# Patient Record
Sex: Male | Born: 1955
Health system: Southern US, Community
[De-identification: ages and names within clinical notes are randomized; demographics above are authoritative.]

## PROBLEM LIST (undated history)

## (undated) DIAGNOSIS — E119 Type 2 diabetes mellitus without complications: Secondary | ICD-10-CM

## (undated) DIAGNOSIS — E785 Hyperlipidemia, unspecified: Secondary | ICD-10-CM

## (undated) DIAGNOSIS — E1140 Type 2 diabetes mellitus with diabetic neuropathy, unspecified: Secondary | ICD-10-CM

## (undated) HISTORY — DX: Type 2 diabetes mellitus with diabetic neuropathy, unspecified: E11.40

## (undated) HISTORY — DX: Hyperlipidemia, unspecified: E78.5

## (undated) HISTORY — DX: Type 2 diabetes mellitus without complications: E11.9

## (undated) HISTORY — PX: CYST REMOVAL TRUNK: SHX6283

---

## 1978-02-10 HISTORY — PX: APPENDECTOMY: SHX54

## 2011-03-25 DIAGNOSIS — E785 Hyperlipidemia, unspecified: Secondary | ICD-10-CM | POA: Insufficient documentation

## 2011-03-25 DIAGNOSIS — E1169 Type 2 diabetes mellitus with other specified complication: Secondary | ICD-10-CM | POA: Insufficient documentation

## 2015-03-27 DIAGNOSIS — E119 Type 2 diabetes mellitus without complications: Secondary | ICD-10-CM | POA: Insufficient documentation

## 2017-03-06 NOTE — Progress Notes (Signed)
Subjective: HU:TMLYYTKPT care, DM2 HPI: Anthony French is a 62 y.o. male presenting to clinic today for:  1. Type 2 Diabetes:  Diagnosed in 2004.  Patient reports: Glucometer: One Touch ultra mini, he has had this for over 2 years, High at home: 230; Low at home: 160, fasting morning blood sugar typically between 160 and 230.  Taking medication(s): Novolin 70/30.  He is injecting 22 units each morning and 24 units each evening, metformin 500 mg twice dailyside effects: None.  No hypoglycemic episodes  Last eye exam: Greater than 3 years Last foot exam: Greater than 1 year Last A1c: Greater than 3 months, unsure of value Nephropathy screen indicated?:  He is not on an ACE or an arm.  He does need a urine microalbumin. Last flu, zoster and/or pneumovax: Unsure.  ROS: denies fever, chills, dizziness, LOC, polyuria, polydipsia, unintended weight loss/gain, foot ulcerations.  He does report numbness/ tingling in extremities. denies chest pain.  2.  Athlete's foot Patient reports that he has been on oral Lamisil for almost a year now.  Unsure of last LFT check.  He reports improvement in the foot infection.  Denies any abdominal pain, nausea, vomiting, jaundice or scleral icterus.  3. Finger problem Patient notes that his middle finger on the left hand and his 2 fingers on his right hand often get stuck such that he has to open his hand back up himself.  He notes that this is painful when this happens.  He reports he frequently works with his hands.  Denies preceding injury, numbness, tingling, swelling.   Past Medical History:  Diagnosis Date  . Diabetes ([place])   . Diabetic neuropathy [facility])    Past Surgical History:  Procedure Laterality Date  . APPENDECTOMY  1980  . CYST REMOVAL TRUNK     Social History   Socioeconomic History  . Marital status: Single    Spouse name: Not on file  . Number of children: Not on file  . Years of education: Not on file  . Highest education level: Not  on file  Social Needs  . Financial resource strain: Not on file  . Food insecurity - worry: Not on file  . Food insecurity - inability: Not on file  . Transportation needs - medical: Not on file  . Transportation needs - non-medical: Not on file  Occupational History  . Not on file  Tobacco Use  . Smoking status: Never Smoker  . Smokeless tobacco: Never Used  Substance and Sexual Activity  . Alcohol use: Not on file  . Drug use: Not on file  . Sexual activity: Not on file  Other Topics Concern  . Not on file  Social History Narrative  . Not on file   Current Meds  Medication Sig  . gabapentin (NEURONTIN) 300 MG capsule Take 2 mg by mouth 3 (three) times daily.   . insulin NPH-regular Human (NOVOLIN 70/30) (70-30) 100 UNIT/ML injection INJECT 22 UNITS UNDER THE SKIN WITH BREAKFAST AND 24 UNITS WITH SUPPER  . metFORMIN (GLUCOPHAGE) 500 MG tablet Take 500 mg by mouth 2 (two) times daily.   . [DISCONTINUED] terbinafine (LAMISIL) 250 MG tablet Take 250 mg by mouth daily.    Family History  Problem Relation Age of Onset  . Stroke Mother   . COPD Father   . Diabetes Sister   . Stroke Brother   . Diabetes Brother    Allergies  Allergen Reactions  . Other Hives    Pt states  that he is allergic to an ABT but he can not remember what it is     ROS: Per HPI  Objective: Office vital signs reviewed. BP 128/78   Pulse 62   Temp (!) 97.1 F (36.2 C) (Oral)   Ht 5' 9"  (1.753 m)   Wt 182 lb (82.6 kg)   BMI 26.88 kg/m   Physical Examination:  General: Awake, alert, well nourished, No acute distress HEENT: MMM, sclera white Cardio: regular rate and rhythm, S1S2 heard, no murmurs appreciated Pulm: clear to auscultation bilaterally, no wheezes, rhonchi or rales; normal work of breathing on room air Extremities: warm, well perfused, No edema, cyanosis or clubbing; +2 pulses bilaterally MSK: normal gait and normal station; left middle finger with palpable nodule within the sheath  along the pulley system.  Finger does not become stuck during our exam today  Skin: dry; intact; no rashes or lesions; mild onychomycosis appreciated at the distal aspects of bilateral great toes.  He has some dry skin along the heel but no cracking or evidence of secondary infection. Neuro: see DM foot exam below Diabetic Foot Form - Detailed   Diabetic Foot Exam - detailed Diabetic Foot exam was performed with the following findings:  Yes 03/13/2017  4:24 PM  Visual Foot Exam completed.:  Yes  Can the patient see the bottom of their feet?:  Yes Are the shoes appropriate in style and fit?:  Yes Is there swelling or and abnormal foot shape?:  No Is there a claw toe deformity?:  No Is there elevated skin temparature?:  No Is there foot or ankle muscle weakness?:  Yes Normal Range of Motion:  No Pulse Foot Exam completed.:  Yes  Right posterior Tibialias:  Diminished Left posterior Tibialias:  Diminished  Right Dorsalis Pedis:  Diminished Left Dorsalis Pedis:  Diminished  Sensory Foot Exam Completed.:  Yes Semmes-Weinstein Monofilament Test R Site 1-Great Toe:  Pos L Site 1-Great Toe:  Pos        Psych: Mood stable, speech normal, affect appropriate, pleasant Depression screen PHQ 2/9 03/13/2017  Decreased Interest 0  Down, Depressed, Hopeless 0  PHQ - 2 Score 0    Assessment/ Plan: 62 y.o. male   1. Type 2 diabetes mellitus with hyperglycemia, with long-term current use of insulin (HCC) Likely uncontrolled, given reports of blood sugars 160s-200s.  He has only recently become compliant with insulin regimen.  If he has normal renal function, we will plan to increase the metformin to 1000 mg twice daily prior to increasing insulin if able.  Patient seemed amenable to this.  Check CMP, lipid panel, A1c.  Patient to add a daily aspirin.  Lipitor 40 mg also added to regimen.  Diabetic shoes prescribed.  Patient will bring Rx to pharmacy to fill.  He will follow-up in 1 month for continued  management of diabetes.  For now, continue monitoring blood sugar twice a day and record.  He will bring this to next visit.  New meter and strips prescribed. - CMP14+EGFR - Lipid Panel - Bayer DCA Hb A1c Waived  2. Encounter to establish care with new doctor  3. Foot callus See above  4. Diabetic polyneuropathy associated with type 2 diabetes mellitus ([place]) See above.  He is already on Neurontin 300 mg 3 times daily  5. Screening for HIV without presence of risk factors - HIV antibody  6. Encounter for hepatitis C screening test for low risk patient - Hepatitis C antibody  7. Screening for  deficiency anemia - CBC with Differential  8. Tinea pedis of both feet He reports having been on Lamisil for greater than 1 year.  We discussed that typical treatment intervals is no longer than 3 months.  He certainly does not sound like he has been having regular CMP/liver function tests.  I advised that he discontinue use of Lamisil.  Will check CMP today.  9. Trigger middle finger of left hand Clinically consistent with trigger finger.  We discussed that treatment could entail corticosteroid injection.  I did inform him that I do not perform this procedure but I am happy to refer him to orthopedic or another provider within our office who may perform this.  He voices good understanding and will follow-up if needed for this.  Janora Norlander, DO [place] [phone]

## 2017-03-13 ENCOUNTER — Ambulatory Visit: Payer: PRIVATE HEALTH INSURANCE | Admitting: Family Medicine

## 2017-03-13 ENCOUNTER — Encounter: Payer: Self-pay | Admitting: Family Medicine

## 2017-03-13 VITALS — BP 128/78 | HR 62 | Temp 97.1°F | Ht 69.0 in | Wt 182.0 lb

## 2017-03-13 DIAGNOSIS — M65332 Trigger finger, left middle finger: Secondary | ICD-10-CM | POA: Insufficient documentation

## 2017-03-13 DIAGNOSIS — Z1159 Encounter for screening for other viral diseases: Secondary | ICD-10-CM | POA: Diagnosis not present

## 2017-03-13 DIAGNOSIS — E1142 Type 2 diabetes mellitus with diabetic polyneuropathy: Secondary | ICD-10-CM | POA: Diagnosis not present

## 2017-03-13 DIAGNOSIS — Z7689 Persons encountering health services in other specified circumstances: Secondary | ICD-10-CM

## 2017-03-13 DIAGNOSIS — B353 Tinea pedis: Secondary | ICD-10-CM | POA: Diagnosis not present

## 2017-03-13 DIAGNOSIS — L84 Corns and callosities: Secondary | ICD-10-CM

## 2017-03-13 DIAGNOSIS — Z114 Encounter for screening for human immunodeficiency virus [HIV]: Secondary | ICD-10-CM

## 2017-03-13 DIAGNOSIS — Z794 Long term (current) use of insulin: Secondary | ICD-10-CM | POA: Diagnosis not present

## 2017-03-13 DIAGNOSIS — Z130 Encounter for screening for diseases of the blood and blood-forming organs and certain disorders involving the immune mechanism: Secondary | ICD-10-CM

## 2017-03-13 DIAGNOSIS — N289 Disorder of kidney and ureter, unspecified: Secondary | ICD-10-CM

## 2017-03-13 DIAGNOSIS — E1165 Type 2 diabetes mellitus with hyperglycemia: Secondary | ICD-10-CM

## 2017-03-13 LAB — BAYER DCA HB A1C WAIVED: HB A1C (BAYER DCA - WAIVED): 9.6 % — ABNORMAL HIGH (ref <7.0)

## 2017-03-13 MED ORDER — BLOOD GLUCOSE METER KIT: PACK | 0 refills | Status: AC

## 2017-03-13 MED ORDER — ATORVASTATIN CALCIUM 40 MG PO TABS
40.0000 mg | ORAL_TABLET | Freq: Every day | ORAL | 5 refills | Status: DC
Start: 2017-03-13 — End: 2017-10-29

## 2017-03-13 NOTE — Assessment & Plan Note (Signed)
Likely uncontrolled, given reports of blood sugars 160s-200s.  He has only recently become compliant with insulin regimen.  If he has normal renal function, we will plan to increase the metformin to 1000 mg twice daily prior to increasing insulin if able.  Patient seemed amenable to this.  For now continue current regimen.  Check CMP, lipid panel, A1c.  Patient to add a daily aspirin.  Lipitor 40 mg also added to regimen.  Diabetic shoes prescribed.  Patient will bring Rx to pharmacy to fill.  He will follow-up in 1 month for continued management of diabetes.  For now, continue monitoring blood sugar twice a day and record.  He will bring this to next visit.  New meter and strips prescribed.

## 2017-03-13 NOTE — Patient Instructions (Signed)
Plan to see me back in about 4 weeks for your blood sugar.  In the meantime make sure that you monitor your blood pressure twice daily as we discussed.  I have added cholesterol medication.  Please pick this up from the pharmacy.  I will contact you with results of your blood labs once they are available.   Diabetes and Foot Care Diabetes may cause you to have problems because of poor blood supply (circulation) to your feet and legs. This may cause the skin on your feet to become thinner, break easier, and heal more slowly. Your skin may become dry, and the skin may peel and crack. You may also have nerve damage in your legs and feet causing decreased feeling in them. You may not notice minor injuries to your feet that could lead to infections or more serious problems. Taking care of your feet is one of the most important things you can do for yourself. Follow these instructions at home:  Wear shoes at all times, even in the house. Do not go barefoot. Bare feet are easily injured.  Check your feet daily for blisters, cuts, and redness. If you cannot see the bottom of your feet, use a mirror or ask someone for help.  Wash your feet with warm water (do not use hot water) and mild soap. Then pat your feet and the areas between your toes until they are completely dry. Do not soak your feet as this can dry your skin.  Apply a moisturizing lotion or petroleum jelly (that does not contain alcohol and is unscented) to the skin on your feet and to dry, brittle toenails. Do not apply lotion between your toes.  Trim your toenails straight across. Do not dig under them or around the cuticle. File the edges of your nails with an emery board or nail file.  Do not cut corns or calluses or try to remove them with medicine.  Wear clean socks or stockings every day. Make sure they are not too tight. Do not wear knee-high stockings since they may decrease blood flow to your legs.  Wear shoes that fit properly  and have enough cushioning. To break in new shoes, wear them for just a few hours a day. This prevents you from injuring your feet. Always look in your shoes before you put them on to be sure there are no objects inside.  Do not cross your legs. This may decrease the blood flow to your feet.  If you find a minor scrape, cut, or break in the skin on your feet, keep it and the skin around it clean and dry. These areas may be cleansed with mild soap and water. Do not cleanse the area with peroxide, alcohol, or iodine.  When you remove an adhesive bandage, be sure not to damage the skin around it.  If you have a wound, look at it several times a day to make sure it is healing.  Do not use heating pads or hot water bottles. They may burn your skin. If you have lost feeling in your feet or legs, you may not know it is happening until it is too late.  Make sure your health care provider performs a complete foot exam at least annually or more often if you have foot problems. Report any cuts, sores, or bruises to your health care provider immediately. Contact a health care provider if:  You have an injury that is not healing.  You have cuts or breaks in  the skin.  You have an ingrown nail.  You notice redness on your legs or feet.  You feel burning or tingling in your legs or feet.  You have pain or cramps in your legs and feet.  Your legs or feet are numb.  Your feet always feel cold. Get help right away if:  There is increasing redness, swelling, or pain in or around a wound.  There is a red line that goes up your leg.  Pus is coming from a wound.  You develop a fever or as directed by your health care provider.  You notice a bad smell coming from an ulcer or wound. This information is not intended to replace advice given to you by your health care provider. Make sure you discuss any questions you have with your health care provider. Document Released: 01/25/2000 Document Revised:  07/05/2015 Document Reviewed: 07/06/2012 Elsevier Interactive Patient Education  2017 [employer].

## 2017-03-14 LAB — CMP14+EGFR
A/G RATIO: 2.1 (ref 1.2–2.2)
ALK PHOS: 82 IU/L (ref 39–117)
ALT: 14 IU/L (ref 0–44)
AST: 14 IU/L (ref 0–40)
Albumin: 4.2 g/dL (ref 3.6–4.8)
BUN/Creatinine Ratio: 16 (ref 10–24)
BUN: 22 mg/dL (ref 8–27)
Bilirubin Total: 0.4 mg/dL (ref 0.0–1.2)
CALCIUM: 9 mg/dL (ref 8.6–10.2)
CO2: 21 mmol/L (ref 20–29)
Chloride: 105 mmol/L (ref 96–106)
Creatinine, Ser: 1.37 mg/dL — ABNORMAL HIGH (ref 0.76–1.27)
GFR calc Af Amer: 63 mL/min/{1.73_m2} (ref >59)
GFR, EST NON AFRICAN AMERICAN: 55 mL/min/{1.73_m2} — AB (ref >59)
Globulin, Total: 2 g/dL (ref 1.5–4.5)
Glucose: 242 mg/dL — ABNORMAL HIGH (ref 65–99)
POTASSIUM: 5.2 mmol/L (ref 3.5–5.2)
SODIUM: 142 mmol/L (ref 134–144)
Total Protein: 6.2 g/dL (ref 6.0–8.5)

## 2017-03-14 LAB — CBC WITH DIFFERENTIAL/PLATELET
BASOS ABS: 0 10*3/uL (ref 0.0–0.2)
BASOS: 0 %
EOS (ABSOLUTE): 0.1 10*3/uL (ref 0.0–0.4)
EOS: 4 %
HEMATOCRIT: 42.5 % (ref 37.5–51.0)
HEMOGLOBIN: 14.4 g/dL (ref 13.0–17.7)
Immature Grans (Abs): 0 10*3/uL (ref 0.0–0.1)
Immature Granulocytes: 0 %
Lymphocytes Absolute: 1 10*3/uL (ref 0.7–3.1)
Lymphs: 29 %
MCH: 29 pg (ref 26.6–33.0)
MCHC: 33.9 g/dL (ref 31.5–35.7)
MCV: 86 fL (ref 79–97)
MONOCYTES: 8 %
Monocytes Absolute: 0.3 10*3/uL (ref 0.1–0.9)
NEUTROS ABS: 2.1 10*3/uL (ref 1.4–7.0)
Neutrophils: 59 %
Platelets: 124 10*3/uL — ABNORMAL LOW (ref 150–379)
RBC: 4.97 x10E6/uL (ref 4.14–5.80)
RDW: 14.4 % (ref 12.3–15.4)
WBC: 3.5 10*3/uL (ref 3.4–10.8)

## 2017-03-14 LAB — LIPID PANEL
CHOL/HDL RATIO: 4 ratio (ref 0.0–5.0)
CHOLESTEROL TOTAL: 166 mg/dL (ref 100–199)
HDL: 42 mg/dL (ref >39)
LDL Calculated: 99 mg/dL (ref 0–99)
TRIGLYCERIDES: 123 mg/dL (ref 0–149)
VLDL Cholesterol Cal: 25 mg/dL (ref 5–40)

## 2017-03-14 LAB — HEPATITIS C ANTIBODY

## 2017-03-14 LAB — HIV ANTIBODY (ROUTINE TESTING W REFLEX): HIV Screen 4th Generation wRfx: NONREACTIVE

## 2017-03-23 ENCOUNTER — Other Ambulatory Visit: Payer: Self-pay | Admitting: Family Medicine

## 2017-03-23 DIAGNOSIS — R7989 Other specified abnormal findings of blood chemistry: Secondary | ICD-10-CM

## 2017-03-23 NOTE — Addendum Note (Signed)
Addended byCarrolyn Leigh on: 03/23/2017 02:40 PM   Modules accepted: Orders

## 2017-04-14 NOTE — Progress Notes (Signed)
Subjective: CC: DM2 HPI: Anthony French is a 62 y.o. male presenting to clinic today for:  1. Type 2 Diabetes/ HLD:  Patient reports: Glucometer: Accucheck, High at home: 281; Low at home: 88, Taking medication(s): Metformin 500 mg p.o. twice daily and Novolin 70/30 22 units every morning and 25 units nightly, he notes good tolerance of the Lipitor which was added at last visit.  Side effects: none  Last eye exam: Needs to schedule Last foot exam: 03/2017 Last A1c: 9.6 last month Nephropathy screen indicated?: Needs Last flu, zoster and/or pneumovax: Needs PNA  Denies fever, chills, dizziness, LOC, polyuria, polydipsia, unintended weight loss/gain, foot ulcerations, or chest pain. He reports numbness or tingling in extremities.  He notes that he has not yet pick up his diabetic shoes but is planning on going today after our appointment.  2. Skin lesions Patient reports a crusty skin lesion on the right anterior and left posterior aspects of the neck.  He denies increasing in size, spontaneous bleeding, change in color, shape.  He notes that he has had similar lesions on his forearms that were freeze off in the past and is wondering if we can do that for him today.  3.  Colon cancer screening Patient has not had colon cancer screening performed.  Denies any rectal bleeding.  No unplanned weight loss.  He is a "picky eater" at baseline.  ROS: Per HPI  Past Medical History:  Diagnosis Date  . Diabetes ([place])   . Diabetic neuropathy (HCC)    Allergies  Allergen Reactions  . Other Hives    Pt states that he is allergic to an ABT but he can not remember what it is    Current Outpatient Medications:  .  atorvastatin (LIPITOR) 40 MG tablet, Take 1 tablet (40 mg total) by mouth daily., Disp: 30 tablet, Rfl: 5 .  blood glucose meter kit and supplies, Dispense based on patient and insurance preference. Use up to four times daily as directed. (FOR ICD-10 E10.9, E11.9). Check blood sugar twice  a day as directed., Disp: 1 each, Rfl: 0 .  gabapentin (NEURONTIN) 300 MG capsule, Take 2 mg by mouth 3 (three) times daily. , Disp: , Rfl:  .  insulin NPH-regular Human (NOVOLIN 70/30) (70-30) 100 UNIT/ML injection, INJECT 22 UNITS UNDER THE SKIN WITH BREAKFAST AND 24 UNITS WITH SUPPER, Disp: , Rfl:  .  metFORMIN (GLUCOPHAGE) 500 MG tablet, Take 500 mg by mouth 2 (two) times daily. , Disp: , Rfl:  Social History   Socioeconomic History  . Marital status: Single    Spouse name: Not on file  . Number of children: Not on file  . Years of education: Not on file  . Highest education level: Not on file  Social Needs  . Financial resource strain: Not on file  . Food insecurity - worry: Not on file  . Food insecurity - inability: Not on file  . Transportation needs - medical: Not on file  . Transportation needs - non-medical: Not on file  Occupational History  . Not on file  Tobacco Use  . Smoking status: Never Smoker  . Smokeless tobacco: Never Used  Substance and Sexual Activity  . Alcohol use: Not on file  . Drug use: Not on file  . Sexual activity: Not on file  Other Topics Concern  . Not on file  Social History Narrative  . Not on file   Family History  Problem Relation Age of Onset  . Stroke  Mother   . COPD Father   . Diabetes Sister   . Stroke Brother   . Diabetes Brother     Health Maintenance: colon cancer screening.  Objective: Office vital signs reviewed. BP 109/64   Pulse 61   Temp (!) 97.5 F (36.4 C) (Oral)   Ht 5' 9"  (1.753 m)   Wt 185 lb (83.9 kg)   BMI 27.32 kg/m   Physical Examination:  General: Awake, alert, thin male, No acute distress HEENT: sclera white, MMM Cardio: regular rate and rhythm, S1S2 heard, no murmurs appreciated Pulm: clear to auscultation bilaterally, no wheezes, rhonchi or rales; normal work of breathing on room air Skin: 1.75 centimeter x 1.25 centimeter crusting lesion that has no pigment and no associated erythema appreciated  on the right anterior aspect of the patient's neck.  A similar lesion that is 2 millimeters x 3 mm on the left posterior aspect of the neck noted as well.  No associated induration, exudate or bleeding.  Cryotherapy Procedure:  Risks and benefits of procedure were reviewed with the patient.  Written consent obtained and scanned into the chart.  Lesion of concern was identified and located on right anterior.  Liquid nitrogen was applied to area of concern and extending out 1.5 millimeters beyond the border of the lesion.  Treated area was allowed to come back to room temperature before treating it a second time.  Patient tolerated procedure well and there were no immediate complications.  Home care instructions were reviewed with the patient and a handout was provided.  Cryotherapy Procedure:  Risks and benefits of procedure were reviewed with the patient.  Written consent obtained and scanned into the chart.  Lesion of concern was identified and located on left posterior.  Liquid nitrogen was applied to area of concern and extending out 0.5 millimeters beyond the border of the lesion.  Treated area was allowed to come back to room temperature before treating it a second time.  Patient tolerated procedure well and there were no immediate complications.  Home care instructions were reviewed with the patient and a handout was provided.  Assessment/ Plan: 62 y.o. male   Diabetes mellitus ([place]) We discussed consideration for splitting up basal and short acting insulin.  Patient would like to wait until his next A1c check prior to doing this.  We could also consider increasing metformin to 1000 mg p.o. twice daily to get better control of his blood sugars.  Patient will continue to monitor his blood sugars as directed.  Pneumonia vaccine administered today.  Patient to pick up diabetic shoes.  He will schedule his eye exam as well.  Follow-up in 2 months for recheck of A1c.  Actinic keratosis Cryotherapy  used today to freeze lesions off.  Patient tolerated the procedure well.  No immediate complications.  Home care instructions reviewed with the patient.  Dyslipidemia Tolerating statin without difficulty.  Colon cancer screening: Cologuard  ordered.  Will follow-up results once they are available.  Janora Norlander, DO [place] [phone]

## 2017-04-16 ENCOUNTER — Encounter: Payer: Self-pay | Admitting: Family Medicine

## 2017-04-16 ENCOUNTER — Ambulatory Visit: Payer: PRIVATE HEALTH INSURANCE | Admitting: Family Medicine

## 2017-04-16 VITALS — BP 109/64 | HR 61 | Temp 97.5°F | Ht 69.0 in | Wt 185.0 lb

## 2017-04-16 DIAGNOSIS — Z23 Encounter for immunization: Secondary | ICD-10-CM | POA: Diagnosis not present

## 2017-04-16 DIAGNOSIS — L570 Actinic keratosis: Secondary | ICD-10-CM | POA: Insufficient documentation

## 2017-04-16 DIAGNOSIS — Z794 Long term (current) use of insulin: Secondary | ICD-10-CM

## 2017-04-16 DIAGNOSIS — E785 Hyperlipidemia, unspecified: Secondary | ICD-10-CM | POA: Diagnosis not present

## 2017-04-16 DIAGNOSIS — E1165 Type 2 diabetes mellitus with hyperglycemia: Secondary | ICD-10-CM | POA: Diagnosis not present

## 2017-04-16 NOTE — Assessment & Plan Note (Signed)
We discussed consideration for splitting up basal and short acting insulin.  Patient would like to wait until his next A1c check prior to doing this.  We could also consider increasing metformin to 1000 mg p.o. twice daily to get better control of his blood sugars.  Patient will continue to monitor his blood sugars as directed.  Follow-up in 2 months for recheck of A1c.

## 2017-04-16 NOTE — Assessment & Plan Note (Signed)
Cryotherapy used today to freeze lesions off.  Patient tolerated the procedure well.  No immediate complications.  Home care instructions reviewed with the patient.

## 2017-04-16 NOTE — Patient Instructions (Addendum)
Remember to schedule your Diabetic Eye exam.  I have ordered a Cologuard for colon cancer screening for you today.  We will plan to check your A1c in 2 months.  Your last A1c was 9.6.  Continue to monitor your blood sugars as we discussed.  Take your medication as directed.    Understanding your Hemoglobin A1c:     Diabetes Mellitus and Nutrition When you have diabetes (diabetes mellitus), it is very important to have healthy eating habits because your blood sugar (glucose) levels are greatly affected by what you eat and drink. Eating healthy foods in the appropriate amounts, at about the same times every day, can help you:  Control your blood glucose.  Lower your risk of heart disease.  Improve your blood pressure.  Reach or maintain a healthy weight.  Every person with diabetes is different, and each person has different needs for a meal plan. Your health care provider may recommend that you work with a diet and nutrition specialist (dietitian) to make a meal plan that is best for you. Your meal plan may vary depending on factors such as:  The calories you need.  The medicines you take.  Your weight.  Your blood glucose, blood pressure, and cholesterol levels.  Your activity level.  Other health conditions you have, such as heart or kidney disease.  How do carbohydrates affect me? Carbohydrates affect your blood glucose level more than any other type of food. Eating carbohydrates naturally increases the amount of glucose in your blood. Carbohydrate counting is a method for keeping track of how many carbohydrates you eat. Counting carbohydrates is important to keep your blood glucose at a healthy level, especially if you use insulin or take certain oral diabetes medicines. It is important to know how many carbohydrates you can safely have in each meal. This is different for every person. Your dietitian can help you calculate how many carbohydrates you should have at each meal  and for snack. Foods that contain carbohydrates include:  Bread, cereal, rice, pasta, and crackers.  Potatoes and corn.  Peas, beans, and lentils.  Milk and yogurt.  Fruit and juice.  Desserts, such as cakes, cookies, ice cream, and candy.  How does alcohol affect me? Alcohol can cause a sudden decrease in blood glucose (hypoglycemia), especially if you use insulin or take certain oral diabetes medicines. Hypoglycemia can be a life-threatening condition. Symptoms of hypoglycemia (sleepiness, dizziness, and confusion) are similar to symptoms of having too much alcohol. If your health care provider says that alcohol is safe for you, follow these guidelines:  Limit alcohol intake to no more than 1 drink per day for nonpregnant women and 2 drinks per day for men. One drink equals 12 oz of beer, 5 oz of wine, or 1 oz of hard liquor.  Do not drink on an empty stomach.  Keep yourself hydrated with water, diet soda, or unsweetened iced tea.  Keep in mind that regular soda, juice, and other mixers may contain a lot of sugar and must be counted as carbohydrates.  What are tips for following this plan? Reading food labels  Start by checking the serving size on the label. The amount of calories, carbohydrates, fats, and other nutrients listed on the label are based on one serving of the food. Many foods contain more than one serving per package.  Check the total grams (g) of carbohydrates in one serving. You can calculate the number of servings of carbohydrates in one serving by dividing the  total carbohydrates by 15. For example, if a food has 30 g of total carbohydrates, it would be equal to 2 servings of carbohydrates.  Check the number of grams (g) of saturated and trans fats in one serving. Choose foods that have low or no amount of these fats.  Check the number of milligrams (mg) of sodium in one serving. Most people should limit total sodium intake to less than 2,300 mg per  day.  Always check the nutrition information of foods labeled as "low-fat" or "nonfat". These foods may be higher in added sugar or refined carbohydrates and should be avoided.  Talk to your dietitian to identify your daily goals for nutrients listed on the label. Shopping  Avoid buying canned, premade, or processed foods. These foods tend to be high in fat, sodium, and added sugar.  Shop around the outside edge of the grocery store. This includes fresh fruits and vegetables, bulk grains, fresh meats, and fresh dairy. Cooking  Use low-heat cooking methods, such as baking, instead of high-heat cooking methods like deep frying.  Cook using healthy oils, such as olive, canola, or sunflower oil.  Avoid cooking with butter, cream, or high-fat meats. Meal planning  Eat meals and snacks regularly, preferably at the same times every day. Avoid going long periods of time without eating.  Eat foods high in fiber, such as fresh fruits, vegetables, beans, and whole grains. Talk to your dietitian about how many servings of carbohydrates you can eat at each meal.  Eat 4-6 ounces of lean protein each day, such as lean meat, chicken, fish, eggs, or tofu. 1 ounce is equal to 1 ounce of meat, chicken, or fish, 1 egg, or 1/4 cup of tofu.  Eat some foods each day that contain healthy fats, such as avocado, nuts, seeds, and fish. Lifestyle   Check your blood glucose regularly.  Exercise at least 30 minutes 5 or more days each week, or as told by your health care provider.  Take medicines as told by your health care provider.  Do not use any products that contain nicotine or tobacco, such as cigarettes and e-cigarettes. If you need help quitting, ask your health care provider.  Work with a Social worker or diabetes educator to identify strategies to manage stress and any emotional and social challenges. What are some questions to ask my health care provider?  Do I need to meet with a diabetes  educator?  Do I need to meet with a dietitian?  What number can I call if I have questions?  When are the best times to check my blood glucose? Where to find more information:  American Diabetes Association: diabetes.org/food-and-fitness/food  Academy of Nutrition and Dietetics: PokerClues.dk  [employer] of Diabetes and Digestive and Kidney Diseases (NIH): ContactWire.be Summary  A healthy meal plan will help you control your blood glucose and maintain a healthy lifestyle.  Working with a diet and nutrition specialist (dietitian) can help you make a meal plan that is best for you.  Keep in mind that carbohydrates and alcohol have immediate effects on your blood glucose levels. It is important to count carbohydrates and to use alcohol carefully. This information is not intended to replace advice given to you by your health care provider. Make sure you discuss any questions you have with your health care provider. Document Released: 10/24/2004 Document Revised: 03/03/2016 Document Reviewed: 03/03/2016 Elsevier Interactive Patient Education  [employer].

## 2017-04-16 NOTE — Assessment & Plan Note (Signed)
Tolerating statin without difficulty.

## 2017-04-17 LAB — MICROALBUMIN / CREATININE URINE RATIO
CREATININE, UR: 24.2 mg/dL
Microalb/Creat Ratio: 14.9 mg/g creat (ref 0.0–30.0)
Microalbumin, Urine: 3.6 ug/mL

## 2017-05-05 ENCOUNTER — Other Ambulatory Visit: Payer: Self-pay | Admitting: Family Medicine

## 2017-06-11 ENCOUNTER — Other Ambulatory Visit: Payer: Self-pay | Admitting: Family Medicine

## 2017-06-12 ENCOUNTER — Telehealth: Payer: Self-pay | Admitting: Family Medicine

## 2017-06-12 MED ORDER — INSULIN NPH ISOPHANE & REGULAR (70-30) 100 UNIT/ML ~~LOC~~ SUSP: SUBCUTANEOUS | 3 refills | Status: DC

## 2017-06-12 NOTE — Telephone Encounter (Signed)
Refill sent for pt

## 2017-06-15 ENCOUNTER — Telehealth: Payer: Self-pay

## 2017-06-15 NOTE — Telephone Encounter (Signed)
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 First reading was 155/105 and then second was 149/96.  They stated that he needed to get seen. Stated that he has had a headache and swelling in his right eye x 2 days.   Spoke with Glenard Haring who was night provider and she states that patient could wait to get seen tomorrow unless he gets worse he needs to go to the ER.  Apt made with Dr. Darnell Level 5/7 at 10:30 am- advise if he gets worse or bp keeps going up to go to the ER. Verbalized understanding.

## 2017-06-16 ENCOUNTER — Encounter: Payer: Self-pay | Admitting: Family Medicine

## 2017-06-16 ENCOUNTER — Ambulatory Visit: Payer: PRIVATE HEALTH INSURANCE | Admitting: Family Medicine

## 2017-06-16 VITALS — BP 122/84 | HR 65 | Temp 97.7°F | Ht 69.0 in | Wt 192.0 lb

## 2017-06-16 DIAGNOSIS — N183 Chronic kidney disease, stage 3 (moderate): Secondary | ICD-10-CM

## 2017-06-16 DIAGNOSIS — R030 Elevated blood-pressure reading, without diagnosis of hypertension: Secondary | ICD-10-CM | POA: Diagnosis not present

## 2017-06-16 DIAGNOSIS — Z794 Long term (current) use of insulin: Secondary | ICD-10-CM | POA: Diagnosis not present

## 2017-06-16 DIAGNOSIS — R7989 Other specified abnormal findings of blood chemistry: Secondary | ICD-10-CM

## 2017-06-16 DIAGNOSIS — E1122 Type 2 diabetes mellitus with diabetic chronic kidney disease: Secondary | ICD-10-CM

## 2017-06-16 NOTE — Patient Instructions (Signed)
Your blood pressure is normal during today's visit.  We discussed that blood pressure sometimes can be elevated when urine pain.  Working to recheck your kidney function today and if we still see that it is impaired, I may just start you on lisinopril 2.5 mg to cover your kidneys.  This medication is a blood pressure medication as well and can cause the blood pressure to go down.  I will contact you tomorrow with the results of your labs and let you know for going to start this medicine.  In the meantime, I want you to monitor your blood pressure like we discussed.  Avoid medicines like ibuprofen and Aleve.  He may use Tylenol if needed for back pain.   How to Take Your Blood Pressure You can take your blood pressure at home with a machine. You may need to check your blood pressure at home:  To check if you have high blood pressure (hypertension).  To check your blood pressure over time.  To make sure your blood pressure medicine is working.  Supplies needed: You will need a blood pressure machine, or monitor. You can buy one at a drugstore or online. When choosing one:  Choose one with an arm cuff.  Choose one that wraps around your upper arm. Only one finger should fit between your arm and the cuff.  Do not choose one that measures your blood pressure from your wrist or finger.  Your doctor can suggest a monitor. How to prepare Avoid these things for 30 minutes before checking your blood pressure:  Drinking caffeine.  Drinking alcohol.  Eating.  Smoking.  Exercising.  Five minutes before checking your blood pressure:  Pee.  Sit in a dining chair. Avoid sitting in a soft couch or armchair.  Be quiet. Do not talk.  How to take your blood pressure Follow the instructions that came with your machine. If you have a digital blood pressure monitor, these may be the instructions: 1. Sit up straight. 2. Place your feet on the floor. Do not cross your ankles or legs. 3. Rest  your left arm at the level of your heart. You may rest it on a table, desk, or chair. 4. Pull up your shirt sleeve. 5. Wrap the blood pressure cuff around the upper part of your left arm. The cuff should be 1 inch (2.5 cm) above your elbow. It is best to wrap the cuff around bare skin. 6. Fit the cuff snugly around your arm. You should be able to place only one finger between the cuff and your arm. 7. Put the cord inside the groove of your elbow. 8. Press the power button. 9. Sit quietly while the cuff fills with air and loses air. 10. Write down the numbers on the screen. 11. Wait 2-3 minutes and then repeat steps 1-10.  What do the numbers mean? Two numbers make up your blood pressure. The first number is called systolic pressure. The second is called diastolic pressure. An example of a blood pressure reading is "120 over 80" (or 120/80). If you are an adult and do not have a medical condition, use this guide to find out if your blood pressure is normal: Normal  First number: below 120.  Second number: below 80. Elevated  First number: 120-129.  Second number: below 80. Hypertension stage 1  First number: 130-139.  Second number: 80-89. Hypertension stage 2  First number: 140 or above.  Second number: 38 or above. Your blood pressure is  above normal even if only the top or bottom number is above normal. Follow these instructions at home:  Check your blood pressure as often as your doctor tells you to.  Take your monitor to your next doctor's appointment. Your doctor will: ? Make sure you are using it correctly. ? Make sure it is working right.  Make sure you understand what your blood pressure numbers should be.  Tell your doctor if your medicines are causing side effects. Contact a doctor if:  Your blood pressure keeps being high. Get help right away if:  Your first blood pressure number is higher than 180.  Your second blood pressure number is higher than  120. This information is not intended to replace advice given to you by your health care provider. Make sure you discuss any questions you have with your health care provider. Document Released: 01/10/2008 Document Revised: 12/26/2015 Document Reviewed: 07/06/2015 Elsevier Interactive Patient Education  [employer].

## 2017-06-16 NOTE — Progress Notes (Signed)
Subjective: CC: elevated Bps PCP: Janora Norlander, DO BUL:AGTXM Anthony French is a 62 y.o. male presenting to clinic today for:  1. Elevated BP Patient reports that he was seen by the nurse at work and noted to have a blood pressure of 150s over 100s.  He reports that he has been having a headache since Saturday evening and back pain.  He notes that headache was associated with photophobia.  Symptoms have since resolved.  He does have "light back pain" now but overall he is feeling better.  He did take ibuprofen last evening.  He brings in blood pressure readings that were 200s over 100s from last evening during his pain episode.  He was monitoring his blood pressure with a borrowed blood pressure wrist cuff.  Denies any chest pain, shortness of breath, dizziness, visual disturbance.  He has plans to see his eye doctor but was told that he could not use his medical insurance to see the eye doctor.  His spouse is with him today who notes that he has had slight increased redness in the right eye with associated swelling.  She thinks this is looking better today but wanted to have this checked out.  Patient notes that he was mowing grass and got stuff in his right eye and has been rubbing it.   ROS: Per HPI  Allergies  Allergen Reactions  . Other Hives    Pt states that he is allergic to an ABT but he can not remember what it is   Past Medical History:  Diagnosis Date  . Diabetes ([place])   . Diabetic neuropathy ([place])     Current Outpatient Medications:  .  ACCU-CHEK GUIDE test strip, TEST TWICE DAILY OR AS DIRECTED, Disp: 100 each, Rfl: 1 .  atorvastatin (LIPITOR) 40 MG tablet, Take 1 tablet (40 mg total) by mouth daily., Disp: 30 tablet, Rfl: 5 .  blood glucose meter kit and supplies, Dispense based on patient and insurance preference. Use up to four times daily as directed. (FOR ICD-10 E10.9, E11.9). Check blood sugar twice a day as directed., Disp: 1 each, Rfl: 0 .  gabapentin (NEURONTIN) 300  MG capsule, Take 2 mg by mouth 3 (three) times daily. , Disp: , Rfl:  .  insulin NPH-regular Human (NOVOLIN 70/30) (70-30) 100 UNIT/ML injection, INJECT 22 UNITS UNDER THE SKIN WITH BREAKFAST AND 24 UNITS WITH SUPPER, Disp: 10 mL, Rfl: 3 .  metFORMIN (GLUCOPHAGE) 500 MG tablet, Take 500 mg by mouth 2 (two) times daily. , Disp: , Rfl:  Social History   Socioeconomic History  . Marital status: Single    Spouse name: Not on file  . Number of children: Not on file  . Years of education: Not on file  . Highest education level: Not on file  Occupational History  . Not on file  Social Needs  . Financial resource strain: Not on file  . Food insecurity:    Worry: Not on file    Inability: Not on file  . Transportation needs:    Medical: Not on file    Non-medical: Not on file  Tobacco Use  . Smoking status: Never Smoker  . Smokeless tobacco: Never Used  Substance and Sexual Activity  . Alcohol use: Not on file  . Drug use: Not on file  . Sexual activity: Not on file  Lifestyle  . Physical activity:    Days per week: Not on file    Minutes per session: Not on file  .  Stress: Not on file  Relationships  . Social connections:    Talks on phone: Not on file    Gets together: Not on file    Attends religious service: Not on file    Active member of club or organization: Not on file    Attends meetings of clubs or organizations: Not on file    Relationship status: Not on file  . Intimate partner violence:    Fear of current or ex partner: Not on file    Emotionally abused: Not on file    Physically abused: Not on file    Forced sexual activity: Not on file  Other Topics Concern  . Not on file  Social History Narrative  . Not on file   Family History  Problem Relation Age of Onset  . Stroke Mother   . COPD Father   . Diabetes Sister   . Stroke Brother   . Diabetes Brother     Objective: Office vital signs reviewed. BP 122/84   Pulse 65   Temp 97.7 F (36.5 C) (Oral)    Ht _0  (1.753 m)   Wt 192 lb (87.1 kg)   BMI 28.35 kg/m   Physical Examination:  General: Awake, alert, well nourished, well appearing, No acute distress HEENT: Normal    Eyes: PERRLA, extraocular membranes intact, sclera slightly injected in the right.  No pain with EOM. No tearing/ discharge. No swelling appreciated. Cardio: regular rate and rhythm, S1S2 heard, no murmurs appreciated Pulm: clear to auscultation bilaterally, no wheezes, rhonchi or rales; normal work of breathing on room air Extremities: warm, well perfused, No edema, cyanosis or clubbing; +2 pulses bilaterally MSK: normal gait and normal station Neuro: AOx3. No focal neurologic deficits  Assessment/ Plan: 62 y.o. male   1. Elevated blood-pressure reading, without diagnosis of hypertension Currently blood pressure is within normal limits.  I do suspect that the blood pressure reading was likely elevated because of his pain.  Symptoms have essentially resolved and therefore now blood pressure is looking better.  He did have slight renal impairment on last CMP.  Will check BMP.  We may consider adding an ACE inhibitor for renal protection if he has true CKD.  We discussed this during today's visit.  We will likely add lisinopril 2.5 mg if this is the case.  Will contact patient with results and plan for repeat BMP later this week if we start the ACE inhibitor.  Instructions for blood pressure monitoring at home reviewed.  I did advise that they consider using a cuff rather than a wrist blood pressure monitor. - Ambulatory referral to Optometry  2. Type 2 diabetes mellitus with stage 3 chronic kidney disease, with long-term current use of insulin (HCC) Is having trouble getting his diabetic eye exam.  Will place referral and see if we can get his medical insurance to cover this given that he is got diabetes with what looks to be chronic kidney disease stage III. - Ambulatory referral to Optometry  3. Elevated serum  creatinine - Basic Metabolic Panel   Orders Placed This Encounter  Procedures  . Ambulatory referral to Optometry    Referral Priority:   Routine    Referral Type:   [facility] planner)    Referral Reason:   Specialty Services Required    Requested Specialty:   Optometry    Number of Visits Requested:   [place], [place] [phone]

## 2017-06-17 ENCOUNTER — Other Ambulatory Visit: Payer: Self-pay | Admitting: Family Medicine

## 2017-06-17 DIAGNOSIS — N183 Chronic kidney disease, stage 3 unspecified: Secondary | ICD-10-CM | POA: Insufficient documentation

## 2017-06-17 DIAGNOSIS — E1122 Type 2 diabetes mellitus with diabetic chronic kidney disease: Secondary | ICD-10-CM

## 2017-06-17 LAB — BASIC METABOLIC PANEL
BUN/Creatinine Ratio: 19 (ref 10–24)
BUN: 26 mg/dL (ref 8–27)
CALCIUM: 9.2 mg/dL (ref 8.6–10.2)
CO2: 23 mmol/L (ref 20–29)
CREATININE: 1.36 mg/dL — AB (ref 0.76–1.27)
Chloride: 106 mmol/L (ref 96–106)
GFR calc Af Amer: 64 mL/min/{1.73_m2} (ref >59)
GFR, EST NON AFRICAN AMERICAN: 55 mL/min/{1.73_m2} — AB (ref >59)
GLUCOSE: 118 mg/dL — AB (ref 65–99)
POTASSIUM: 4.9 mmol/L (ref 3.5–5.2)
SODIUM: 146 mmol/L — AB (ref 134–144)

## 2017-06-17 MED ORDER — LISINOPRIL 2.5 MG PO TABS: 2.5000 mg | ORAL_TABLET | Freq: Every day | ORAL | 5 refills | Status: DC

## 2017-06-17 NOTE — Progress Notes (Signed)
CKD 3 noted.  Creatinine persistently elevated on recheck of BMP.  This is likely secondary to uncontrolled diabetes.  Start lisinopril 2.5 mg daily.  We will continue to monitor closely.  Plan for recheck BMP on Monday since starting ACE-I.

## 2017-06-26 ENCOUNTER — Ambulatory Visit: Payer: PRIVATE HEALTH INSURANCE | Admitting: Family Medicine

## 2017-06-30 ENCOUNTER — Telehealth: Payer: Self-pay | Admitting: Family Medicine

## 2017-06-30 NOTE — Telephone Encounter (Signed)
Patient aware.

## 2017-06-30 NOTE — Telephone Encounter (Signed)
Would recommend Claritin, Flonase and Mucinex (if needed for cough/ chest congestion).  These medications should not interfere with BP or diabetes.  If worsening, would recommend seeing UC provider.

## 2017-06-30 NOTE — Telephone Encounter (Signed)
Patient is in [place] right now.  For the last 3-4 days he has had nasal congestion, cough, ears popping, and sore throat.  He would like to know what you recommend or if you could send something in for him to the [facility].  Please advise.

## 2017-07-03 ENCOUNTER — Ambulatory Visit: Payer: PRIVATE HEALTH INSURANCE | Admitting: Family Medicine

## 2017-07-07 ENCOUNTER — Ambulatory Visit: Payer: PRIVATE HEALTH INSURANCE | Admitting: Family Medicine

## 2017-07-07 ENCOUNTER — Encounter: Payer: Self-pay | Admitting: Family Medicine

## 2017-07-07 VITALS — BP 126/85 | HR 110 | Temp 101.3°F | Ht 69.0 in | Wt 186.0 lb

## 2017-07-07 DIAGNOSIS — J0101 Acute recurrent maxillary sinusitis: Secondary | ICD-10-CM | POA: Diagnosis not present

## 2017-07-07 MED ORDER — HYDROCODONE-HOMATROPINE 5-1.5 MG/5ML PO SYRP: 5.0000 mL | ORAL_SOLUTION | Freq: Four times a day (QID) | ORAL | 0 refills | Status: DC | PRN

## 2017-07-07 NOTE — Progress Notes (Signed)
HPI: Patient is a 62yr old male who presents to the clinic today with 9 day history of fever, sore throat, worsening cough, sinus pressure with drainage and HA. Patient was on vacation with his wife and family in [place] when he began to develop a "scratchy throat," with the other symptoms coming on shortly after. He reports feeling better towards the end of the week, but says his symptoms worsened upon returning home. His cough is now productive with purulent sputum and he describes a "constant, tight HA" behind his eyes and the back of his head. He was seen 2 days ago at a [facility] clinic, where he was prescribed Amoxicillin and Prednisone but was unable to fill the prescription due to a "mix-up at the pharmacy about his birthday." He reports taking Robitussin DM, Claritin and Nyquil with no improvement. He denies, NVD, SOB and chest pain.   PMH: Diabetes Mellitus Diabetic Polyneuropathy CKD stage 3 Dyslipidemia  Social: Patient reports no sick contacts. Patient denies use of tobacco products, smoking or illicit drug use.   Medications: Atorvastatin 40mg  Gabapentin 300mg  Novolin 70/30 Lisinopril 2.5mg  tablet Metformin 500mg   Allergies: NKDA   ROS: General: (+) fever (-) weight loss, decreased appetite Skin: (-) rash, bruising Head: (+) Headache Eyes: (-) visual changes  Ears: (+) pressure in left ear (-) hearing changes, tinnitus Nose: (+) rhinorrhea, sinus pressure Throat: (+) sore throat, (-) difficulty swallowing Lungs: (+) productive cough (-) SOB Heart: (-) chest pain GI: (-) NVD MSK: (-) myalgias Neuro: (-) numbness, tingling, lightheadness, dizzyness  Vitals: T: 101.3, BP: 126/85, Pulse: 110, Wt: 186lbs  PE: General: Patient is a pleasant adult male who appears unwell, but in no acute distress with no gross abnormalities. Head: Normocephalic Eyes: Sclera clear, conjunctiva non-injected Ears: No fluid noted, cone of light present bilaterally Nose: Erythematous,  rhinorrhea Throat: Erythematous Neck: Mild cervical lymphadenopathy noted Lungs: CTA, no rales, rhonchi or crackles Heart: RRR, no murmurs, rubs or gallops GI: Bowel sounds present  Assessment: Patient presents with fever and a 9 day history of sore throat, worsening cough, sinus pressure with drainage and HA. These symptoms are consist with sinusitis.  Plan: Patient was seen Sunday night at [facility] and was prescribed a course of Amoxicillin and Prednisone. I am in agreement with this course of treatment. In addition to this, I have prescribed Hycodan to help alleviate cough. I have explained that Hycodan may cause drowsiness and that he should not operate a vehicle or heavy machinery while taking this medication. Due to symptoms and fever, I have written the patient out of work for the rest of the week. Patient has been instructed to call if he has any trouble filling prescriptions, or if symptoms do not improve or worsen.    Vita Barley, PA-S  Problem List Items Addressed This Visit    None    Visit Diagnoses    Acute recurrent maxillary sinusitis    -  Primary   Relevant Medications   HYDROcodone-homatropine (HYCODAN) 5-1.5 MG/5ML syrup      Patient was seen and examined with Vita Barley and agree with assessment and plan above.  Patient is to take the amoxicillin and prednisone and Hycodan and let us know if anything worsens or does not improve Caryl Pina, MD [facility] 07/07/2017, 2:33 PM

## 2017-07-11 DIAGNOSIS — A419 Sepsis, unspecified organism: Secondary | ICD-10-CM | POA: Insufficient documentation

## 2017-07-11 DIAGNOSIS — J9601 Acute respiratory failure with hypoxia: Secondary | ICD-10-CM | POA: Insufficient documentation

## 2017-07-11 DIAGNOSIS — J14 Pneumonia due to Hemophilus influenzae: Secondary | ICD-10-CM | POA: Insufficient documentation

## 2017-07-21 ENCOUNTER — Ambulatory Visit: Payer: PRIVATE HEALTH INSURANCE | Admitting: Family Medicine

## 2017-07-21 ENCOUNTER — Encounter: Payer: Self-pay | Admitting: Family Medicine

## 2017-07-21 VITALS — BP 116/80 | HR 100 | Temp 97.8°F | Ht 69.0 in | Wt 179.0 lb

## 2017-07-21 DIAGNOSIS — J189 Pneumonia, unspecified organism: Secondary | ICD-10-CM

## 2017-07-21 DIAGNOSIS — J181 Lobar pneumonia, unspecified organism: Secondary | ICD-10-CM

## 2017-07-21 DIAGNOSIS — N179 Acute kidney failure, unspecified: Secondary | ICD-10-CM

## 2017-07-21 DIAGNOSIS — Z09 Encounter for follow-up examination after completed treatment for conditions other than malignant neoplasm: Secondary | ICD-10-CM

## 2017-07-21 LAB — BASIC METABOLIC PANEL
BUN/Creatinine Ratio: 15 (ref 10–24)
BUN: 20 mg/dL (ref 8–27)
CALCIUM: 9 mg/dL (ref 8.6–10.2)
CO2: 22 mmol/L (ref 20–29)
CREATININE: 1.3 mg/dL — AB (ref 0.76–1.27)
Chloride: 105 mmol/L (ref 96–106)
GFR, EST AFRICAN AMERICAN: 67 mL/min/{1.73_m2} (ref >59)
GFR, EST NON AFRICAN AMERICAN: 58 mL/min/{1.73_m2} — AB (ref >59)
Glucose: 114 mg/dL — ABNORMAL HIGH (ref 65–99)
Potassium: 4.7 mmol/L (ref 3.5–5.2)
Sodium: 140 mmol/L (ref 134–144)

## 2017-07-21 LAB — CBC WITH DIFFERENTIAL/PLATELET
BASOS: 0 %
Basophils Absolute: 0 10*3/uL (ref 0.0–0.2)
EOS (ABSOLUTE): 0.2 10*3/uL (ref 0.0–0.4)
EOS: 2 %
HEMATOCRIT: 35 % — AB (ref 37.5–51.0)
HEMOGLOBIN: 12.2 g/dL — AB (ref 13.0–17.7)
IMMATURE GRANULOCYTES: 0 %
Immature Grans (Abs): 0 10*3/uL (ref 0.0–0.1)
Lymphocytes Absolute: 1.2 10*3/uL (ref 0.7–3.1)
Lymphs: 18 %
MCH: 28.5 pg (ref 26.6–33.0)
MCHC: 34.9 g/dL (ref 31.5–35.7)
MCV: 82 fL (ref 79–97)
MONOCYTES: 4 %
MONOS ABS: 0.3 10*3/uL (ref 0.1–0.9)
Neutrophils Absolute: 5 10*3/uL (ref 1.4–7.0)
Neutrophils: 76 %
Platelets: 321 10*3/uL (ref 150–450)
RBC: 4.28 x10E6/uL (ref 4.14–5.80)
RDW: 13.2 % (ref 12.3–15.4)
WBC: 6.7 10*3/uL (ref 3.4–10.8)

## 2017-07-21 NOTE — Progress Notes (Signed)
Subjective: CC: ED follow up PCP: Janora Norlander, DO Anthony French is a 62 y.o. male presenting to clinic today for:  1. PNA Patient was admitted to the hospital on 07/11/2017 for right lower lobe pneumonia that was found to be a result of Haemophilus influenza infection.  He was desaturating and required oxygen during initial presentation.  He was initially treated with Rocephin and azithromycin and then transitioned over to Ceftin, which she was discharged with for an additional 7 days.  He also was noted to have an acute kidney injury which improved with fluids.  On 07/19/2017, he was seen in the emergency department for tachycardia and weakness.  He was noted to have a low blood pressure and elevated heart rate.  He was given IV fluids and symptoms resolved.  He follows up today for recheck.  He notes that he has been doing fairly well since evaluation on Sunday.  He states that he does continue to have some dyspnea on exertion but it is greatly improved from initial presentation.  He has been hydrating without difficulty.  Denies any heart palpitations, dizziness.  No fevers.  He has about 3 pills of the Ceftin left.  Denies any hemoptysis.   ROS: Per HPI  Allergies  Allergen Reactions  . Other Hives    Pt states that he is allergic to an ABT but he can not remember what it is   Past Medical History:  Diagnosis Date  . Diabetes ([place])   . Diabetic neuropathy ([place])     Current Outpatient Medications:  .  ACCU-CHEK GUIDE test strip, TEST TWICE DAILY OR AS DIRECTED, Disp: 100 each, Rfl: 1 .  atorvastatin (LIPITOR) 40 MG tablet, Take 1 tablet (40 mg total) by mouth daily., Disp: 30 tablet, Rfl: 5 .  blood glucose meter kit and supplies, Dispense based on patient and insurance preference. Use up to four times daily as directed. (FOR ICD-10 E10.9, E11.9). Check blood sugar twice a day as directed., Disp: 1 each, Rfl: 0 .  gabapentin (NEURONTIN) 300 MG capsule, Take 2 mg by mouth 3  (three) times daily. , Disp: , Rfl:  .  HYDROcodone-homatropine (HYCODAN) 5-1.5 MG/5ML syrup, Take 5 mLs by mouth every 6 (six) hours as needed for cough., Disp: 120 mL, Rfl: 0 .  insulin NPH-regular Human (NOVOLIN 70/30) (70-30) 100 UNIT/ML injection, INJECT 22 UNITS UNDER THE SKIN WITH BREAKFAST AND 24 UNITS WITH SUPPER, Disp: 10 mL, Rfl: 3 .  lisinopril (ZESTRIL) 2.5 MG tablet, Take 1 tablet (2.5 mg total) by mouth daily., Disp: 30 tablet, Rfl: 5 .  metFORMIN (GLUCOPHAGE) 500 MG tablet, Take 500 mg by mouth 2 (two) times daily. , Disp: , Rfl:  Social History   Socioeconomic History  . Marital status: Single    Spouse name: Not on file  . Number of children: Not on file  . Years of education: Not on file  . Highest education level: Not on file  Occupational History  . Not on file  Social Needs  . Financial resource strain: Not on file  . Food insecurity:    Worry: Not on file    Inability: Not on file  . Transportation needs:    Medical: Not on file    Non-medical: Not on file  Tobacco Use  . Smoking status: Never Smoker  . Smokeless tobacco: Never Used  Substance and Sexual Activity  . Alcohol use: Not on file  . Drug use: Not on file  . Sexual  activity: Not on file  Lifestyle  . Physical activity:    Days per week: Not on file    Minutes per session: Not on file  . Stress: Not on file  Relationships  . Social connections:    Talks on phone: Not on file    Gets together: Not on file    Attends religious service: Not on file    Active member of club or organization: Not on file    Attends meetings of clubs or organizations: Not on file    Relationship status: Not on file  . Intimate partner violence:    Fear of current or ex partner: Not on file    Emotionally abused: Not on file    Physically abused: Not on file    Forced sexual activity: Not on file  Other Topics Concern  . Not on file  Social History Narrative  . Not on file   Family History  Problem  Relation Age of Onset  . Stroke Mother   . COPD Father   . Diabetes Sister   . Stroke Brother   . Diabetes Brother     Objective: Office vital signs reviewed. BP 116/80   Pulse 100   Temp 97.8 F (36.6 C) (Oral)   Ht _0  (1.753 m)   Wt 179 lb (81.2 kg)   SpO2 98%   BMI 26.43 kg/m   Physical Examination:  General: Awake, alert, well nourished, nontoxic, No acute distress Cardio: regular rate and rhythm, S1S2 heard, no murmurs appreciated Pulm: clear to auscultation bilaterally, no wheezes, rhonchi or rales; normal work of breathing on room air Extremities: warm, well perfused, No edema, cyanosis or clubbing; +2 pulses bilaterally MSK: normal gait and normal station  Assessment/ Plan: 62 y.o. male   1. Hospital discharge follow-up I reviewed his hospital course, xray reports and labs from 6/1-6/9.  He had an AKI with creatinine of 2.4 on 6/9.  Creatinine at discharge from hospital was 1.17. We will plan to recheck BMP as below.  He continues to have some dyspnea with exertion but overall is doing well since discharge.  He has a few days left of the Ceftin.  I encouraged him to continue this.  I did have him ambulate and we monitored his vital signs with ambulation.  There were no desaturations.  We will plan to repeat his chest x-ray in about 4 weeks to ensure resolution of right lower lobe opacity.  Home care instructions were reviewed and reasons for return/emergent evaluation discussed.  Patient was good understanding will follow-up as needed.  2. Community acquired pneumonia of right lower lobe of lung ([place]) Noted to have elevated white blood cell count, on 07/19/17 was 11.2.  This was up from discharge white blood cell count of 9.1.  We will plan to recheck CBC and BMP. - CBC with Differential  3. AKI (acute kidney injury) ([place]) - Basic Metabolic Panel   Orders Placed This Encounter  Procedures  . CBC with Differential  . Basic Metabolic Panel    Janora Norlander,  DO [place] [phone]

## 2017-07-21 NOTE — Patient Instructions (Signed)
You had labs performed today.  You will be contacted with the results of the labs once they are available, usually in the next 3 business days for routine lab work.  If you had a pap smear or biopsy performed, expect to be contacted in about 7-10 days.  Plan to see me again in July.  We will repeat your chest xray at that time.   Community-Acquired Pneumonia, Adult Pneumonia is an infection of the lungs. One type of pneumonia can happen while a person is in a hospital. A different type can happen when a person is not in a hospital (community-acquired pneumonia). It is easy for this kind to spread from person to person. It can spread to you if you breathe near an infected person who coughs or sneezes. Some symptoms include:  A dry cough.  A wet (productive) cough.  Fever.  Sweating.  Chest pain.  Follow these instructions at home:  Take over-the-counter and prescription medicines only as told by your doctor. ? Only take cough medicine if you are losing sleep. ? If you were prescribed an antibiotic medicine, take it as told by your doctor. Do not stop taking the antibiotic even if you start to feel better.  Sleep with your head and neck raised (elevated). You can do this by putting a few pillows under your head, or you can sleep in a recliner.  Do not use tobacco products. These include cigarettes, chewing tobacco, and e-cigarettes. If you need help quitting, ask your doctor.  Drink enough water to keep your pee (urine) clear or pale yellow. A shot (vaccine) can help prevent pneumonia. Shots are often suggested for:  People older than 64 years of age.  People older than 64 years of age: ? Who are having cancer treatment. ? Who have long-term (chronic) lung disease. ? Who have problems with their body's defense system (immune system).  You may also prevent pneumonia if you take these actions:  Get the flu (influenza) shot every year.  Go to the dentist as often as  told.  Wash your hands often. If soap and water are not available, use hand sanitizer.  Contact a doctor if:  You have a fever.  You lose sleep because your cough medicine does not help. Get help right away if:  You are short of breath and it gets worse.  You have more chest pain.  Your sickness gets worse. This is very serious if: ? You are an older adult. ? Your body's defense system is weak.  You cough up blood. This information is not intended to replace advice given to you by your health care provider. Make sure you discuss any questions you have with your health care provider. Document Released: 07/16/2007 Document Revised: 07/05/2015 Document Reviewed: 05/24/2014 Elsevier Interactive Patient Education  [employer].

## 2017-08-06 ENCOUNTER — Encounter: Payer: Self-pay | Admitting: *Deleted

## 2017-08-18 ENCOUNTER — Ambulatory Visit: Payer: PRIVATE HEALTH INSURANCE | Admitting: Family Medicine

## 2017-08-18 NOTE — Progress Notes (Deleted)
Subjective: CC: f/u PNA PCP: Anthony Norlander, DO IRW:Anthony French is a 62 y.o. male presenting to clinic today for:  1. Follow up pneumonia ***   ROS: Per HPI  Allergies  Allergen Reactions  . Other Hives    Pt states that he is allergic to an ABT but he can not remember what it is   Past Medical History:  Diagnosis Date  . Diabetes ([place])   . Diabetic neuropathy ([place])     Current Outpatient Medications:  .  ACCU-CHEK GUIDE test strip, TEST TWICE DAILY OR AS DIRECTED, Disp: 100 each, Rfl: 1 .  atorvastatin (LIPITOR) 40 MG tablet, Take 1 tablet (40 mg total) by mouth daily., Disp: 30 tablet, Rfl: 5 .  blood glucose meter kit and supplies, Dispense based on patient and insurance preference. Use up to four times daily as directed. (FOR ICD-10 E10.9, E11.9). Check blood sugar twice a day as directed., Disp: 1 each, Rfl: 0 .  cefUROXime (CEFTIN) 500 MG tablet, TAKE ONE TABLET (500 MG DOSE) BY MOUTH 2 (TWO) TIMES DAILY FOR 7 DAYS., Disp: , Rfl: 0 .  gabapentin (NEURONTIN) 300 MG capsule, Take 2 mg by mouth 3 (three) times daily. , Disp: , Rfl:  .  HYDROcodone-homatropine (HYCODAN) 5-1.5 MG/5ML syrup, Take 5 mLs by mouth every 6 (six) hours as needed for cough., Disp: 120 mL, Rfl: 0 .  insulin NPH-regular Human (NOVOLIN 70/30) (70-30) 100 UNIT/ML injection, INJECT 22 UNITS UNDER THE SKIN WITH BREAKFAST AND 24 UNITS WITH SUPPER, Disp: 10 mL, Rfl: 3 .  lisinopril (ZESTRIL) 2.5 MG tablet, Take 1 tablet (2.5 mg total) by mouth daily., Disp: 30 tablet, Rfl: 5 .  metFORMIN (GLUCOPHAGE) 500 MG tablet, Take 500 mg by mouth 2 (two) times daily. , Disp: , Rfl:  Social History   Socioeconomic History  . Marital status: Single    Spouse name: Not on file  . Number of children: Not on file  . Years of education: Not on file  . Highest education level: Not on file  Occupational History  . Not on file  Social Needs  . Financial resource strain: Not on file  . Food insecurity:    Worry:  Not on file    Inability: Not on file  . Transportation needs:    Medical: Not on file    Non-medical: Not on file  Tobacco Use  . Smoking status: Never Smoker  . Smokeless tobacco: Never Used  Substance and Sexual Activity  . Alcohol use: Not on file  . Drug use: Not on file  . Sexual activity: Not on file  Lifestyle  . Physical activity:    Days per week: Not on file    Minutes per session: Not on file  . Stress: Not on file  Relationships  . Social connections:    Talks on phone: Not on file    Gets together: Not on file    Attends religious service: Not on file    Active member of club or organization: Not on file    Attends meetings of clubs or organizations: Not on file    Relationship status: Not on file  . Intimate partner violence:    Fear of current or ex partner: Not on file    Emotionally abused: Not on file    Physically abused: Not on file    Forced sexual activity: Not on file  Other Topics Concern  . Not on file  Social History Narrative  .  Not on file   Family History  Problem Relation Age of Onset  . Stroke Mother   . COPD Father   . Diabetes Sister   . Stroke Brother   . Diabetes Brother     Objective: Office vital signs reviewed. There were no vitals taken for this visit.  Physical Examination:  General: Awake, alert, *** nourished, No acute distress HEENT: Normal    Neck: No masses palpated. No lymphadenopathy    Ears: Tympanic membranes intact, normal light reflex, no erythema, no bulging    Eyes: PERRLA, extraocular membranes intact, sclera ***    Nose: nasal turbinates moist, *** nasal discharge    Throat: moist mucus membranes, no erythema, *** tonsillar exudate.  Airway is patent Cardio: regular rate and rhythm, S1S2 heard, no murmurs appreciated Pulm: clear to auscultation bilaterally, no wheezes, rhonchi or rales; normal work of breathing on room air GI: soft, non-tender, non-distended, bowel sounds present x4, no hepatomegaly, no  splenomegaly, no masses GU: external vaginal tissue ***, cervix ***, *** punctate lesions on cervix appreciated, *** discharge from cervical os, *** bleeding, *** cervical motion tenderness, *** abdominal/ adnexal masses Extremities: warm, well perfused, No edema, cyanosis or clubbing; +*** pulses bilaterally MSK: *** gait and *** station Skin: dry; intact; no rashes or lesions Neuro: *** Strength and light touch sensation grossly intact, *** DTRs ***/4  Assessment/ Plan: 62 y.o. male   ***  No orders of the defined types were placed in this encounter.  No orders of the defined types were placed in this encounter.    Anthony Norlander, DO [place] [phone]

## 2017-08-19 ENCOUNTER — Ambulatory Visit (INDEPENDENT_AMBULATORY_CARE_PROVIDER_SITE_OTHER): Payer: PRIVATE HEALTH INSURANCE

## 2017-08-19 ENCOUNTER — Encounter: Payer: Self-pay | Admitting: Family Medicine

## 2017-08-19 ENCOUNTER — Ambulatory Visit: Payer: PRIVATE HEALTH INSURANCE | Admitting: Family Medicine

## 2017-08-19 VITALS — BP 125/83 | HR 63 | Temp 97.4°F | Ht 69.0 in | Wt 185.4 lb

## 2017-08-19 DIAGNOSIS — J189 Pneumonia, unspecified organism: Secondary | ICD-10-CM

## 2017-08-19 DIAGNOSIS — W57XXXA Bitten or stung by nonvenomous insect and other nonvenomous arthropods, initial encounter: Secondary | ICD-10-CM

## 2017-08-19 DIAGNOSIS — S80811A Abrasion, right lower leg, initial encounter: Secondary | ICD-10-CM

## 2017-08-19 DIAGNOSIS — J181 Lobar pneumonia, unspecified organism: Secondary | ICD-10-CM | POA: Diagnosis not present

## 2017-08-19 DIAGNOSIS — Z23 Encounter for immunization: Secondary | ICD-10-CM | POA: Diagnosis not present

## 2017-08-19 MED ORDER — DOXYCYCLINE HYCLATE 100 MG PO TABS: 200.0000 mg | ORAL_TABLET | Freq: Once | ORAL | 0 refills | Status: AC

## 2017-08-19 NOTE — Addendum Note (Signed)
Addended byFaylene Million C on: 08/19/2017 11:07 AM   Modules accepted: Orders

## 2017-08-19 NOTE — Progress Notes (Signed)
Subjective: CC: f/u PNA PCP: Janora Norlander, DO ONG:EXBMW Anthony French is a 62 y.o. male presenting to clinic today for:  1. Follow up pneumonia Patient was admitted to the hospital on 07/11/2017 for right lower lobe pneumonia secondary to H influenza infection.  He was treated with antibiotics and recommended to have a repeat chest x-ray in about 4 to 6 weeks.  He follows up today for the chest x-ray. He reports that he has been doing well since last visit.  No SOB, CP, cough, DOE, fevers.  2. Tick bite Patient reports that he had a tick bite on the left anterior shin Sunday.  He thinks it been there for at least 24 hours.  He notes it "took a chunk of his skin off when he removed it ".  He has not had any fevers, myalgias, nausea, vomiting, dizziness or rash.  No flulike symptoms.  3.  Abrasion Patient reports an abrasion from an unknown source on the right lower extremity, which his wife noticed last week.  He has some surrounding erythema but denies any exquisite tenderness, no exudate, no purulence, no bleeding.  ROS: Per HPI  Allergies  Allergen Reactions  . Other Hives    Pt states that he is allergic to an ABT but he can not remember what it is   Past Medical History:  Diagnosis Date  . Diabetes ([place])   . Diabetic neuropathy ([place])     Current Outpatient Medications:  .  ACCU-CHEK GUIDE test strip, TEST TWICE DAILY OR AS DIRECTED, Disp: 100 each, Rfl: 1 .  atorvastatin (LIPITOR) 40 MG tablet, Take 1 tablet (40 mg total) by mouth daily., Disp: 30 tablet, Rfl: 5 .  blood glucose meter kit and supplies, Dispense based on patient and insurance preference. Use up to four times daily as directed. (FOR ICD-10 E10.9, E11.9). Check blood sugar twice a day as directed., Disp: 1 each, Rfl: 0 .  gabapentin (NEURONTIN) 300 MG capsule, Take 2 mg by mouth 3 (three) times daily. , Disp: , Rfl:  .  insulin NPH-regular Human (NOVOLIN 70/30) (70-30) 100 UNIT/ML injection, INJECT 22 UNITS UNDER  THE SKIN WITH BREAKFAST AND 24 UNITS WITH SUPPER, Disp: 10 mL, Rfl: 3 .  lisinopril (ZESTRIL) 2.5 MG tablet, Take 1 tablet (2.5 mg total) by mouth daily., Disp: 30 tablet, Rfl: 5 .  metFORMIN (GLUCOPHAGE) 500 MG tablet, Take 500 mg by mouth 2 (two) times daily. , Disp: , Rfl:  Social History   Socioeconomic History  . Marital status: Single    Spouse name: Not on file  . Number of children: Not on file  . Years of education: Not on file  . Highest education level: Not on file  Occupational History  . Not on file  Social Needs  . Financial resource strain: Not on file  . Food insecurity:    Worry: Not on file    Inability: Not on file  . Transportation needs:    Medical: Not on file    Non-medical: Not on file  Tobacco Use  . Smoking status: Never Smoker  . Smokeless tobacco: Never Used  Substance and Sexual Activity  . Alcohol use: Not on file  . Drug use: Not on file  . Sexual activity: Not on file  Lifestyle  . Physical activity:    Days per week: Not on file    Minutes per session: Not on file  . Stress: Not on file  Relationships  . Social connections:  Talks on phone: Not on file    Gets together: Not on file    Attends religious service: Not on file    Active member of club or organization: Not on file    Attends meetings of clubs or organizations: Not on file    Relationship status: Not on file  . Intimate partner violence:    Fear of current or ex partner: Not on file    Emotionally abused: Not on file    Physically abused: Not on file    Forced sexual activity: Not on file  Other Topics Concern  . Not on file  Social History Narrative  . Not on file   Family History  Problem Relation Age of Onset  . Stroke Mother   . COPD Father   . Diabetes Sister   . Stroke Brother   . Diabetes Brother     Objective: Office vital signs reviewed. BP 125/83   Pulse 63   Temp (!) 97.4 F (36.3 C) (Oral)   Ht 5' 9" (1.753 m)   Wt 185 lb 6.4 oz (84.1 kg)    SpO2 100%   BMI 27.38 kg/m   Physical Examination:  General: Awake, alert, well nourished, well appearing, No acute distress Cardio: regular rate and rhythm, S1S2 heard, no murmurs appreciated Pulm: clear to auscultation bilaterally, no wheezes, rhonchi or rales; normal work of breathing on room air Extremities: warm, well perfused, No edema, cyanosis or clubbing; +2 pulses bilaterally Skin: dry; right lower extremity with a large abrasion along the anterior medial shin.  There is mild erythema at the borders but no gross evidence of infection.  No increased warmth.  No induration.  No exudate.  No fluctuance.  Lesion seems to be healing.  Left lower extremity with a large abrasion where he notes the tick was attached.  This is hemostatic.  No appreciable rash, erythema, induration or exudate.  Dg Chest 2 View  Result Date: 08/19/2017 CLINICAL DATA:  Follow-up of right lower lobe pneumonia in May 2019 EXAM: CHEST - 2 VIEW COMPARISON:  None in PACs FINDINGS: The lungs are adequately inflated. The interstitial markings are mildly prominent. There is no alveolar infiltrate or pleural effusion. The heart and pulmonary vascularity are normal. The mediastinum is normal in width. The bony thorax exhibits no acute abnormality. IMPRESSION: There is no pneumonia. Mild interstitial prominence may be normal for the patient or may reflect chronic bronchitic changes. Electronically Signed   By: David  Jordan M.D.   On: 08/19/2017 09:16   Assessment/ Plan: 62 y.o. male   1. Community acquired pneumonia of right lower lobe of lung ([facility]) Personal review of chest x-ray demonstrates no persistent right lower lobe infiltrate.  Radiologist reinforces this.  Patient is afebrile and well-appearing.  It appears that symptoms have totally resolved. - DG Chest 2 View; Future  2. Tick bite, initial encounter Will empirically treat with doxycycline 200 mg p.o. x1  3. Abrasion of right lower extremity, initial  encounter Recommended continued wound care.  No evidence of secondary infection on today's exam.   Orders Placed This Encounter  Procedures  . DG Chest 2 View    Standing Status:   Future    Number of Occurrences:   1    Standing Expiration Date:   10/21/2018    Order Specific Question:   Reason for Exam (SYMPTOM  OR DIAGNOSIS REQUIRED)    Answer:   pneumonia 4.5 weeks ago    Order Specific Question:     Preferred imaging location?    Answer:   Internal    Order Specific Question:   Radiology Contrast Protocol - do NOT remove file path    Answer:   _[email or web address]   Meds ordered this encounter  Medications  . doxycycline (VIBRA-TABS) 100 MG tablet    Sig: Take 2 tablets (200 mg total) by mouth once for 1 dose.    Dispense:  2 tablet    Refill:  [place], DO [place] [phone]

## 2017-08-19 NOTE — Patient Instructions (Addendum)
Your chest xray shows resolution of the pneumonia in the right lobe.  I have sent in Doxycycline for you to take 2 tablets as a single dose to prevent Lyme disease.  Keep triple antibiotic cream on the scratch on the right leg.   Tick Bite Information, Adult Ticks are insects that can bite. Most ticks live in shrubs and grassy areas. They climb onto people and animals that go by. Then they bite. Some ticks carry germs that can make you sick. How can I prevent tick bites?  Use an insect repellent that has 20% or higher of the ingredients DEET, picaridin, or IR3535. Put this insect repellent on: ? Bare skin. ? The tops of your boots. ? Your pant legs. ? The ends of your sleeves.  If you use an insect repellent that has the ingredient permethrin, make sure to follow the instructions on the bottle. Treat the following: ? Clothing. ? Supplies. ? Boots. ? Tents.  Wear long sleeves, long pants, and light colors.  Tuck your pant legs into your socks.  Stay in the middle of the trail.  Try not to walk through long grass.  Before going inside your house, check your clothes, hair, and skin for ticks. Make sure to check your head, neck, armpits, waist, groin, and joint areas.  Check for ticks every day.  When you come indoors: ? Wash your clothes right away. ? Shower right away. ? Dry your clothes in a dryer on high heat for 60 minutes or more. What is the right way to remove a tick? Remove a tick from your skin as soon as possible.  To remove a tick that is crawling on your skin: ? Go outdoors and brush the tick off. ? Use tape or a lint roller.  To remove a tick that is biting: ? Wash your hands. ? If you have latex gloves, put them on. ? Use tweezers, curved forceps, or a tick-removal tool to grasp the tick. Grasp the tick as close to your skin and as close to the tick's head as possible. ? Gently pull up until the tick lets go.  Try to keep the tick's head attached to its  body.  Do not twist or jerk the tick.  Do not squeeze or crush the tick.  Do not try to remove a tick with heat, alcohol, petroleum jelly, or fingernail polish. How should I get rid of a tick? Here are some ways to get rid of a tick that is alive:  Place the tick in rubbing alcohol.  Place the tick in a bag or container you can close tightly.  Wrap the tick tightly in tape.  Flush the tick down the toilet.  Contact a doctor if:  You have symptoms of a disease, such as: ? Pain in a muscle, joint, or bone. ? Trouble walking or moving your legs. ? Numbness in your legs. ? Inability to move (paralysis). ? A red rash that makes a circle (bull's-eye rash). ? Redness and swelling where the tick bit you. ? A fever. ? Throwing up (vomiting) over and over. ? Diarrhea. ? Weight loss. ? Tender and swollen lymph glands. ? Shortness of breath. ? Cough. ? Belly pain (abdominal pain). ? Headache. ? Being more tired than normal. ? A change in how alert (conscious) you are. ? Confusion. Get help right away if:  You cannot remove a tick.  A part of a tick breaks off and gets stuck in your skin.  You  are feeling worse. Summary  Ticks may carry germs that can make you sick.  To prevent tick bites, wear long sleeves, long pants, and light colors. Use insect repellent. Follow the instructions on the bottle.  If the tick is biting, do not try to remove it with heat, alcohol, petroleum jelly, or fingernail polish.  Use tweezers, curved forceps, or a tick-removal tool to grasp the tick. Gently pull up until the tick lets go. Do not twist or jerk the tick. Do not squeeze or crush the tick.  If you have symptoms, contact a doctor. This information is not intended to replace advice given to you by your health care provider. Make sure you discuss any questions you have with your health care provider. Document Released: 04/23/2009 Document Revised: 05/09/2016 Document Reviewed:  05/09/2016 Elsevier Interactive Patient Education  2018 [place] An abrasion is a cut or scrape on the surface of your skin. An abrasion does not go through all of the layers of your skin. It is important to take good care of your abrasion to prevent infection. Follow these instructions at home: Medicines  Take or apply medicines only as told by your doctor.  If you were prescribed an antibiotic ointment, finish all of it even if you start to feel better. Wound care  Clean the wound with mild soap and water 2-3 times per day or as told by your doctor. Pat your wound dry with a clean towel. Do not rub it.  There are many ways to close and cover a wound. Follow instructions from your doctor about: ? How to take care of your wound. ? When and how you should change your bandage (dressing). ? When and how you should take off your dressing.  Check your wound every day for signs of infection. Watch for: ? Redness, swelling, or pain. ? Fluid, blood, or pus. General instructions  Keep the dressing dry as told by your doctor. Do not take baths, swim, use a hot tub, or do anything that would put your wound underwater until your doctor says it is okay.  If there is swelling, raise (elevate) the injured area above the level of your heart while you are sitting or lying down.  Keep all follow-up visits as told by your doctor. This is important. Contact a doctor if:  You were given a tetanus shot and you have any of these where the needle went in: ? Swelling. ? Very bad pain. ? Redness. ? Bleeding.  Medicine does not help your pain.  You have any of these at the site of the wound: ? More redness. ? More swelling. ? More pain. Get help right away if:  You have a red streak going away from your wound.  You have a fever.  You have fluid, blood, or pus coming from your wound.  There is a bad smell coming from your wound. This information is not intended to replace  advice given to you by your health care provider. Make sure you discuss any questions you have with your health care provider. Document Released: 07/16/2007 Document Revised: 07/05/2015 Document Reviewed: 01/25/2014 Elsevier Interactive Patient Education  [employer].

## 2017-08-28 ENCOUNTER — Other Ambulatory Visit: Payer: Self-pay | Admitting: Family Medicine

## 2017-09-01 ENCOUNTER — Encounter: Payer: Self-pay | Admitting: Family Medicine

## 2017-09-12 ENCOUNTER — Other Ambulatory Visit: Payer: Self-pay | Admitting: Family Medicine

## 2017-09-24 LAB — HM DIABETES EYE EXAM

## 2017-10-16 ENCOUNTER — Other Ambulatory Visit: Payer: Self-pay | Admitting: Family Medicine

## 2017-10-29 ENCOUNTER — Ambulatory Visit: Payer: PRIVATE HEALTH INSURANCE | Admitting: Family Medicine

## 2017-10-29 ENCOUNTER — Encounter: Payer: Self-pay | Admitting: Family Medicine

## 2017-10-29 VITALS — BP 122/80 | HR 60 | Temp 97.9°F | Ht 69.0 in | Wt 187.0 lb

## 2017-10-29 DIAGNOSIS — N183 Chronic kidney disease, stage 3 unspecified: Secondary | ICD-10-CM

## 2017-10-29 DIAGNOSIS — Z794 Long term (current) use of insulin: Secondary | ICD-10-CM

## 2017-10-29 DIAGNOSIS — E1122 Type 2 diabetes mellitus with diabetic chronic kidney disease: Secondary | ICD-10-CM | POA: Diagnosis not present

## 2017-10-29 LAB — BAYER DCA HB A1C WAIVED: HB A1C: 6.4 % (ref <7.0)

## 2017-10-29 MED ORDER — LISINOPRIL 2.5 MG PO TABS: 2.5000 mg | ORAL_TABLET | Freq: Every day | ORAL | 3 refills | Status: DC

## 2017-10-29 MED ORDER — ATORVASTATIN CALCIUM 40 MG PO TABS: 40.0000 mg | ORAL_TABLET | Freq: Every day | ORAL | 3 refills | Status: DC

## 2017-10-29 MED ORDER — GABAPENTIN 300 MG PO CAPS: ORAL_CAPSULE | ORAL | 1 refills | Status: DC

## 2017-10-29 MED ORDER — METFORMIN HCL 500 MG PO TABS: 500.0000 mg | ORAL_TABLET | Freq: Two times a day (BID) | ORAL | 3 refills | Status: DC

## 2017-10-29 MED ORDER — INSULIN NPH ISOPHANE & REGULAR (70-30) 100 UNIT/ML ~~LOC~~ SUSP: 22.0000 [IU] | Freq: Every day | SUBCUTANEOUS | 1 refills | Status: DC

## 2017-10-29 NOTE — Progress Notes (Signed)
Subjective: CC: DM2 w/ HLD and CKD3 HPI: Anthony French is a 62 y.o. male presenting to clinic today for:  Type 2 Diabetes w/ HLD and CKD3:  Patient reports: Glucometer: Accucheck, he admits that he has not been checking his blood sugars regularly because his battery and his glucometer ran out.  Taking medication(s): Metformin 500 mg p.o. twice daily and Novolin 70/30 22 units every morning.he has not been using a nighttime insulin because his blood sugars have been running in the 100s.  Using Lipitor qhs, Lisinopril 2.86m.  Side effects: none.  No hypoglycemic episodes.  Last eye exam: Had done recently.  He was told that there may be glaucoma.  He is going to have his eyes rechecked in a few months. Last foot exam: 03/2017 Last A1c: 9.6 on 03/2017 Nephropathy screen indicated?: negative urine microalbumin in 04/2017. On ACE-I for CKD3. Last flu, zoster and/or pneumovax: Needs PNA  Denies fever, chills, dizziness, LOC, polyuria, polydipsia, unintended weight loss/gain, foot ulcerations, or chest pain. He reports numbness or tingling in extremities. He notes that he has not yet pick up his diabetic shoes.  He was told by the facility that his shoes were not covered.  He has been using a brand-new pair of new balance and notes the callus previously noted on his diabetic foot exam has improved.  ROS: Per HPI  Past Medical History:  Diagnosis Date  . [place] ([place]   . Diabetic neuropathy (HCC)    Allergies  Allergen Reactions  . Other Hives    Pt states that he is allergic to an ABT but he can not remember what it is    Current Outpatient Medications:  .  ACCU-CHEK GUIDE test strip, TEST TWICE DAILY OR AS DIRECTED, Disp: 100 each, Rfl: 1 .  atorvastatin (LIPITOR) 40 MG tablet, Take 1 tablet (40 mg total) by mouth daily., Disp: 30 tablet, Rfl: 5 .  blood glucose meter kit and supplies, Dispense based on patient and insurance preference. Use up to four times daily as directed. (FOR ICD-10  E10.9, E11.9). Check blood sugar twice a day as directed., Disp: 1 each, Rfl: 0 .  gabapentin (NEURONTIN) 300 MG capsule, Take 2 mg by mouth 3 (three) times daily. , Disp: , Rfl:  .  insulin NPH-regular Human (NOVOLIN 70/30) (70-30) 100 UNIT/ML injection, INJECT 22 UNITS UNDER THE SKIN WITH BREAKFAST AND 24 UNITS WITH SUPPER, Disp: 10 mL, Rfl: 1 .  lisinopril (ZESTRIL) 2.5 MG tablet, Take 1 tablet (2.5 mg total) by mouth daily., Disp: 30 tablet, Rfl: 5 .  metFORMIN (GLUCOPHAGE) 500 MG tablet, Take 500 mg by mouth 2 (two) times daily. , Disp: , Rfl:  Social History   Socioeconomic History  . Marital status: Single    Spouse name: Not on file  . Number of children: Not on file  . Years of education: Not on file  . Highest education level: Not on file  Occupational History  . Not on file  Social Needs  . Financial resource strain: Not on file  . Food insecurity:    Worry: Not on file    Inability: Not on file  . Transportation needs:    Medical: Not on file    Non-medical: Not on file  Tobacco Use  . Smoking status: Never Smoker  . Smokeless tobacco: Never Used  Substance and Sexual Activity  . Alcohol use: Not on file  . Drug use: Not on file  . Sexual activity: Not on file  Lifestyle  . Physical activity:    Days per week: Not on file    Minutes per session: Not on file  . Stress: Not on file  Relationships  . Social connections:    Talks on phone: Not on file    Gets together: Not on file    Attends religious service: Not on file    Active member of club or organization: Not on file    Attends meetings of clubs or organizations: Not on file    Relationship status: Not on file  . Intimate partner violence:    Fear of current or ex partner: Not on file    Emotionally abused: Not on file    Physically abused: Not on file    Forced sexual activity: Not on file  Other Topics Concern  . Not on file  Social History Narrative  . Not on file   Family History  Problem  Relation Age of Onset  . Stroke Mother   . COPD Father   . Diabetes Sister   . Stroke Brother   . Diabetes Brother     Health Maintenance: colon cancer screening.  Objective: Office vital signs reviewed. BP 122/80   Pulse 60   Temp 97.9 F (36.6 C) (Oral)   Ht 5' 9"  (1.753 m)   Wt 187 lb (84.8 kg)   BMI 27.62 kg/m   Physical Examination:  General: Awake, alert, well appearing male. No acute distress HEENT: sclera white, moist mucous membranes Cardio: regular rate and rhythm, S1S2 heard, no murmurs appreciated Pulm: clear to auscultation bilaterally, no wheezes, rhonchi or rales; normal work of breathing on room air  Assessment/ Plan: 62 y.o. male   1. Type 2 diabetes mellitus with stage 3 chronic kidney disease, with long-term current use of insulin (HCC) Blood sugar under excellent control today.  His A1c was 6.4.  Continue current regimen with mixed Novolin 22 units every morning.  Continue metformin p.o. twice daily.  We will plan to recheck in 6 months or sooner if needed.  He will have his diabetic eye exam report sent to our office. - Bayer DCA Hb A1c Waived  2. CKD stage 3 due to type 2 diabetes mellitus ([place]) Check BMP to evaluate for stability of renal function.  Continue lisinopril at low dose for renal protection. - Basic Metabolic Panel   Janora Norlander, DO [place] [phone]

## 2017-10-29 NOTE — Patient Instructions (Signed)
You had labs performed today.  You will be contacted with the results of the labs once they are available, usually in the next 3 business days for routine lab work.    Your diabetes is under excellent control today.  Keep up the good work.  Continue using metformin twice a day as directed.  Continue the insulin 22 units subcutaneously each day.  Keep an eye on the blood sugars and bring your log into our next appointment.  We can see each other back again in 6 months.

## 2017-10-30 ENCOUNTER — Other Ambulatory Visit: Payer: Self-pay | Admitting: Family Medicine

## 2017-10-30 DIAGNOSIS — N183 Chronic kidney disease, stage 3 (moderate): Principal | ICD-10-CM

## 2017-10-30 DIAGNOSIS — E1122 Type 2 diabetes mellitus with diabetic chronic kidney disease: Secondary | ICD-10-CM

## 2017-10-30 LAB — BASIC METABOLIC PANEL
BUN/Creatinine Ratio: 22 (ref 10–24)
BUN: 36 mg/dL — ABNORMAL HIGH (ref 8–27)
CALCIUM: 8.8 mg/dL (ref 8.6–10.2)
CHLORIDE: 107 mmol/L — AB (ref 96–106)
CO2: 20 mmol/L (ref 20–29)
Creatinine, Ser: 1.63 mg/dL — ABNORMAL HIGH (ref 0.76–1.27)
GFR calc Af Amer: 51 mL/min/{1.73_m2} — ABNORMAL LOW (ref >59)
GFR calc non Af Amer: 44 mL/min/{1.73_m2} — ABNORMAL LOW (ref >59)
GLUCOSE: 366 mg/dL — AB (ref 65–99)
POTASSIUM: 5.1 mmol/L (ref 3.5–5.2)
Sodium: 140 mmol/L (ref 134–144)

## 2017-11-06 ENCOUNTER — Encounter: Payer: Self-pay | Admitting: Family Medicine

## 2017-11-06 DIAGNOSIS — E11319 Type 2 diabetes mellitus with unspecified diabetic retinopathy without macular edema: Secondary | ICD-10-CM | POA: Insufficient documentation

## 2017-11-14 ENCOUNTER — Other Ambulatory Visit: Payer: Self-pay | Admitting: Family Medicine

## 2018-04-28 ENCOUNTER — Other Ambulatory Visit: Payer: Self-pay | Admitting: Family Medicine

## 2018-04-28 NOTE — Telephone Encounter (Signed)
Please advise.  Last seen 10/2017 last Filled 10/2017

## 2018-04-30 ENCOUNTER — Ambulatory Visit: Payer: Self-pay | Admitting: Family Medicine

## 2018-04-30 ENCOUNTER — Other Ambulatory Visit: Payer: Self-pay

## 2018-04-30 VITALS — BP 112/74 | HR 65 | Temp 97.5°F | Ht 69.0 in | Wt 181.0 lb

## 2018-04-30 DIAGNOSIS — E1122 Type 2 diabetes mellitus with diabetic chronic kidney disease: Secondary | ICD-10-CM

## 2018-04-30 DIAGNOSIS — Z794 Long term (current) use of insulin: Secondary | ICD-10-CM

## 2018-04-30 DIAGNOSIS — E785 Hyperlipidemia, unspecified: Secondary | ICD-10-CM

## 2018-04-30 DIAGNOSIS — N183 Chronic kidney disease, stage 3 unspecified: Secondary | ICD-10-CM

## 2018-04-30 DIAGNOSIS — R7989 Other specified abnormal findings of blood chemistry: Secondary | ICD-10-CM

## 2018-04-30 DIAGNOSIS — E1169 Type 2 diabetes mellitus with other specified complication: Secondary | ICD-10-CM

## 2018-04-30 LAB — BAYER DCA HB A1C WAIVED: HB A1C (BAYER DCA - WAIVED): >14 % — ABNORMAL HIGH (ref <7.0)

## 2018-04-30 NOTE — Progress Notes (Signed)
Subjective: CC: DM2 w/ HLD and CKD3 HPI: Anthony French is a 63 y.o. male presenting to clinic today for:  Type 2 Diabetes w/ HLD and CKD3:  Patient reports: Glucometer: Accucheck.  Since retirement, he has not been checking his sugars nor using his insulin as directed.  Taking medication(s): Metformin 500 mg p.o. twice daily. Not compliant w/ Novolin 70/30.  Using Lipitor qhs, Lisinopril 2.35m.  Side effects: none.  No hypoglycemic episodes.  Last eye exam: Up-to-date Last foot exam: Needs Last A1c: Lab Results  Component Value Date   HGBA1C 6.4 10/29/2017   Nephropathy screen indicated?: negative urine microalbumin in 04/2017. On ACE-I for CKD3. Last flu, zoster and/or pneumovax: UTD  Denies fever, chills, dizziness, LOC, polyuria, polydipsia, unintended weight loss/gain, foot ulcerations, or chest pain.  He denies any sensation changes in the feet.  No visual disturbance.  No shortness of breath.  ROS: Per HPI  Past Medical History:  Diagnosis Date  . [place]   . Diabetic neuropathy ([facility])    Allergies  Allergen Reactions  . Other Hives    Pt states that he is allergic to an ABT but he can not remember what it is    Current Outpatient Medications:  .  ACCU-CHEK GUIDE test strip, TEST TWICE DAILY OR AS DIRECTED, Disp: 100 each, Rfl: 1 .  atorvastatin (LIPITOR) 40 MG tablet, Take 1 tablet (40 mg total) by mouth daily., Disp: 90 tablet, Rfl: 3 .  blood glucose meter kit and supplies, Dispense based on patient and insurance preference. Use up to four times daily as directed. (FOR ICD-10 E10.9, E11.9). Check blood sugar twice a day as directed., Disp: 1 each, Rfl: 0 .  gabapentin (NEURONTIN) 300 MG capsule, TAKE 1 CAPSULE BY MOUTH IN THE MORNING AND TAKE 3 CAPSULES BY MOUTH IN THE EVENING, Disp: 360 capsule, Rfl: 0 .  insulin NPH-regular Human (NOVOLIN 70/30) (70-30) 100 UNIT/ML injection, INJECT 22 UNITS UNDER THE SKIN WITH BREAKFAST AND 24 UNITS WITH SUPPER, Disp: 10 mL,  Rfl: 1 .  lisinopril (ZESTRIL) 2.5 MG tablet, Take 1 tablet (2.5 mg total) by mouth daily., Disp: 90 tablet, Rfl: 3 .  metFORMIN (GLUCOPHAGE) 500 MG tablet, Take 1 tablet (500 mg total) by mouth 2 (two) times daily., Disp: 180 tablet, Rfl: 3 Social History   Socioeconomic History  . Marital status: Single    Spouse name: Not on file  . Number of children: Not on file  . Years of education: Not on file  . Highest education level: Not on file  Occupational History  . Not on file  Social Needs  . Financial resource strain: Not on file  . Food insecurity:    Worry: Not on file    Inability: Not on file  . Transportation needs:    Medical: Not on file    Non-medical: Not on file  Tobacco Use  . Smoking status: Never Smoker  . Smokeless tobacco: Never Used  Substance and Sexual Activity  . Alcohol use: Not on file  . Drug use: Not on file  . Sexual activity: Not on file  Lifestyle  . Physical activity:    Days per week: Not on file    Minutes per session: Not on file  . Stress: Not on file  Relationships  . Social connections:    Talks on phone: Not on file    Gets together: Not on file    Attends religious service: Not on file    Active  member of club or organization: Not on file    Attends meetings of clubs or organizations: Not on file    Relationship status: Not on file  . Intimate partner violence:    Fear of current or ex partner: Not on file    Emotionally abused: Not on file    Physically abused: Not on file    Forced sexual activity: Not on file  Other Topics Concern  . Not on file  Social History Narrative  . Not on file   Family History  Problem Relation Age of Onset  . Stroke Mother   . COPD Father   . Diabetes Sister   . Stroke Brother   . Diabetes Brother     Health Maintenance: colon cancer screening.  Objective: Office vital signs reviewed. BP 112/74   Pulse 65   Temp (!) 97.5 F (36.4 C) (Oral)   Ht _0  (1.753 m)   Wt 181 lb (82.1 kg)    BMI 26.73 kg/m   Physical Examination:  General: Awake, alert, well appearing male. No acute distress HEENT: sclera white, moist mucous membranes Cardio: regular rate and rhythm, S1S2 heard, no murmurs appreciated Pulm: clear to auscultation bilaterally, no wheezes, rhonchi or rales; normal work of breathing on room air  Assessment/ Plan: 63 y.o. male   1. Type 2 diabetes mellitus with stage 3 chronic kidney disease, with long-term current use of insulin (HCC) Grossly uncontrolled with A1c today of greater than 14.  I am sure that this is related to noncompliance as he was under excellent control in September with A1c less than 7.  I have advised him to resume use of Novolin as directed.  Monitor blood sugars at minimum once per day and record.  He will contact me in the next 4 weeks with fasting blood sugars and we will adjust his insulin over the phone if needed.  We discussed the risks of uncontrolled blood sugars and reasons for emergent evaluation emergency department.  He voiced good understanding. - Bayer DCA Hb A1c Waived  2. Hyperlipidemia associated with type 2 diabetes mellitus ([place]) Compliant with statin  3. CKD stage 3 due to type 2 diabetes mellitus (HCC) On ACE inhibitor, low-dose.  Blood pressures okay today.  Creatinine was noted to be bumped at last visit and therefore we will recheck a BMP.  4. Elevated serum creatinine - Basic Metabolic Panel    Janora Norlander, DO [place] [phone]

## 2018-04-30 NOTE — Patient Instructions (Signed)
Your sugar is not controlled.  We discussed the danger of this.  I have printed applications for medication assistance for your insulin.  Go back to using it.  Make sure you are checking your blood sugar.  Call me in 4 weeks with your daily morning blood sugars.  We will adjust medications if needed.

## 2018-05-01 ENCOUNTER — Telehealth: Payer: Self-pay | Admitting: Family Medicine

## 2018-05-01 NOTE — Telephone Encounter (Signed)
Selena is calling for pt states that someone called this morning and wanted to know what the pt Sugar was this AM and she reports it was 292

## 2018-05-01 NOTE — Telephone Encounter (Signed)
MMM aware and patient aware to keep track of his BS and continue to take insulin. Verbalizes understanding.

## 2018-05-03 LAB — BASIC METABOLIC PANEL
BUN/Creatinine Ratio: 20 (ref 10–24)
BUN: 39 mg/dL — ABNORMAL HIGH (ref 8–27)
CO2: 19 mmol/L — ABNORMAL LOW (ref 20–29)
Calcium: 9.3 mg/dL (ref 8.6–10.2)
Chloride: 99 mmol/L (ref 96–106)
Creatinine, Ser: 1.97 mg/dL — ABNORMAL HIGH (ref 0.76–1.27)
GFR calc Af Amer: 40 mL/min/{1.73_m2} — ABNORMAL LOW (ref >59)
GFR calc non Af Amer: 35 mL/min/{1.73_m2} — ABNORMAL LOW (ref >59)
Glucose: 608 mg/dL (ref 65–99)
Potassium: 5.4 mmol/L — ABNORMAL HIGH (ref 3.5–5.2)
Sodium: 133 mmol/L — ABNORMAL LOW (ref 134–144)

## 2018-05-19 ENCOUNTER — Ambulatory Visit (INDEPENDENT_AMBULATORY_CARE_PROVIDER_SITE_OTHER): Payer: Self-pay | Admitting: Family Medicine

## 2018-05-19 ENCOUNTER — Other Ambulatory Visit: Payer: Self-pay

## 2018-05-19 DIAGNOSIS — R42 Dizziness and giddiness: Secondary | ICD-10-CM

## 2018-05-19 NOTE — Progress Notes (Signed)
Telephone visit  Subjective: CC: lightheadedness PCP: Janora Norlander, DO [facility] Anthony French is a 63 y.o. male calls for telephone consult today. Patient provides verbal consent for consult held via phone.  Location of patient: home Location of provider: [facility] Others present for call: Anthony French (partner)  1. Lightheadedness Patient reports that he has been having intermittent dizziness for the last 6 weeks.  Over the last 2 weeks he has had perhaps 3 or 4 episodes that lasted about an hour before resolving.  He does not necessarily feel that it occurs with any specific movements but does seem to be relieved by sitting down and resting.  He occasionally has vertigo associated with this.  He denies any loss of consciousness, nausea, vomiting.  He feels that he is drinking adequately.  Urine output is stable per his report.  His blood sugars have been running between 150 and 300s.  He more often than not can remember to take his medicines but does state that he has missed a couple of doses of insulin here and there.  He reports that his blood pressure monitor does not work and therefore he has not been monitoring blood pressures.  His spouse gives [place] more information and states that she does not feel that he is having any strokelike changes but sometimes she feels like his personality changes a little bit.  She has not appreciated any fainting spells.   ROS: Per HPI  Allergies  Allergen Reactions  . Other Hives    Pt states that he is allergic to an ABT but he can not remember what it is   Past Medical History:  Diagnosis Date  . Diabetes ([place])   . Diabetic neuropathy ([place])     Current Outpatient Medications:  .  ACCU-CHEK GUIDE test strip, TEST TWICE DAILY OR AS DIRECTED, Disp: 100 each, Rfl: 1 .  atorvastatin (LIPITOR) 40 MG tablet, Take 1 tablet (40 mg total) by mouth daily., Disp: 90 tablet, Rfl: 3 .  blood glucose meter kit and supplies, Dispense based on patient and insurance  preference. Use up to four times daily as directed. (FOR ICD-10 E10.9, E11.9). Check blood sugar twice a day as directed., Disp: 1 each, Rfl: 0 .  gabapentin (NEURONTIN) 300 MG capsule, TAKE 1 CAPSULE BY MOUTH IN THE MORNING AND TAKE 3 CAPSULES BY MOUTH IN THE EVENING, Disp: 360 capsule, Rfl: 0 .  insulin NPH-regular Human (NOVOLIN 70/30) (70-30) 100 UNIT/ML injection, INJECT 22 UNITS UNDER THE SKIN WITH BREAKFAST AND 24 UNITS WITH SUPPER, Disp: 10 mL, Rfl: 1 .  lisinopril (ZESTRIL) 2.5 MG tablet, Take 1 tablet (2.5 mg total) by mouth daily., Disp: 90 tablet, Rfl: 3 .  metFORMIN (GLUCOPHAGE) 500 MG tablet, Take 1 tablet (500 mg total) by mouth 2 (two) times daily., Disp: 180 tablet, Rfl: 3  Assessment/ Plan: 63 y.o. male   1. Dizziness Possibly related to dehydration/orthostasis.  He had an acute on chronic kidney injury on his last laboratory draw that was presumably from significantly uncontrolled blood sugars.  I have advised him to discontinue lisinopril for now, as this medication was only added for renal protection in the first place.  He is to continue his diabetes medications as directed and monitor sugar closely.  Oral hydration reinforced.  I discussed signs and symptoms warranting emergent evaluation emergency department.  His spouse will also keep me posted on any changes that may be worrisome.  We will plan to see each other if able in the next 2  to 4 weeks for recheck.   Start time: 1:27pm End time: 1:35pm  Total time spent on patient care (including telephone call/ virtual visit): 15 minutes  [place], [place] [phone]

## 2018-06-01 ENCOUNTER — Encounter: Payer: Self-pay | Admitting: Nurse Practitioner

## 2018-06-01 ENCOUNTER — Ambulatory Visit: Payer: Self-pay | Admitting: Nurse Practitioner

## 2018-06-01 ENCOUNTER — Encounter (INDEPENDENT_AMBULATORY_CARE_PROVIDER_SITE_OTHER): Payer: Self-pay

## 2018-06-01 ENCOUNTER — Other Ambulatory Visit: Payer: Self-pay

## 2018-06-01 VITALS — BP 123/73 | HR 65 | Temp 98.0°F | Ht 69.0 in | Wt 166.0 lb

## 2018-06-01 DIAGNOSIS — S91135A Puncture wound without foreign body of left lesser toe(s) without damage to nail, initial encounter: Secondary | ICD-10-CM

## 2018-06-01 MED ORDER — SULFAMETHOXAZOLE-TRIMETHOPRIM 800-160 MG PO TABS: 1.0000 | ORAL_TABLET | Freq: Two times a day (BID) | ORAL | 0 refills | Status: DC

## 2018-06-01 NOTE — Progress Notes (Signed)
   Subjective:    Patient ID: Anthony French, male    DOB: Feb 22, 1955, 63 y.o.   MRN: [id number]   Chief Complaint: Left foot swollen (Stepped on rusty nail yesterday evening)   HPI Patient comes stating he stepped on a rusty nail and went up through his shoe and into his foot. Last tetanus shot was in 2019   Review of Systems  Skin: Positive for wound (puncture wound to ball of left foot).  All other systems reviewed and are negative.      Objective:   Physical Exam Vitals signs and nursing note reviewed.  Constitutional:      Appearance: Normal appearance.  Cardiovascular:     Rate and Rhythm: Normal rate and regular rhythm.     Heart sounds: Normal heart sounds.  Pulmonary:     Effort: Pulmonary effort is normal.     Breath sounds: Normal breath sounds.  Skin:    General: Skin is warm.     Comments: Puncture wound left foot  Neurological:     General: No focal deficit present.     Mental Status: He is alert and oriented to person, place, and time.  Psychiatric:        Mood and Affect: Mood normal.        Behavior: Behavior normal.   BP 123/73   Pulse 65   Temp 98 F (36.7 C) (Oral)   Ht 5\' 9"  (1.753 m)   Wt 166 lb (75.3 kg)   BMI 24.51 kg/m        Assessment & Plan:  Nekoda Chock in today with chief complaint of Left foot swollen (Stepped on rusty nail yesterday evening)   1. Puncture wound of fifth toe of left foot, initial encounter Soak in epsom salt bid Keep clean and dry Prophylactic antibiotic given because has fiabetes RTO if worsens  Meds ordered this encounter  Medications  . sulfamethoxazole-trimethoprim (BACTRIM DS) 800-160 MG tablet    Sig: Take 1 tablet by mouth 2 (two) times daily.    Dispense:  20 tablet    Refill:  0    Order Specific Question:   Supervising Provider    Answer:   Worthy Rancher [[id number]]   [place], FNP

## 2018-06-01 NOTE — Addendum Note (Signed)
Addended by: Chevis Pretty on: 06/01/2018 03:27 PM   Modules accepted: Level of Service

## 2018-06-07 ENCOUNTER — Other Ambulatory Visit: Payer: Self-pay

## 2018-06-07 ENCOUNTER — Ambulatory Visit (INDEPENDENT_AMBULATORY_CARE_PROVIDER_SITE_OTHER): Payer: Self-pay | Admitting: Family Medicine

## 2018-06-07 DIAGNOSIS — K529 Noninfective gastroenteritis and colitis, unspecified: Secondary | ICD-10-CM

## 2018-06-07 MED ORDER — ONDANSETRON 4 MG PO TBDP: 4.0000 mg | ORAL_TABLET | Freq: Three times a day (TID) | ORAL | 0 refills | Status: DC | PRN

## 2018-06-07 NOTE — Progress Notes (Signed)
Telephone visit  Subjective: CC: fever PCP: Janora Norlander, DO ZJI:RCVEL Anthony French is a 63 y.o. male calls for telephone consult today. Patient provides verbal consent for consult held via phone.  Location of patient: home Location of provider: Working remotely from home Others present for call: Spouse, Selena Peeden  1. Fever Much of the history is provided by patient's spouse.  She reports that he developed flulike symptoms over the last couple of days.  She reports subjective fevers that broke with antipyretics.  He has associated nonbloody diarrhea and nonbloody, nonbilious vomiting.  Last episode of vomiting was yesterday evening.  No cough, congestion, shortness of breath.  He was recently treated for a puncture wound, where he stepped on a nail last week.  He never retrieved the Septra from the pharmacy.  She reports that that wound has totally healed up.  Denies any erythema, swelling, drainage or pain at the site.  He is able to drink some fluids but is not drinking enough.  Blood sugars have been 127-140.  Denies any abdominal pain or bloating.  No dysuria.  Urine output is normal.   ROS: Per HPI  Allergies  Allergen Reactions  . Other Hives    Pt states that he is allergic to an ABT but he can not remember what it is   Past Medical History:  Diagnosis Date  . Diabetes ([place])   . Diabetic neuropathy ([place])     Current Outpatient Medications:  .  ACCU-CHEK GUIDE test strip, TEST TWICE DAILY OR AS DIRECTED, Disp: 100 each, Rfl: 1 .  atorvastatin (LIPITOR) 40 MG tablet, Take 1 tablet (40 mg total) by mouth daily., Disp: 90 tablet, Rfl: 3 .  blood glucose meter kit and supplies, Dispense based on patient and insurance preference. Use up to four times daily as directed. (FOR ICD-10 E10.9, E11.9). Check blood sugar twice a day as directed., Disp: 1 each, Rfl: 0 .  gabapentin (NEURONTIN) 300 MG capsule, TAKE 1 CAPSULE BY MOUTH IN THE MORNING AND TAKE 3 CAPSULES BY MOUTH IN THE  EVENING, Disp: 360 capsule, Rfl: 0 .  insulin NPH-regular Human (NOVOLIN 70/30) (70-30) 100 UNIT/ML injection, INJECT 22 UNITS UNDER THE SKIN WITH BREAKFAST AND 24 UNITS WITH SUPPER, Disp: 10 mL, Rfl: 1 .  lisinopril (ZESTRIL) 2.5 MG tablet, Take 1 tablet (2.5 mg total) by mouth daily., Disp: 90 tablet, Rfl: 3 .  metFORMIN (GLUCOPHAGE) 500 MG tablet, Take 1 tablet (500 mg total) by mouth 2 (two) times daily., Disp: 180 tablet, Rfl: 3 .  sulfamethoxazole-trimethoprim (BACTRIM DS) 800-160 MG tablet, Take 1 tablet by mouth 2 (two) times daily., Disp: 20 tablet, Rfl: 0  Assessment/ Plan: 63 y.o. male   1. Gastroenteritis I discussed with him that we could not rule out COVID-19 infection.  About 25% of patients infected with COVID are presenting with GI symptoms.  I have recommended that he stay home for the remainder of the week and not return to work until he has had no symptoms for 3 consecutive days unmedicated.  We discussed reasons for emergent evaluation the emergency department.  I have prescribed him Zofran to use every 8 hours if needed for nausea or vomiting.  Encouraged to push oral fluids.  Differential diagnosis considered include acute abdomen (not having any abdominal pain) versus influenza versus foodborne illness (no other sick persons within the home) versus DKA (blood sugars are within appropriate range)..  All questions answered.  He will follow-up PRN - ondansetron (ZOFRAN ODT) 4  MG disintegrating tablet; Take 1 tablet (4 mg total) by mouth every 8 (eight) hours as needed for nausea or vomiting.  Dispense: 20 tablet; Refill: 0   Start time: 10:32am End time: 10:41am  Total time spent on patient care (including telephone call/ virtual visit): 15 minutes   [place], [place] [phone]

## 2018-07-06 ENCOUNTER — Telehealth: Payer: Self-pay | Admitting: Family Medicine

## 2018-11-05 ENCOUNTER — Other Ambulatory Visit: Payer: Self-pay | Admitting: Family Medicine

## 2018-11-11 ENCOUNTER — Other Ambulatory Visit: Payer: Self-pay | Admitting: Family Medicine

## 2018-11-12 ENCOUNTER — Telehealth: Payer: Self-pay | Admitting: Family Medicine

## 2018-11-12 NOTE — Telephone Encounter (Signed)
Please have patient call insurance co to determine what is cheapest.  I have a feeling separating the insulin into 2 different insulins may be more expensive.  If having trouble affording medications, please refer to [facility] pharmacist for med assistance.

## 2018-11-12 NOTE — Telephone Encounter (Signed)
Patient aware and verbalizes understanding. 

## 2018-11-22 ENCOUNTER — Other Ambulatory Visit: Payer: Self-pay | Admitting: Family Medicine

## 2018-12-01 NOTE — Telephone Encounter (Signed)
Just received a fax from [facility] regarding pt's Novolin 70/30.  The suggested alternative from the formulary drug list is Humulin 70/30

## 2018-12-02 MED ORDER — HUMULIN 70/30 KWIKPEN (70-30) 100 UNIT/ML ~~LOC~~ SUPN: 22.0000 [IU] | PEN_INJECTOR | Freq: Every day | SUBCUTANEOUS | 11 refills | Status: DC

## 2018-12-02 NOTE — Telephone Encounter (Signed)
lmtcb

## 2018-12-02 NOTE — Telephone Encounter (Signed)
Novolin changed to Humulin. New rx sent to the pharmacy.

## 2018-12-22 ENCOUNTER — Ambulatory Visit: Payer: Self-pay | Admitting: Family Medicine

## 2018-12-27 ENCOUNTER — Encounter: Payer: Self-pay | Admitting: Family Medicine

## 2018-12-27 ENCOUNTER — Ambulatory Visit (INDEPENDENT_AMBULATORY_CARE_PROVIDER_SITE_OTHER): Payer: Medicare Other | Admitting: Family Medicine

## 2018-12-27 ENCOUNTER — Other Ambulatory Visit: Payer: Self-pay

## 2018-12-27 VITALS — BP 130/76 | HR 61 | Temp 97.5°F | Ht 69.0 in | Wt 197.0 lb

## 2018-12-27 DIAGNOSIS — Z23 Encounter for immunization: Secondary | ICD-10-CM

## 2018-12-27 DIAGNOSIS — Z794 Long term (current) use of insulin: Secondary | ICD-10-CM | POA: Diagnosis not present

## 2018-12-27 DIAGNOSIS — Z1211 Encounter for screening for malignant neoplasm of colon: Secondary | ICD-10-CM

## 2018-12-27 DIAGNOSIS — E1122 Type 2 diabetes mellitus with diabetic chronic kidney disease: Secondary | ICD-10-CM

## 2018-12-27 DIAGNOSIS — E785 Hyperlipidemia, unspecified: Secondary | ICD-10-CM | POA: Diagnosis not present

## 2018-12-27 DIAGNOSIS — N183 Chronic kidney disease, stage 3 unspecified: Secondary | ICD-10-CM | POA: Diagnosis not present

## 2018-12-27 DIAGNOSIS — E1169 Type 2 diabetes mellitus with other specified complication: Secondary | ICD-10-CM

## 2018-12-27 DIAGNOSIS — E1142 Type 2 diabetes mellitus with diabetic polyneuropathy: Secondary | ICD-10-CM | POA: Diagnosis not present

## 2018-12-27 LAB — BAYER DCA HB A1C WAIVED: HB A1C (BAYER DCA - WAIVED): 8.6 % — ABNORMAL HIGH (ref <7.0)

## 2018-12-27 MED ORDER — FREESTYLE LIBRE SENSOR SYSTEM MISC: 2 refills | Status: DC

## 2018-12-27 MED ORDER — FREESTYLE LIBRE READER DEVI: 1.0000 [IU] | Freq: Every day | 1 refills | Status: DC

## 2018-12-27 NOTE — Patient Instructions (Addendum)
Sugar is better than last time.  A1c 8.6 today.  Still have room to work though.  I'd like you to increase your insulin to 23 u in the morning and continue 24 u in the evening.  If your morning blood sugars stay above 150 when you wake up, you can go to 24 units in the morning and 24 in the afternoon.  I am sending a cologuard kit for colon cancer screening to your house.  Send it back as soon as possible.  You go your pneumonia shot today.  You had labs performed today.  You will be contacted with the results of the labs once they are available, usually in the next 3 business days for routine lab work.  If you have an active my chart account, they will be released to your MyChart.  If you prefer to have these labs released to you via telephone, please let us know.  If you had a pap smear or biopsy performed, expect to be contacted in about 7-10 days.

## 2018-12-27 NOTE — Progress Notes (Signed)
Subjective: CC: DM2 w/ HLD and CKD3 HPI: Anthony French is a 63 y.o. male presenting to clinic today for:  Type 2 Diabetes w/ HLD and CKD3:  Patient reports: Glucometer: Accucheck.  He notes that he has not been very compliant with monitoring of his blood sugar.  He has noticed some recent ones that were well into the 200s.  He describes an event recently where he became dizzy and noted to his blood sugar to be 104.  Currently he is injecting 22 units of mixed Humulin 70-30 in the morning and 24 units at bedtime.  He continues to use metformin twice daily as prescribed.  He is also compliant with his Lipitor and gabapentin for neuropathy.  Has not yet scheduled a diabetic eye exam but understands that he needs this.  He had his influenza vaccination at work but will need pneumococcal vaccination today.  Neuropathy is somewhat uncontrolled with description of burning in bilateral great toes.  Last eye exam: Up-to-date Last foot exam: Needs Last A1c: Lab Results  Component Value Date   HGBA1C >14.0 (H) 04/30/2018   Nephropathy screen indicated?: negative urine microalbumin in 04/2017. On ACE-I for CKD3. Last flu, zoster and/or pneumovax: UTD  Denies chest pain, shortness of breath, ulceration of feet.   ROS: Per HPI  Past Medical History:  Diagnosis Date  . Diabetes ([place])   . Diabetic neuropathy ([place])   . Hyperlipidemia   . Hypertension    Allergies  Allergen Reactions  . Other Hives    Pt states that he is allergic to an ABT but he can not remember what it is    Current Outpatient Medications:  .  ACCU-CHEK GUIDE test strip, TEST TWICE DAILY OR AS DIRECTED, Disp: 100 each, Rfl: 1 .  atorvastatin (LIPITOR) 40 MG tablet, TAKE 1 TABLET BY MOUTH EVERY DAY, Disp: 90 tablet, Rfl: 3 .  blood glucose meter kit and supplies, Dispense based on patient and insurance preference. Use up to four times daily as directed. (FOR ICD-10 E10.9, E11.9). Check blood sugar twice a day as directed.,  Disp: 1 each, Rfl: 0 .  gabapentin (NEURONTIN) 300 MG capsule, TAKE 1 CAPSULE BY MOUTH IN THE MORNING AND TAKE 3 CAPSULES BY MOUTH IN THE EVENING, Disp: 360 capsule, Rfl: 0 .  Insulin Isophane & Regular Human (HUMULIN 70/30 KWIKPEN) (70-30) 100 UNIT/ML PEN, Inject 22-24 Units into the skin daily with breakfast., Disp: 15 mL, Rfl: 11 .  lisinopril (ZESTRIL) 2.5 MG tablet, Take 1 tablet (2.5 mg total) by mouth daily., Disp: 90 tablet, Rfl: 3 .  metFORMIN (GLUCOPHAGE) 500 MG tablet, TAKE 1 TABLET BY MOUTH TWICE A DAY, Disp: 180 tablet, Rfl: 3 .  ondansetron (ZOFRAN ODT) 4 MG disintegrating tablet, Take 1 tablet (4 mg total) by mouth every 8 (eight) hours as needed for nausea or vomiting., Disp: 20 tablet, Rfl: 0 Social History   Socioeconomic History  . Marital status: Single    Spouse name: Not on file  . Number of children: Not on file  . Years of education: Not on file  . Highest education level: Not on file  Occupational History  . Not on file  Social Needs  . Financial resource strain: Not on file  . Food insecurity    Worry: Not on file    Inability: Not on file  . Transportation needs    Medical: Not on file    Non-medical: Not on file  Tobacco Use  . Smoking status: Never Smoker  .  Smokeless tobacco: Never Used  Substance and Sexual Activity  . Alcohol use: Not Currently  . Drug use: Never  . Sexual activity: Not on file  Lifestyle  . Physical activity    Days per week: Not on file    Minutes per session: Not on file  . Stress: Not on file  Relationships  . Social Herbalist on phone: Not on file    Gets together: Not on file    Attends religious service: Not on file    Active member of club or organization: Not on file    Attends meetings of clubs or organizations: Not on file    Relationship status: Not on file  . Intimate partner violence    Fear of current or ex partner: Not on file    Emotionally abused: Not on file    Physically abused: Not on file     Forced sexual activity: Not on file  Other Topics Concern  . Not on file  Social History Narrative  . Not on file   Family History  Problem Relation Age of Onset  . Stroke Mother   . COPD Father   . Diabetes Sister   . Stroke Brother   . Diabetes Brother     Health Maintenance: colon cancer screening.  Objective: Office vital signs reviewed. BP 130/76   Pulse 61   Temp (!) 97.5 F (36.4 C) (Temporal)   Ht 5' 9"  (1.753 m)   Wt 197 lb (89.4 kg)   SpO2 (!) 66%   BMI 29.09 kg/m   Physical Examination:  General: Awake, alert, well appearing male. No acute distress HEENT: sclera white, moist mucous membranes Cardio: regular rate and rhythm, S1S2 heard, no murmurs appreciated Pulm: clear to auscultation bilaterally, no wheezes, rhonchi or rales; normal work of breathing on room air Extremities: Warm, well perfused.  No edema.  No cyanosis or clubbing. Neuro: See diabetic foot exam below Diabetic Foot Exam - Simple   Simple Foot Form Diabetic Foot exam was performed with the following findings: Yes 12/27/2018  7:35 PM  Visual Inspection No deformities, no ulcerations, no other skin breakdown bilaterally: Yes Sensation Testing Intact to touch and monofilament testing bilaterally: Yes Pulse Check Posterior Tibialis and Dorsalis pulse intact bilaterally: Yes Comments Decreased vibratory sensation bilaterally.     Assessment/ Plan: 63 y.o. male   1. Type 2 diabetes mellitus with stage 3 chronic kidney disease, with long-term current use of insulin, unspecified whether stage 3a or 3b CKD ([place]) Not at goal but improving with A1c of 8.6 today.  I have advised him to go ahead and increase his morning insulin to 23 units every morning, continue with 24 units every night.  Monitor blood sugars and if fasting morning blood sugars continue to be above 200 despite increase can increase to 24 units twice daily.  He will follow-up in 3 months, sooner if needed.  He will schedule his  diabetic eye exam. - hgba1c - CMP14+EGFR  2. Hyperlipidemia associated with type 2 diabetes mellitus ([place]) Continue statin - CMP14+EGFR - Lipid Panel  3. Diabetic polyneuropathy associated with type 2 diabetes mellitus [facility]) May plan for increase in gabapentin pending renal function panel.  4. Screen for colon cancer Cologuard sent.  Patient average risk - Cologuard     Jeshua Ransford Windell Moulding, DO [place] [phone]

## 2018-12-27 NOTE — Addendum Note (Signed)
Addended by: Janora Norlander on: 12/27/2018 08:31 PM   Modules accepted: Orders

## 2018-12-28 ENCOUNTER — Encounter: Payer: Self-pay | Admitting: Family Medicine

## 2018-12-28 ENCOUNTER — Other Ambulatory Visit: Payer: Self-pay | Admitting: Family Medicine

## 2018-12-28 DIAGNOSIS — E1122 Type 2 diabetes mellitus with diabetic chronic kidney disease: Secondary | ICD-10-CM

## 2018-12-28 DIAGNOSIS — N183 Chronic kidney disease, stage 3 unspecified: Secondary | ICD-10-CM

## 2018-12-28 LAB — CMP14+EGFR
ALT: 14 IU/L (ref 0–44)
AST: 13 IU/L (ref 0–40)
Albumin/Globulin Ratio: 2.4 — ABNORMAL HIGH (ref 1.2–2.2)
Albumin: 4.6 g/dL (ref 3.8–4.8)
Alkaline Phosphatase: 85 IU/L (ref 39–117)
BUN/Creatinine Ratio: 20 (ref 10–24)
BUN: 38 mg/dL — ABNORMAL HIGH (ref 8–27)
Bilirubin Total: 0.5 mg/dL (ref 0.0–1.2)
CO2: 19 mmol/L — ABNORMAL LOW (ref 20–29)
Calcium: 9.2 mg/dL (ref 8.6–10.2)
Chloride: 102 mmol/L (ref 96–106)
Creatinine, Ser: 1.86 mg/dL — ABNORMAL HIGH (ref 0.76–1.27)
GFR calc Af Amer: 43 mL/min/{1.73_m2} — ABNORMAL LOW (ref >59)
GFR calc non Af Amer: 37 mL/min/{1.73_m2} — ABNORMAL LOW (ref >59)
Globulin, Total: 1.9 g/dL (ref 1.5–4.5)
Glucose: 332 mg/dL — ABNORMAL HIGH (ref 65–99)
Potassium: 4.7 mmol/L (ref 3.5–5.2)
Sodium: 139 mmol/L (ref 134–144)
Total Protein: 6.5 g/dL (ref 6.0–8.5)

## 2018-12-28 LAB — LIPID PANEL
Chol/HDL Ratio: 3.6 ratio (ref 0.0–5.0)
Cholesterol, Total: 141 mg/dL (ref 100–199)
HDL: 39 mg/dL — ABNORMAL LOW (ref >39)
LDL Chol Calc (NIH): 71 mg/dL (ref 0–99)
Triglycerides: 183 mg/dL — ABNORMAL HIGH (ref 0–149)
VLDL Cholesterol Cal: 31 mg/dL (ref 5–40)

## 2019-01-11 DIAGNOSIS — E133293 Other specified diabetes mellitus with mild nonproliferative diabetic retinopathy without macular edema, bilateral: Secondary | ICD-10-CM | POA: Diagnosis not present

## 2019-01-11 DIAGNOSIS — H2513 Age-related nuclear cataract, bilateral: Secondary | ICD-10-CM | POA: Diagnosis not present

## 2019-01-11 LAB — HM DIABETES EYE EXAM

## 2019-01-21 DIAGNOSIS — D638 Anemia in other chronic diseases classified elsewhere: Secondary | ICD-10-CM | POA: Diagnosis not present

## 2019-01-21 DIAGNOSIS — R809 Proteinuria, unspecified: Secondary | ICD-10-CM | POA: Diagnosis not present

## 2019-01-21 DIAGNOSIS — I129 Hypertensive chronic kidney disease with stage 1 through stage 4 chronic kidney disease, or unspecified chronic kidney disease: Secondary | ICD-10-CM | POA: Diagnosis not present

## 2019-01-21 DIAGNOSIS — E1122 Type 2 diabetes mellitus with diabetic chronic kidney disease: Secondary | ICD-10-CM | POA: Diagnosis not present

## 2019-01-21 DIAGNOSIS — N189 Chronic kidney disease, unspecified: Secondary | ICD-10-CM | POA: Diagnosis not present

## 2019-02-05 ENCOUNTER — Other Ambulatory Visit: Payer: Self-pay | Admitting: Family Medicine

## 2019-02-25 ENCOUNTER — Other Ambulatory Visit (HOSPITAL_COMMUNITY): Payer: Self-pay | Admitting: Nephrology

## 2019-02-25 ENCOUNTER — Other Ambulatory Visit: Payer: Self-pay | Admitting: Nephrology

## 2019-02-25 DIAGNOSIS — N189 Chronic kidney disease, unspecified: Secondary | ICD-10-CM

## 2019-02-25 DIAGNOSIS — E1122 Type 2 diabetes mellitus with diabetic chronic kidney disease: Secondary | ICD-10-CM

## 2019-02-28 ENCOUNTER — Other Ambulatory Visit: Payer: Self-pay

## 2019-02-28 ENCOUNTER — Other Ambulatory Visit: Payer: Medicare Other

## 2019-02-28 DIAGNOSIS — N189 Chronic kidney disease, unspecified: Secondary | ICD-10-CM | POA: Diagnosis not present

## 2019-02-28 DIAGNOSIS — R809 Proteinuria, unspecified: Secondary | ICD-10-CM | POA: Diagnosis not present

## 2019-02-28 DIAGNOSIS — E1129 Type 2 diabetes mellitus with other diabetic kidney complication: Secondary | ICD-10-CM | POA: Diagnosis not present

## 2019-02-28 DIAGNOSIS — Z79899 Other long term (current) drug therapy: Secondary | ICD-10-CM | POA: Diagnosis not present

## 2019-02-28 DIAGNOSIS — E1122 Type 2 diabetes mellitus with diabetic chronic kidney disease: Secondary | ICD-10-CM | POA: Diagnosis not present

## 2019-02-28 DIAGNOSIS — E559 Vitamin D deficiency, unspecified: Secondary | ICD-10-CM | POA: Diagnosis not present

## 2019-02-28 DIAGNOSIS — I129 Hypertensive chronic kidney disease with stage 1 through stage 4 chronic kidney disease, or unspecified chronic kidney disease: Secondary | ICD-10-CM | POA: Diagnosis not present

## 2019-03-03 ENCOUNTER — Encounter (HOSPITAL_COMMUNITY): Payer: Self-pay

## 2019-03-03 ENCOUNTER — Ambulatory Visit (HOSPITAL_COMMUNITY): Payer: Medicare Other | Attending: Nephrology

## 2019-03-28 ENCOUNTER — Other Ambulatory Visit: Payer: Self-pay

## 2019-03-29 ENCOUNTER — Ambulatory Visit: Payer: Medicare Other | Admitting: Family Medicine

## 2019-04-13 ENCOUNTER — Ambulatory Visit (HOSPITAL_COMMUNITY): Admission: RE | Admit: 2019-04-13 | Payer: Medicare Other | Source: Ambulatory Visit

## 2019-04-13 ENCOUNTER — Encounter (HOSPITAL_COMMUNITY): Payer: Self-pay

## 2019-04-14 ENCOUNTER — Ambulatory Visit (INDEPENDENT_AMBULATORY_CARE_PROVIDER_SITE_OTHER): Payer: Medicare Other | Admitting: Family Medicine

## 2019-04-14 ENCOUNTER — Encounter: Payer: Self-pay | Admitting: Family Medicine

## 2019-04-14 ENCOUNTER — Other Ambulatory Visit: Payer: Self-pay

## 2019-04-14 VITALS — BP 133/88 | HR 93 | Temp 97.3°F | Ht 69.0 in | Wt 200.0 lb

## 2019-04-14 DIAGNOSIS — Z794 Long term (current) use of insulin: Secondary | ICD-10-CM | POA: Diagnosis not present

## 2019-04-14 DIAGNOSIS — N183 Chronic kidney disease, stage 3 unspecified: Secondary | ICD-10-CM | POA: Diagnosis not present

## 2019-04-14 DIAGNOSIS — E1142 Type 2 diabetes mellitus with diabetic polyneuropathy: Secondary | ICD-10-CM

## 2019-04-14 DIAGNOSIS — E785 Hyperlipidemia, unspecified: Secondary | ICD-10-CM | POA: Diagnosis not present

## 2019-04-14 DIAGNOSIS — E1122 Type 2 diabetes mellitus with diabetic chronic kidney disease: Secondary | ICD-10-CM | POA: Diagnosis not present

## 2019-04-14 DIAGNOSIS — E1169 Type 2 diabetes mellitus with other specified complication: Secondary | ICD-10-CM

## 2019-04-14 DIAGNOSIS — E1165 Type 2 diabetes mellitus with hyperglycemia: Secondary | ICD-10-CM

## 2019-04-14 LAB — BASIC METABOLIC PANEL
BUN/Creatinine Ratio: 13 (ref 10–24)
BUN: 29 mg/dL — ABNORMAL HIGH (ref 8–27)
CO2: 18 mmol/L — ABNORMAL LOW (ref 20–29)
Calcium: 9.5 mg/dL (ref 8.6–10.2)
Chloride: 100 mmol/L (ref 96–106)
Creatinine, Ser: 2.16 mg/dL — ABNORMAL HIGH (ref 0.76–1.27)
GFR calc Af Amer: 36 mL/min/{1.73_m2} — ABNORMAL LOW (ref >59)
GFR calc non Af Amer: 31 mL/min/{1.73_m2} — ABNORMAL LOW (ref >59)
Glucose: 461 mg/dL (ref 65–99)
Potassium: 4.8 mmol/L (ref 3.5–5.2)
Sodium: 135 mmol/L (ref 134–144)

## 2019-04-14 LAB — BAYER DCA HB A1C WAIVED: HB A1C (BAYER DCA - WAIVED): 13.3 % — ABNORMAL HIGH (ref <7.0)

## 2019-04-14 MED ORDER — HUMULIN 70/30 KWIKPEN (70-30) 100 UNIT/ML ~~LOC~~ SUPN: 24.0000 [IU] | PEN_INJECTOR | Freq: Two times a day (BID) | SUBCUTANEOUS | 11 refills | Status: DC

## 2019-04-14 NOTE — Patient Instructions (Addendum)
Go up to 24 units of insulin twice daily.  Monitor you blood sugar each morning and each evening and record.  [employer] requires you to check you blood sugar 3-4 times daily for them to pay for the monitor.  Send your stool kit back!   You are on the max dose of Metformin.  Take 1 tablet twice daily.

## 2019-04-14 NOTE — Progress Notes (Signed)
Subjective: CC: DM2 w/ HLD and CKD3 HPI: Anthony French is a 64 y.o. male presenting to clinic today for:  Type 2 Diabetes w/ HLD and CKD3:  He is not monitoring his BGs.  He needs new insulin needles but is unsure as to which ones he uses.  He did not increase the humalog to 24u BID as we discussed last visit.  He continues to use metformin twice daily as prescribed.  He is also compliant with his Lipitor and gabapentin for neuropathy.  Has not follow up with Dr Theador Hawthorne.  Is supposed to get a renal ultrasound soon.  Last eye exam: Up-to-date Last foot exam: UTD Last A1c: Lab Results  Component Value Date   HGBA1C 8.6 (H) 12/27/2018   Nephropathy screen indicated?: On ACE-I for CKD3. Last flu, zoster and/or pneumovax:  Immunization History  Administered Date(s) Administered  . Influenza,inj,Quad PF,6+ Mos 12/25/2016  . Pneumococcal Conjugate-13 12/27/2018  . Pneumococcal Polysaccharide-23 04/16/2017  . Tdap 08/19/2017    Denies chest pain, shortness of breath, ulceration of feet. No visual disturbance, polyuria, polydipsia.  Reports normal urine output.  No use of NSAIDs.   ROS: Per HPI  Past Medical History:  Diagnosis Date  . Diabetes ([place])   . Diabetic neuropathy ([place])   . Hyperlipidemia   . Hypertension    Allergies  Allergen Reactions  . Other Hives    Pt states that he is allergic to an ABT but he can not remember what it is    Current Outpatient Medications:  .  ACCU-CHEK GUIDE test strip, TEST TWICE DAILY OR AS DIRECTED, Disp: 100 each, Rfl: 1 .  atorvastatin (LIPITOR) 40 MG tablet, TAKE 1 TABLET BY MOUTH EVERY DAY, Disp: 90 tablet, Rfl: 3 .  blood glucose meter kit and supplies, Dispense based on patient and insurance preference. Use up to four times daily as directed. (FOR ICD-10 E10.9, E11.9). Check blood sugar twice a day as directed., Disp: 1 each, Rfl: 0 .  Continuous Blood Gluc Receiver (FREESTYLE LIBRE READER) DEVI, 1 Units by Does not apply route  daily. Used to check blood sugar daily as directed . E11.22, Disp: 1 each, Rfl: 1 .  Continuous Blood Gluc Sensor ([place]) MISC, Use to monitor blood sugar as directed E11.22, Disp: 2 each, Rfl: 2 .  gabapentin (NEURONTIN) 300 MG capsule, TAKE 1 CAPSULE BY MOUTH IN THE MORNING AND TAKE 3 CAPSULES BY MOUTH IN THE EVENING, Disp: 120 capsule, Rfl: 1 .  Insulin Isophane & Regular Human (HUMULIN 70/30 KWIKPEN) (70-30) 100 UNIT/ML PEN, Inject 22-24 Units into the skin daily with breakfast., Disp: 15 mL, Rfl: 11 .  metFORMIN (GLUCOPHAGE) 500 MG tablet, TAKE 1 TABLET BY MOUTH TWICE A DAY, Disp: 180 tablet, Rfl: 3 Social History   Socioeconomic History  . Marital status: Single    Spouse name: Not on file  . Number of children: Not on file  . Years of education: Not on file  . Highest education level: Not on file  Occupational History  . Not on file  Tobacco Use  . Smoking status: Never Smoker  . Smokeless tobacco: Never Used  Substance and Sexual Activity  . Alcohol use: Not Currently  . Drug use: Never  . Sexual activity: Not on file  Other Topics Concern  . Not on file  Social History Narrative  . Not on file   Social Determinants of Health   Financial Resource Strain:   . Difficulty of Paying Living  Expenses: Not on file  Food Insecurity:   . Worried About Charity fundraiser in the Last Year: Not on file  . Ran Out of Food in the Last Year: Not on file  Transportation Needs:   . Lack of Transportation (Medical): Not on file  . Lack of Transportation (Non-Medical): Not on file  Physical Activity:   . Days of Exercise per Week: Not on file  . Minutes of Exercise per Session: Not on file  Stress:   . Feeling of Stress : Not on file  Social Connections:   . Frequency of Communication with Friends and Family: Not on file  . Frequency of Social Gatherings with Friends and Family: Not on file  . Attends Religious Services: Not on file  . Active Member of  Clubs or Organizations: Not on file  . Attends Archivist Meetings: Not on file  . Marital Status: Not on file  Intimate Partner Violence:   . Fear of Current or Ex-Partner: Not on file  . Emotionally Abused: Not on file  . Physically Abused: Not on file  . Sexually Abused: Not on file   Family History  Problem Relation Age of Onset  . Stroke Mother   . COPD Father   . Diabetes Sister   . Stroke Brother   . Diabetes Brother     Health Maintenance: colon cancer screening.  Objective: Office vital signs reviewed. BP 133/88   Pulse 93   Temp (!) 97.3 F (36.3 C) (Temporal)   Ht _0  (1.753 m)   Wt 200 lb (90.7 kg)   SpO2 98%   BMI 29.53 kg/m   Physical Examination:  General: Awake, alert, well appearing male. No acute distress HEENT: sclera white, moist mucous membranes Cardio: regular rate and rhythm, S1S2 heard, no murmurs appreciated Pulm: clear to auscultation bilaterally, no wheezes, rhonchi or rales; normal work of breathing on room air Extremities: Warm, well perfused.  No edema.  No cyanosis or clubbing.  Assessment/ Plan: 64 y.o. male   1. Uncontrolled type 2 diabetes mellitus with hyperglycemia (HCC) A1c with a steep incline today at 13.3.  Denies use of steroid/ change in diet.  I suspect noncompliance with medications.  I have placed referral to endocrine to see if they have recommendations.  He is interested in insulin pump.  I stressed the importance of BG monitoring.  He will monitor at minimum qam and qpm.  A log was provided to him today. - Bayer DCA Hb A1c Waived - Basic Metabolic Panel - insulin isophane & regular human (HUMULIN 70/30 KWIKPEN) (70-30) 100 UNIT/ML KwikPen; Inject 24-26 Units into the skin in the morning and at bedtime.  Dispense: 15 mL; Refill: 11 - Ambulatory referral to Endocrinology  2. Hyperlipidemia associated with type 2 diabetes mellitus ([place]) Continue statin  3. CKD stage 3 due to type 2 diabetes mellitus  ([place]) Follow up with nephrology as directed  4. Diabetic polyneuropathy associated with type 2 diabetes mellitus ([place]) Stable   Lindia Garms Windell Moulding, DO [place] [phone]

## 2019-04-15 ENCOUNTER — Telehealth: Payer: Self-pay | Admitting: Family Medicine

## 2019-04-15 MED ORDER — PEN NEEDLES 31G X 8 MM MISC: 1.0000 | Freq: Two times a day (BID) | 11 refills | Status: AC

## 2019-04-15 NOTE — Telephone Encounter (Signed)
Insulin Pen needles sent

## 2019-04-19 ENCOUNTER — Other Ambulatory Visit: Payer: Self-pay | Admitting: Family Medicine

## 2019-04-22 ENCOUNTER — Other Ambulatory Visit: Payer: Self-pay

## 2019-04-22 ENCOUNTER — Ambulatory Visit (HOSPITAL_COMMUNITY)
Admission: RE | Admit: 2019-04-22 | Discharge: 2019-04-22 | Disposition: A | Discharge status: Home / Self Care | Payer: Medicare Other | Source: Ambulatory Visit | Attending: Nephrology | Admitting: Nephrology

## 2019-04-22 DIAGNOSIS — E1122 Type 2 diabetes mellitus with diabetic chronic kidney disease: Secondary | ICD-10-CM | POA: Diagnosis not present

## 2019-04-22 DIAGNOSIS — N189 Chronic kidney disease, unspecified: Secondary | ICD-10-CM | POA: Diagnosis not present

## 2019-04-25 DIAGNOSIS — Z79899 Other long term (current) drug therapy: Secondary | ICD-10-CM | POA: Diagnosis not present

## 2019-04-25 DIAGNOSIS — R809 Proteinuria, unspecified: Secondary | ICD-10-CM | POA: Diagnosis not present

## 2019-04-25 DIAGNOSIS — N189 Chronic kidney disease, unspecified: Secondary | ICD-10-CM | POA: Diagnosis not present

## 2019-04-25 DIAGNOSIS — D638 Anemia in other chronic diseases classified elsewhere: Secondary | ICD-10-CM | POA: Diagnosis not present

## 2019-04-25 DIAGNOSIS — I129 Hypertensive chronic kidney disease with stage 1 through stage 4 chronic kidney disease, or unspecified chronic kidney disease: Secondary | ICD-10-CM | POA: Diagnosis not present

## 2019-05-05 ENCOUNTER — Other Ambulatory Visit: Payer: Self-pay | Admitting: Family Medicine

## 2019-05-05 LAB — HEMOGLOBIN A1C: Hemoglobin A1C: 9

## 2019-05-11 ENCOUNTER — Encounter (HOSPITAL_COMMUNITY): Payer: Self-pay | Admitting: Hematology

## 2019-05-11 ENCOUNTER — Inpatient Hospital Stay (HOSPITAL_COMMUNITY): Payer: Medicare Other | Attending: Hematology | Admitting: Hematology

## 2019-05-11 ENCOUNTER — Inpatient Hospital Stay (HOSPITAL_COMMUNITY): Payer: Medicare Other

## 2019-05-11 ENCOUNTER — Other Ambulatory Visit: Payer: Self-pay

## 2019-05-11 VITALS — BP 137/74 | HR 59 | Temp 96.8°F | Resp 17 | Ht 70.5 in | Wt 206.0 lb

## 2019-05-11 DIAGNOSIS — R7989 Other specified abnormal findings of blood chemistry: Secondary | ICD-10-CM

## 2019-05-11 DIAGNOSIS — D696 Thrombocytopenia, unspecified: Secondary | ICD-10-CM | POA: Diagnosis not present

## 2019-05-11 DIAGNOSIS — R161 Splenomegaly, not elsewhere classified: Secondary | ICD-10-CM | POA: Insufficient documentation

## 2019-05-11 DIAGNOSIS — Z794 Long term (current) use of insulin: Secondary | ICD-10-CM | POA: Diagnosis not present

## 2019-05-11 DIAGNOSIS — N1832 Chronic kidney disease, stage 3b: Secondary | ICD-10-CM | POA: Diagnosis not present

## 2019-05-11 DIAGNOSIS — E1122 Type 2 diabetes mellitus with diabetic chronic kidney disease: Secondary | ICD-10-CM | POA: Diagnosis not present

## 2019-05-11 LAB — COMPREHENSIVE METABOLIC PANEL
ALT: 21 U/L (ref 0–44)
AST: 17 U/L (ref 15–41)
Albumin: 4.4 g/dL (ref 3.5–5.0)
Alkaline Phosphatase: 72 U/L (ref 38–126)
Anion gap: 11 (ref 5–15)
BUN: 24 mg/dL — ABNORMAL HIGH (ref 8–23)
CO2: 24 mmol/L (ref 22–32)
Calcium: 9.1 mg/dL (ref 8.9–10.3)
Chloride: 106 mmol/L (ref 98–111)
Creatinine, Ser: 1.71 mg/dL — ABNORMAL HIGH (ref 0.61–1.24)
GFR calc Af Amer: 48 mL/min — ABNORMAL LOW (ref >60)
GFR calc non Af Amer: 41 mL/min — ABNORMAL LOW (ref >60)
Glucose, Bld: 272 mg/dL — ABNORMAL HIGH (ref 70–99)
Potassium: 5 mmol/L (ref 3.5–5.1)
Sodium: 141 mmol/L (ref 135–145)
Total Bilirubin: 0.8 mg/dL (ref 0.3–1.2)
Total Protein: 6.7 g/dL (ref 6.5–8.1)

## 2019-05-11 LAB — CBC WITH DIFFERENTIAL/PLATELET
Abs Immature Granulocytes: 0.01 10*3/uL (ref 0.00–0.07)
Basophils Absolute: 0 10*3/uL (ref 0.0–0.1)
Basophils Relative: 1 %
Eosinophils Absolute: 0.1 10*3/uL (ref 0.0–0.5)
Eosinophils Relative: 2 %
HCT: 38.8 % — ABNORMAL LOW (ref 39.0–52.0)
Hemoglobin: 13.4 g/dL (ref 13.0–17.0)
Immature Granulocytes: 0 %
Lymphocytes Relative: 26 %
Lymphs Abs: 1.1 10*3/uL (ref 0.7–4.0)
MCH: 29.8 pg (ref 26.0–34.0)
MCHC: 34.5 g/dL (ref 30.0–36.0)
MCV: 86.2 fL (ref 80.0–100.0)
Monocytes Absolute: 0.4 10*3/uL (ref 0.1–1.0)
Monocytes Relative: 9 %
Neutro Abs: 2.6 10*3/uL (ref 1.7–7.7)
Neutrophils Relative %: 62 %
Platelets: 154 10*3/uL (ref 150–400)
RBC: 4.5 MIL/uL (ref 4.22–5.81)
RDW: 13.3 % (ref 11.5–15.5)
WBC: 4.2 10*3/uL (ref 4.0–10.5)
nRBC: 0 % (ref 0.0–0.2)

## 2019-05-11 LAB — IRON AND TIBC
Iron: 87 ug/dL (ref 45–182)
Saturation Ratios: 28 % (ref 17.9–39.5)
TIBC: 311 ug/dL (ref 250–450)
UIBC: 224 ug/dL

## 2019-05-11 LAB — LACTATE DEHYDROGENASE: LDH: 181 U/L (ref 98–192)

## 2019-05-11 LAB — FOLATE: Folate: 11.3 ng/mL (ref >5.9)

## 2019-05-11 LAB — HEPATITIS B CORE ANTIBODY, IGM: Hep B C IgM: NONREACTIVE

## 2019-05-11 LAB — VITAMIN D 25 HYDROXY (VIT D DEFICIENCY, FRACTURES): Vit D, 25-Hydroxy: 39.22 ng/mL (ref 30–100)

## 2019-05-11 LAB — FERRITIN: Ferritin: 467 ng/mL — ABNORMAL HIGH (ref 24–336)

## 2019-05-11 LAB — VITAMIN B12: Vitamin B-12: 273 pg/mL (ref 180–914)

## 2019-05-11 NOTE — Progress Notes (Signed)
CONSULT NOTE  Patient Care Team: Janora Norlander, DO as PCP - General (Family Medicine)  CHIEF COMPLAINTS/PURPOSE OF CONSULTATION: Thrombocytopenia  HISTORY OF PRESENTING ILLNESS:  Anthony French 64 y.o. male was sent here by her nephrologist for thrombocytopenia.  It is documented patient's platelets were 134.  Patient also has an enlarged spleen which was an incidental finding seen on an ultrasound.  Patient denies any B symptoms including weight loss, fevers, night sweats or chills.  Patient denies any new medications including antibiotics or steroids.  Patient denies any smoking alcohol or illicit drug use.  He reports he has never had a blood transfusion.  He has not noticed any recent bleeding such as epistasis, hematuria or hematochezia.  He denies any chest pain on exertion, shortness of breath on minimal exertion, presyncopal episodes or palpitations.  He denies any antiplatelet agents.  He has no prior history or diagnosis of cancer.  He denies any pica.  He denies any oral iron supplementation.  He has a negative family history for any cancer.   MEDICAL HISTORY:  Past Medical History:  Diagnosis Date  . Diabetes ([place])   . Diabetic neuropathy ([place])   . Hyperlipidemia     SURGICAL HISTORY: Past Surgical History:  Procedure Laterality Date  . APPENDECTOMY  1980  . CYST REMOVAL TRUNK      SOCIAL HISTORY: Social History   Socioeconomic History  . Marital status: Single    Spouse name: Not on file  . Number of children: 3  . Years of education: Not on file  . Highest education level: Not on file  Occupational History  . Occupation: retired  Tobacco Use  . Smoking status: Never Smoker  . Smokeless tobacco: Never Used  Substance and Sexual Activity  . Alcohol use: Not Currently  . Drug use: Never  . Sexual activity: Not Currently  Other Topics Concern  . Not on file  Social History Narrative  . Not on file   Social Determinants of Health   Financial Resource  Strain:   . Difficulty of Paying Living Expenses:   Food Insecurity:   . Worried About Charity fundraiser in the Last Year:   . Arboriculturist in the Last Year:   Transportation Needs:   . Film/video editor (Medical):   Marland Kitchen Lack of Transportation (Non-Medical):   Physical Activity:   . Days of Exercise per Week:   . Minutes of Exercise per Session:   Stress:   . Feeling of Stress :   Social Connections:   . Frequency of Communication with Friends and Family:   . Frequency of Social Gatherings with Friends and Family:   . Attends Religious Services:   . Active Member of Clubs or Organizations:   . Attends Archivist Meetings:   Marland Kitchen Marital Status:   Intimate Partner Violence:   . Fear of Current or Ex-Partner:   . Emotionally Abused:   Marland Kitchen Physically Abused:   . Sexually Abused:     FAMILY HISTORY: Family History  Problem Relation Age of Onset  . Stroke Mother   . COPD Father   . Diabetes Sister   . Stroke Brother   . Diabetes Brother   . Arthritis Maternal Grandmother     ALLERGIES:  is allergic to other.  MEDICATIONS:  Current Outpatient Medications  Medication Sig Dispense Refill  . atorvastatin (LIPITOR) 40 MG tablet TAKE 1 TABLET BY MOUTH EVERY DAY 90 tablet 3  . blood  glucose meter kit and supplies Dispense based on patient and insurance preference. Use up to four times daily as directed. (FOR ICD-10 E10.9, E11.9). Check blood sugar twice a day as directed. 1 each 0  . gabapentin (NEURONTIN) 300 MG capsule TAKE 1 CAPSULE BY MOUTH IN THE MORNING AND TAKE 3 CAPSULES BY MOUTH IN THE EVENING 120 capsule 1  . glucose blood (ACCU-CHEK GUIDE) test strip Test BS BID Dx E11.9 200 strip 3  . insulin NPH-regular Human (70-30) 100 UNIT/ML injection Inject 22 Units into the skin daily with breakfast. 24 units with supper.    . Insulin Pen Needle (PEN NEEDLES) 31G X 8 MM MISC 1 each by Does not apply route in the morning and at bedtime. 100 each 11  . metFORMIN  (GLUCOPHAGE) 500 MG tablet TAKE 1 TABLET BY MOUTH TWICE A DAY 180 tablet 3  . Vitamin D, Ergocalciferol, (DRISDOL) 1.25 MG (50000 UNIT) CAPS capsule Take 50,000 Units by mouth once a week.     No current facility-administered medications for this visit.    REVIEW OF SYSTEMS:   Constitutional: Denies fevers, chills or abnormal night sweats Respiratory: Denies cough, dyspnea or wheezes Cardiovascular: Denies palpitation, chest discomfort or lower extremity swelling Gastrointestinal:  Denies nausea, heartburn or change in bowel habits Skin: Denies abnormal skin rashes Lymphatics: Denies new lymphadenopathy or easy bruising Neurological: Positive for numbness in the feet. Behavioral/Psych: Mood is stable, no new changes  All other systems were reviewed with the patient and are negative.  PHYSICAL EXAMINATION: ECOG PERFORMANCE STATUS: 0 - Asymptomatic  Vitals:   05/11/19 1122  BP: 137/74  Pulse: (!) 59  Resp: 17  Temp: (!) 96.8 F (36 C)  SpO2: 100%   Filed Weights   05/11/19 1122  Weight: 206 lb (93.4 kg)    GENERAL:alert, no distress and comfortable SKIN: skin color, texture, turgor are normal, no rashes or significant lesions NECK: supple, thyroid normal size, non-tender, without nodularity LYMPH:  no palpable lymphadenopathy in the cervical, axillary or inguinal LUNGS: clear to auscultation and percussion with normal breathing effort HEART: regular rate & rhythm and no murmurs and no lower extremity edema ABDOMEN:abdomen soft, non-tender and normal bowel sounds Musculoskeletal:no cyanosis of digits and no clubbing  PSYCH: alert & oriented x 3 with fluent speech NEURO: no focal motor/sensory deficits  LABORATORY DATA:  I have reviewed the data as listed Recent Results (from the past 2160 hour(s))  Bayer DCA Hb A1c Waived     Status: Abnormal   Collection Time: 04/14/19 11:11 AM  Result Value Ref Range   HB A1C (BAYER DCA - WAIVED) 13.3 (H) <7.0 %    Comment:                                        Diabetic Adult            <7.0                                       Healthy Adult        4.3 - 5.7                                                           (  DCCT/NGSP) American Diabetes Association's Summary of Glycemic Recommendations for Adults with Diabetes: Hemoglobin A1c <7.0%. More stringent glycemic goals (A1c <6.0%) may further reduce complications at the cost of increased risk of hypoglycemia.   Basic Metabolic Panel     Status: Abnormal   Collection Time: 04/14/19 11:28 AM  Result Value Ref Range   Glucose 461 (HH) 65 - 99 mg/dL    Comment:                   Client Requested Flag   BUN 29 (H) 8 - 27 mg/dL   Creatinine, Ser 2.16 (H) 0.76 - 1.27 mg/dL   GFR calc non Af Amer 31 (L) >59 mL/min/1.73   GFR calc Af Amer 36 (L) >59 mL/min/1.73   BUN/Creatinine Ratio 13 10 - 24   Sodium 135 134 - 144 mmol/L   Potassium 4.8 3.5 - 5.2 mmol/L   Chloride 100 96 - 106 mmol/L   CO2 18 (L) 20 - 29 mmol/L   Calcium 9.5 8.6 - 10.2 mg/dL  CBC with Differential/Platelet     Status: Abnormal   Collection Time: 05/11/19 12:54 PM  Result Value Ref Range   WBC 4.2 4.0 - 10.5 K/uL   RBC 4.50 4.22 - 5.81 MIL/uL   Hemoglobin 13.4 13.0 - 17.0 g/dL   HCT 38.8 (L) 39.0 - 52.0 %   MCV 86.2 80.0 - 100.0 fL   MCH 29.8 26.0 - 34.0 pg   MCHC 34.5 30.0 - 36.0 g/dL   RDW 13.3 11.5 - 15.5 %   Platelets 154 150 - 400 K/uL   nRBC 0.0 0.0 - 0.2 %   Neutrophils Relative % 62 %   Neutro Abs 2.6 1.7 - 7.7 K/uL   Lymphocytes Relative 26 %   Lymphs Abs 1.1 0.7 - 4.0 K/uL   Monocytes Relative 9 %   Monocytes Absolute 0.4 0.1 - 1.0 K/uL   Eosinophils Relative 2 %   Eosinophils Absolute 0.1 0.0 - 0.5 K/uL   Basophils Relative 1 %   Basophils Absolute 0.0 0.0 - 0.1 K/uL   Immature Granulocytes 0 %   Abs Immature Granulocytes 0.01 0.00 - 0.07 K/uL    Comment: Performed at [facility], [address], [place], [place] [address]  Comprehensive metabolic panel     Status: Abnormal    Collection Time: 05/11/19 12:54 PM  Result Value Ref Range   Sodium 141 135 - 145 mmol/L   Potassium 5.0 3.5 - 5.1 mmol/L   Chloride 106 98 - 111 mmol/L   CO2 24 22 - 32 mmol/L   Glucose, Bld 272 (H) 70 - 99 mg/dL    Comment: Glucose reference range applies only to samples taken after fasting for at least 8 hours.   BUN 24 (H) 8 - 23 mg/dL   Creatinine, Ser 1.71 (H) 0.61 - 1.24 mg/dL   Calcium 9.1 8.9 - 10.3 mg/dL   Total Protein 6.7 6.5 - 8.1 g/dL   Albumin 4.4 3.5 - 5.0 g/dL   AST 17 15 - 41 U/L   ALT 21 0 - 44 U/L   Alkaline Phosphatase 72 38 - 126 U/L   Total Bilirubin 0.8 0.3 - 1.2 mg/dL   GFR calc non Af Amer 41 (L) >60 mL/min   GFR calc Af Amer 48 (L) >60 mL/min   Anion gap 11 5 - 15    Comment: Performed at [facility], [address] [place], [place] [address]  Lactate dehydrogenase  Status: None   Collection Time: 05/11/19 12:54 PM  Result Value Ref Range   LDH 181 98 - 192 U/L    Comment: Performed at [facility], [address], [place], [place] [address]  Hepatitis B core antibody, IgM     Status: None   Collection Time: 05/11/19 12:54 PM  Result Value Ref Range   Hep B C IgM NON REACTIVE NON REACTIVE    Comment: Performed at [facility] [address], [place], [place] [address]  VITAMIN D 25 Hydroxy (Vit-D Deficiency, Fractures)     Status: None   Collection Time: 05/11/19 12:54 PM  Result Value Ref Range   Vit D, 25-Hydroxy 39.22 30 - 100 ng/mL    Comment: (NOTE) Vitamin D deficiency has been defined by the [place] practice guideline as a level of serum 25-OH  vitamin D less than 20 ng/mL (1,2). The Endocrine Society went on to  further define vitamin D insufficiency as a level between 21 and 29  ng/mL (2). 1. IOM (Institute of Medicine). 2010. Dietary reference intakes for  calcium and D. [place]: The [employer]. 2. Holick MF, Binkley [place], Bischoff-Ferrari HA, et al. Evaluation,   treatment, and prevention of vitamin D deficiency: an Endocrine  Society clinical practice guideline, JCEM. 2011 Jul; 96(7): 1911-30. Performed at [facility], [place] [address] [place], [place] [address]   Ferritin     Status: Abnormal   Collection Time: 05/11/19 12:55 PM  Result Value Ref Range   Ferritin 467 (H) 24 - 336 ng/mL    Comment: Performed at [facility], [address], [place], [place] [address]  Iron and TIBC     Status: None   Collection Time: 05/11/19 12:55 PM  Result Value Ref Range   Iron 87 45 - 182 ug/dL   TIBC 311 250 - 450 ug/dL   Saturation Ratios 28 17.9 - 39.5 %   UIBC 224 ug/dL    Comment: Performed at [facility], [address] [place], [place] [address]  Folate     Status: None   Collection Time: 05/11/19 12:55 PM  Result Value Ref Range   Folate 11.3 >5.9 ng/mL    Comment: Performed at [facility], [address], [place], [place] [address]  Vitamin B12     Status: None   Collection Time: 05/11/19 12:55 PM  Result Value Ref Range   Vitamin B-12 273 180 - 914 pg/mL    Comment: (NOTE) This assay is not validated for testing neonatal or myeloproliferative syndrome specimens for Vitamin B12 levels. Performed at [facility], [address], [place],  [address]     RADIOGRAPHIC STUDIES: I have personally reviewed the radiological images as listed and agreed with the findings in the report. US RENAL  Result Date: 04/22/2019 CLINICAL DATA:  Chronic kidney disease, proteinuria EXAM: RENAL / URINARY TRACT ULTRASOUND COMPLETE COMPARISON:  None. FINDINGS: Right Kidney: Renal measurements: 8.6 x 5.5 x 5.6 = volume: 140.43 mL . Echogenicity within normal limits. No mass or hydronephrosis visualized. Left Kidney: Renal measurements: 11.0 x 4.0 x 6.0 = volume: 142.76 mL. Echogenicity within normal limits. No mass or hydronephrosis visualized. Bladder: Appears normal for degree of bladder distention. Other: Incidental note made of splenomegaly.  Splenic length of 16.2 cm with an approximate volume of 551 mL. IMPRESSION: Unremarkable renal ultrasound. Splenomegaly is noted incidentally. Electronically Signed   By: Elwin Sleight.D.  On: 04/22/2019 21:18   I have independently examined this patient and elicited history.  I agree with the HPI documented by my nurse practitioner Wenda Low, FNP.  I have independently formulated my assessment and plan.  ASSESSMENT & PLAN:  Thrombocytopenia (HCC) 1.  Splenomegaly and thrombocytopenia: -Patient seen at the request of Dr. Theador Hawthorne for thrombocytopenia. -CBC on 02/28/2019 shows platelet count 134.  Hemoglobin, white count was normal. -Further work-up including hepatitis B surface antigen, hepatitis C antibody, ANA, ANCA were negative.  SPEP was negative. -No easy bruising or bleeding issues reported. -Renal ultrasound on 04/22/2019 showed incidental splenomegaly measuring 16.2 cm with volume of 551 mL. -Patient denies any liver problems.  No prior history of lymphoma.  No B symptoms. -Physical exam did not reveal any palpable lymphadenopathy. -We will repeat his platelet count and sodium citrate (blue top) tube to rule out any clumping.  We will also check for nutritional deficiencies.  We will also check an LDH level.  No further cause of splenomegaly, will consider CT imaging with contrast.  If contrast cannot be given we will consider PET scan. -We will see him back in 2 to 3 weeks to discuss results.  2.  Elevated ferritin: -Labs on 02/28/2019 shows ferritin of 977 with percent saturation of 34. -We will do hemochromatosis panel today.  3.  CKD: -He has stage IIIb CKD since 2019 thought to be secondary to diabetes. -Renal ultrasound on 04/22/2019 showed unremarkable kidneys.     All questions were answered. The patient knows to call the clinic with any problems, questions or concerns.      Derek Jack, MD 05/11/19 6:37 PM

## 2019-05-11 NOTE — Assessment & Plan Note (Signed)
1.  Splenomegaly and thrombocytopenia: -Patient seen at the request of Dr. Theador Hawthorne for thrombocytopenia. -CBC on 02/28/2019 shows platelet count 134.  Hemoglobin, white count was normal. -Further work-up including hepatitis B surface antigen, hepatitis C antibody, ANA, ANCA were negative.  SPEP was negative. -No easy bruising or bleeding issues reported. -Renal ultrasound on 04/22/2019 showed incidental splenomegaly measuring 16.2 cm with volume of 551 mL. -Patient denies any liver problems.  No prior history of lymphoma.  No B symptoms. -Physical exam did not reveal any palpable lymphadenopathy. -We will repeat his platelet count and sodium citrate (blue top) tube to rule out any clumping.  We will also check for nutritional deficiencies.  We will also check an LDH level.  No further cause of splenomegaly, will consider CT imaging with contrast.  If contrast cannot be given we will consider PET scan. -We will see him back in 2 to 3 weeks to discuss results.  2.  Elevated ferritin: -Labs on 02/28/2019 shows ferritin of 977 with percent saturation of 34. -We will do hemochromatosis panel today.  3.  CKD: -He has stage IIIb CKD since 2019 thought to be secondary to diabetes. -Renal ultrasound on 04/22/2019 showed unremarkable kidneys.

## 2019-05-12 ENCOUNTER — Telehealth: Payer: Self-pay | Admitting: *Deleted

## 2019-05-12 LAB — KAPPA/LAMBDA LIGHT CHAINS
Kappa free light chain: 35.8 mg/L — ABNORMAL HIGH (ref 3.3–19.4)
Kappa, lambda light chain ratio: 1.83 — ABNORMAL HIGH (ref 0.26–1.65)
Lambda free light chains: 19.6 mg/L (ref 5.7–26.3)

## 2019-05-12 LAB — RHEUMATOID FACTOR: Rheumatoid fact SerPl-aCnc: <10 IU/mL (ref 0.0–13.9)

## 2019-05-12 NOTE — Telephone Encounter (Signed)
Received call from [facility] call nurse called and left voicemail stating that patient had testing for peripheral arterial disease and his left side was 0.73 and his right was 0.94. She stated that they recommend he take a daily asa. Patient A1c was 9.6.

## 2019-05-12 NOTE — Telephone Encounter (Signed)
Thanks for the update

## 2019-05-13 LAB — IMMUNOFIXATION ELECTROPHORESIS
IgA: 45 mg/dL — ABNORMAL LOW (ref 61–437)
IgG (Immunoglobin G), Serum: 790 mg/dL (ref 603–1613)
IgM (Immunoglobulin M), Srm: 78 mg/dL (ref 20–172)
Total Protein ELP: 6.6 g/dL (ref 6.0–8.5)

## 2019-05-14 LAB — COPPER, SERUM: Copper: 121 ug/dL (ref 69–132)

## 2019-05-16 LAB — HEMOCHROMATOSIS DNA-PCR(C282Y,H63D)

## 2019-05-26 ENCOUNTER — Inpatient Hospital Stay (HOSPITAL_COMMUNITY): Payer: Medicare Other | Attending: Hematology | Admitting: Hematology

## 2019-05-26 ENCOUNTER — Encounter (HOSPITAL_COMMUNITY): Payer: Self-pay | Admitting: Hematology

## 2019-05-26 ENCOUNTER — Other Ambulatory Visit: Payer: Self-pay

## 2019-05-26 VITALS — BP 128/79 | HR 59 | Temp 96.9°F | Resp 18 | Wt 205.6 lb

## 2019-05-26 DIAGNOSIS — R161 Splenomegaly, not elsewhere classified: Secondary | ICD-10-CM | POA: Diagnosis not present

## 2019-05-26 DIAGNOSIS — N1832 Chronic kidney disease, stage 3b: Secondary | ICD-10-CM | POA: Diagnosis not present

## 2019-05-26 DIAGNOSIS — Z794 Long term (current) use of insulin: Secondary | ICD-10-CM | POA: Diagnosis not present

## 2019-05-26 DIAGNOSIS — E119 Type 2 diabetes mellitus without complications: Secondary | ICD-10-CM | POA: Insufficient documentation

## 2019-05-26 DIAGNOSIS — D696 Thrombocytopenia, unspecified: Secondary | ICD-10-CM | POA: Diagnosis not present

## 2019-05-26 NOTE — Patient Instructions (Signed)
[facility] at [facility] Discharge Instructions  You were seen today by Dr. Katragadda. He went over your recent lab results. He will see you back in 3 months for labs and follow up.   Thank you for choosing [facility] at [facility] to provide your oncology and hematology care.  To afford each patient quality time with our provider, please arrive at least 15 minutes before your scheduled appointment time.   If you have a lab appointment with the Cancer Center please come in thru the  Main Entrance and check in at the main information desk  You need to re-schedule your appointment should you arrive 10 or more minutes late.  We strive to give you quality time with our providers, and arriving late affects you and other patients whose appointments are after yours.  Also, if you no show three or more times for appointments you may be dismissed from the clinic at the providers discretion.     Again, thank you for choosing [facility].  Our hope is that these requests will decrease the amount of time that you wait before being seen by our physicians.       _____________________________________________________________  Should you have questions after your visit to  Cancer Center, please contact our office at [phone] between the hours of 8:00 a.m. and 4:30 p.m.  Voicemails left after 4:00 p.m. will not be returned until the following business day.  For prescription refill requests, have your pharmacy contact our office and allow 72 hours.    Cancer Center Support Programs:   > Cancer Support Group  2nd Tuesday of the month 1pm-2pm, Journey Room    

## 2019-05-26 NOTE — Assessment & Plan Note (Signed)
1.  Thrombocytopenia: -Patient seen at the request of Dr. Theador Hawthorne for thrombocytopenia. -CBC on 02/28/2019 showed platelet count 134. -Repeat CBC on a blue top tube on 05/11/2019 at our office showed platelet count 154. -Nutritional deficiency work-up including M03, folic acid, methylmalonic acid, copper levels were normal.  Serum immunofixation was normal.  Mildly increased free light chain ratio of 1.83. -We will plan to repeat his platelet count in 4 months.  2.  Splenomegaly: -This was found incidentally on renal ultrasound on 04/22/2019, measuring 16.2 cm with volume of 551 mL. -Patient does not have any fevers, night sweats or weight loss.  No prior history of lymphoproliferative disorders.  White count is normal with normal differential.  LDH was normal. -No further work-up necessary at this time.  If he develops any symptoms or his platelet count drops, will consider PET CT scan.  3.  Elevated ferritin: -Labs on 02/28/2019 shows ferritin 977 with percent saturation of 34 at Dr. Toya Smothers office. -Repeat ferritin on 05/11/2019 showed 467 with percent saturation of 24. -Hemochromatosis work-up was negative for C282Y and H63D mutations.  4.  CKD: -He has stage IIIb CKD since 2019 thought to be secondary to diabetes. -Renal ultrasound on 04/22/2019 showed unremarkable kidneys.

## 2019-05-26 NOTE — Progress Notes (Signed)
[place], [place] [address]   CLINIC:  Medical Oncology/Hematology  PCP:  Janora Norlander, DO [place] [address] [phone]   REASON FOR VISIT:  Follow-up for thrombocytopenia and splenomegaly.  CURRENT THERAPY: Observation.   INTERVAL HISTORY:  Anthony French 64 y.o. male seen for follow-up of for thrombocytopenia and splenomegaly.  Denies any fevers, night sweats or weight loss.  Appetite is 100%.  Energy levels are 75%.  No new onset pains reported.  No easy bruising or bleeding reported.  No family history of hemochromatosis.    REVIEW OF SYSTEMS:  Review of Systems  All other systems reviewed and are negative.    PAST MEDICAL/SURGICAL HISTORY:  Past Medical History:  Diagnosis Date  . Diabetes ([place])   . Diabetic neuropathy ([place])   . Hyperlipidemia    Past Surgical History:  Procedure Laterality Date  . APPENDECTOMY  1980  . CYST REMOVAL TRUNK       SOCIAL HISTORY:  Social History   Socioeconomic History  . Marital status: Single    Spouse name: Not on file  . Number of children: 3  . Years of education: Not on file  . Highest education level: Not on file  Occupational History  . Occupation: retired  Tobacco Use  . Smoking status: Never Smoker  . Smokeless tobacco: Never Used  Substance and Sexual Activity  . Alcohol use: Not Currently  . Drug use: Never  . Sexual activity: Not Currently  Other Topics Concern  . Not on file  Social History Narrative  . Not on file   Social Determinants of Health   Financial Resource Strain:   . Difficulty of Paying Living Expenses:   Food Insecurity:   . Worried About Charity fundraiser in the Last Year:   . Arboriculturist in the Last Year:   Transportation Needs:   . Film/video editor (Medical):   Marland Kitchen Lack of Transportation (Non-Medical):   Physical Activity:   . Days of Exercise per Week:   . Minutes of Exercise per Session:   Stress:    . Feeling of Stress :   Social Connections:   . Frequency of Communication with Friends and Family:   . Frequency of Social Gatherings with Friends and Family:   . Attends Religious Services:   . Active Member of Clubs or Organizations:   . Attends Archivist Meetings:   Marland Kitchen Marital Status:   Intimate Partner Violence:   . Fear of Current or Ex-Partner:   . Emotionally Abused:   Marland Kitchen Physically Abused:   . Sexually Abused:     FAMILY HISTORY:  Family History  Problem Relation Age of Onset  . Stroke Mother   . COPD Father   . Diabetes Sister   . Stroke Brother   . Diabetes Brother   . Arthritis Maternal Grandmother     CURRENT MEDICATIONS:  Outpatient Encounter Medications as of 05/26/2019  Medication Sig  . atorvastatin (LIPITOR) 40 MG tablet TAKE 1 TABLET BY MOUTH EVERY DAY  . blood glucose meter kit and supplies Dispense based on patient and insurance preference. Use up to four times daily as directed. (FOR ICD-10 E10.9, E11.9). Check blood sugar twice a day as directed.  . gabapentin (NEURONTIN) 300 MG capsule TAKE 1 CAPSULE BY MOUTH IN THE MORNING AND TAKE 3 CAPSULES BY MOUTH IN THE EVENING  . glucose blood (ACCU-CHEK GUIDE) test strip Test  BS BID Dx E11.9  . insulin NPH-regular Human (70-30) 100 UNIT/ML injection Inject 22 Units into the skin daily with breakfast. 24 units with supper.  . Insulin Pen Needle (PEN NEEDLES) 31G X 8 MM MISC 1 each by Does not apply route in the morning and at bedtime.  . metFORMIN (GLUCOPHAGE) 500 MG tablet TAKE 1 TABLET BY MOUTH TWICE A DAY  . Vitamin D, Ergocalciferol, (DRISDOL) 1.25 MG (50000 UNIT) CAPS capsule Take 50,000 Units by mouth once a week.   No facility-administered encounter medications on file as of 05/26/2019.    ALLERGIES:  Allergies  Allergen Reactions  . Other Hives    Pt states that he is allergic to an ABT but he can not remember what it is     PHYSICAL EXAM:  ECOG Performance status: 1  Vitals:    05/26/19 1605  BP: 128/79  Pulse: (!) 59  Resp: 18  Temp: (!) 96.9 F (36.1 C)  SpO2: 100%   Filed Weights   05/26/19 1605  Weight: 205 lb 9.6 oz (93.3 kg)    Physical Exam Vitals reviewed.  Constitutional:      Appearance: Normal appearance.  Cardiovascular:     Rate and Rhythm: Regular rhythm.  Pulmonary:     Effort: Pulmonary effort is normal.     Breath sounds: Normal breath sounds.  Lymphadenopathy:     Cervical: No cervical adenopathy.  Neurological:     General: No focal deficit present.     Mental Status: He is alert and oriented to person, place, and time.  Psychiatric:        Mood and Affect: Mood normal.        Behavior: Behavior normal.        Judgment: Judgment normal.    No axillary or inguinal adenopathy.  LABORATORY DATA:  I have reviewed the labs as listed.  CBC    Component Value Date/Time   WBC 4.2 05/11/2019 1254   RBC 4.50 05/11/2019 1254   HGB 13.4 05/11/2019 1254   HGB 12.2 (L) 07/21/2017 1045   HCT 38.8 (L) 05/11/2019 1254   HCT 35.0 (L) 07/21/2017 1045   PLT 154 05/11/2019 1254   PLT 321 07/21/2017 1045   MCV 86.2 05/11/2019 1254   MCV 82 07/21/2017 1045   MCH 29.8 05/11/2019 1254   MCHC 34.5 05/11/2019 1254   RDW 13.3 05/11/2019 1254   RDW 13.2 07/21/2017 1045   LYMPHSABS 1.1 05/11/2019 1254   LYMPHSABS 1.2 07/21/2017 1045   MONOABS 0.4 05/11/2019 1254   EOSABS 0.1 05/11/2019 1254   EOSABS 0.2 07/21/2017 1045   BASOSABS 0.0 05/11/2019 1254   BASOSABS 0.0 07/21/2017 1045   CMP Latest Ref Rng & Units 05/11/2019 04/14/2019 12/27/2018  Glucose 70 - 99 mg/dL 272(H) 461(HH) 332(H)  BUN 8 - 23 mg/dL 24(H) 29(H) 38(H)  Creatinine 0.61 - 1.24 mg/dL 1.71(H) 2.16(H) 1.86(H)  Sodium 135 - 145 mmol/L 141 135 139  Potassium 3.5 - 5.1 mmol/L 5.0 4.8 4.7  Chloride 98 - 111 mmol/L 106 100 102  CO2 22 - 32 mmol/L 24 18(L) 19(L)  Calcium 8.9 - 10.3 mg/dL 9.1 9.5 9.2  Total Protein 6.5 - 8.1 g/dL 6.7 - 6.5  Total Bilirubin 0.3 - 1.2 mg/dL 0.8  - 0.5  Alkaline Phos 38 - 126 U/L 72 - 85  AST 15 - 41 U/L 17 - 13  ALT 0 - 44 U/L 21 - 14       DIAGNOSTIC IMAGING:  I have independently reviewed the scans and discussed with the patient.     ASSESSMENT & PLAN:   Thrombocytopenia ([place]) 1.  Thrombocytopenia: -Patient seen at the request of Dr. Theador Hawthorne for thrombocytopenia. -CBC on 02/28/2019 showed platelet count 134. -Repeat CBC on a blue top tube on 05/11/2019 at our office showed platelet count 154. -Nutritional deficiency work-up including V94, folic acid, methylmalonic acid, copper levels were normal.  Serum immunofixation was normal.  Mildly increased free light chain ratio of 1.83. -We will plan to repeat his platelet count in 4 months.  2.  Splenomegaly: -This was found incidentally on renal ultrasound on 04/22/2019, measuring 16.2 cm with volume of 551 mL. -Patient does not have any fevers, night sweats or weight loss.  No prior history of lymphoproliferative disorders.  White count is normal with normal differential.  LDH was normal. -No further work-up necessary at this time.  If he develops any symptoms or his platelet count drops, will consider PET CT scan.  3.  Elevated ferritin: -Labs on 02/28/2019 shows ferritin 977 with percent saturation of 34 at Dr. Toya Smothers office. -Repeat ferritin on 05/11/2019 showed 467 with percent saturation of 24. -Hemochromatosis work-up was negative for C282Y and H63D mutations.  4.  CKD: -He has stage IIIb CKD since 2019 thought to be secondary to diabetes. -Renal ultrasound on 04/22/2019 showed unremarkable kidneys.      Orders placed this encounter:  Orders Placed This Encounter  Procedures  . CBC with Differential      Derek Jack, MD [place] [phone]

## 2019-06-01 ENCOUNTER — Encounter: Payer: Self-pay | Admitting: "Endocrinology

## 2019-06-01 ENCOUNTER — Ambulatory Visit: Payer: Medicare Other | Admitting: "Endocrinology

## 2019-06-01 ENCOUNTER — Encounter: Payer: Medicare Other | Attending: Family Medicine | Admitting: Nutrition

## 2019-06-01 ENCOUNTER — Other Ambulatory Visit: Payer: Self-pay

## 2019-06-01 ENCOUNTER — Encounter: Payer: Self-pay | Admitting: Nutrition

## 2019-06-01 VITALS — BP 142/89 | HR 59 | Ht 70.5 in | Wt 210.2 lb

## 2019-06-01 DIAGNOSIS — E1122 Type 2 diabetes mellitus with diabetic chronic kidney disease: Secondary | ICD-10-CM | POA: Diagnosis not present

## 2019-06-01 DIAGNOSIS — E782 Mixed hyperlipidemia: Secondary | ICD-10-CM | POA: Insufficient documentation

## 2019-06-01 DIAGNOSIS — N183 Chronic kidney disease, stage 3 unspecified: Secondary | ICD-10-CM | POA: Diagnosis not present

## 2019-06-01 DIAGNOSIS — I10 Essential (primary) hypertension: Secondary | ICD-10-CM

## 2019-06-01 DIAGNOSIS — E1121 Type 2 diabetes mellitus with diabetic nephropathy: Secondary | ICD-10-CM | POA: Insufficient documentation

## 2019-06-01 DIAGNOSIS — N1832 Chronic kidney disease, stage 3b: Secondary | ICD-10-CM | POA: Insufficient documentation

## 2019-06-01 DIAGNOSIS — Z794 Long term (current) use of insulin: Secondary | ICD-10-CM | POA: Diagnosis not present

## 2019-06-01 HISTORY — DX: Essential (primary) hypertension: I10

## 2019-06-01 MED ORDER — GLIPIZIDE ER 5 MG PO TB24: 5.0000 mg | ORAL_TABLET | Freq: Every day | ORAL | 3 refills | Status: DC

## 2019-06-01 MED ORDER — LOSARTAN POTASSIUM 50 MG PO TABS: 50.0000 mg | ORAL_TABLET | Freq: Every day | ORAL | 3 refills | Status: DC

## 2019-06-01 NOTE — Patient Instructions (Signed)
Goals Follow My Plate Eat three meals a day at times discused. Take insulin BEFORE meals instead of after Cut out diet sodas. Drink only water Walk 30 minutes daily. Test blood sugars twice a day. Get A1C down to 7%

## 2019-06-01 NOTE — Patient Instructions (Signed)

## 2019-06-01 NOTE — Progress Notes (Signed)
  Medical Nutrition Therapy:  Appt start time: 0930 end time:  1000.   Assessment:  Primary concerns today: Type 2 Dm. Walk in from Dr. Dorris Fetch. A1C 9%. Will do safety questions next visit. Here to see Dr. Dorris Fetch, Endocrinology. Taking 70/30 30 units BID, Glipizide 5 mg. Has been taking his insulin after meals instead of before. Eats 2 meals per day. Doesn't cook at home. Eats at his sister in laws 2 times per week. Eats out most of the other meals. Drinks a lot of diet sodas. Willing to make some changes with diet and medications to improve his DM. Cant do Metformin Due to CKD.  Lab Results  Component Value Date   HGBA1C 9.0 05/05/2019   CMP Latest Ref Rng & Units 05/11/2019 04/14/2019 12/27/2018  Glucose 70 - 99 mg/dL 272(H) 461(HH) 332(H)  BUN 8 - 23 mg/dL 24(H) 29(H) 38(H)  Creatinine 0.61 - 1.24 mg/dL 1.71(H) 2.16(H) 1.86(H)  Sodium 135 - 145 mmol/L 141 135 139  Potassium 3.5 - 5.1 mmol/L 5.0 4.8 4.7  Chloride 98 - 111 mmol/L 106 100 102  CO2 22 - 32 mmol/L 24 18(L) 19(L)  Calcium 8.9 - 10.3 mg/dL 9.1 9.5 9.2  Total Protein 6.5 - 8.1 g/dL 6.7 - 6.5  Total Bilirubin 0.3 - 1.2 mg/dL 0.8 - 0.5  Alkaline Phos 38 - 126 U/L 72 - 85  AST 15 - 41 U/L 17 - 13  ALT 0 - 44 U/L 21 - 14   Lipid Panel     Component Value Date/Time   CHOL 141 12/27/2018 1132   TRIG 183 (H) 12/27/2018 1132   HDL 39 (L) 12/27/2018 1132   CHOLHDL 3.6 12/27/2018 1132   LDLCALC 71 12/27/2018 1132   LABVLDL 31 12/27/2018 1132   .  Usual physical activity:ADKL   Estimated energy needs: 1800  calories 200 g carbohydrates 135 g protein 50 g fat  Progress Towards Goal(s):  In progress.   Nutritional Diagnosis:  NB-1.1 Food and nutrition-related knowledge deficit As related to Diabetes Type 2.  As evidenced by A1C 9%.    Intervention:  Nutrition and Diabetes education provided on My Plate, CHO counting, meal planning, portion sizes, timing of meals, avoiding snacks between meals unless having a low blood  sugar, target ranges for A1C and blood sugars, signs/symptoms and treatment of hyper/hypoglycemia, monitoring blood sugars, taking medications as prescribed, benefits of exercising 30 minutes per day and prevention of complications of DM.  Goals Follow My Plate Eat three meals a day at times discused. Take insulin BEFORE meals instead of after Cut out diet sodas. Drink only water Walk 30 minutes daily. Test blood sugars twice a day. Get A1C down to 7% .  Teaching Method Utilized:  Visual Auditory Hands on  Handouts given during visit include:  The Plate Method   Diabetes Instructions.   Barriers to learning/adherence to lifestyle change:   Demonstrated degree of understanding via:  Teach Back   Monitoring/Evaluation:  Dietary intake, exercise, and body weight in 1 month(s).

## 2019-06-01 NOTE — Progress Notes (Signed)
Endocrinology Consult Note       06/01/2019, 2:11 PM   Subjective:    Patient ID: Anthony French, male    DOB: 01/04/1956.  Anthony French is being seen in consultation for management of currently uncontrolled symptomatic diabetes requested by  Janora Norlander, DO.   Past Medical History:  Diagnosis Date  . Diabetes ([place])   . Diabetic neuropathy ([place])   . Essential hypertension, benign 06/01/2019  . Hyperlipidemia     Past Surgical History:  Procedure Laterality Date  . APPENDECTOMY  1980  . CYST REMOVAL TRUNK      Social History   Socioeconomic History  . Marital status: Single    Spouse name: Not on file  . Number of children: 3  . Years of education: Not on file  . Highest education level: Not on file  Occupational History  . Occupation: retired  Tobacco Use  . Smoking status: Never Smoker  . Smokeless tobacco: Never Used  Substance and Sexual Activity  . Alcohol use: Not Currently  . Drug use: Never  . Sexual activity: Not Currently  Other Topics Concern  . Not on file  Social History Narrative  . Not on file   Social Determinants of Health   Financial Resource Strain:   . Difficulty of Paying Living Expenses:   Food Insecurity:   . Worried About Charity fundraiser in the Last Year:   . Arboriculturist in the Last Year:   Transportation Needs:   . Film/video editor (Medical):   Marland Kitchen Lack of Transportation (Non-Medical):   Physical Activity:   . Days of Exercise per Week:   . Minutes of Exercise per Session:   Stress:   . Feeling of Stress :   Social Connections:   . Frequency of Communication with Friends and Family:   . Frequency of Social Gatherings with Friends and Family:   . Attends Religious Services:   . Active Member of Clubs or Organizations:   . Attends Archivist Meetings:   Marland Kitchen Marital Status:     Family History  Problem Relation Age of Onset   . Stroke Mother   . Diabetes Mother   . Hypertension Mother   . Hyperlipidemia Mother   . Kidney disease Mother   . COPD Father   . Diabetes Sister   . Stroke Brother   . Diabetes Brother   . Arthritis Maternal Grandmother     Outpatient Encounter Medications as of 06/01/2019  Medication Sig  . atorvastatin (LIPITOR) 40 MG tablet TAKE 1 TABLET BY MOUTH EVERY DAY  . blood glucose meter kit and supplies Dispense based on patient and insurance preference. Use up to four times daily as directed. (FOR ICD-10 E10.9, E11.9). Check blood sugar twice a day as directed.  . gabapentin (NEURONTIN) 300 MG capsule TAKE 1 CAPSULE BY MOUTH IN THE MORNING AND TAKE 3 CAPSULES BY MOUTH IN THE EVENING (Patient taking differently: TAKE 1 CAPSULE BY MOUTH IN THE MORNING AND TAKE 2 CAPSULES BY MOUTH IN THE EVENING)  . glipiZIDE (GLUCOTROL XL) 5 MG 24 hr tablet Take 1 tablet (  5 mg total) by mouth daily with breakfast.  . glucose blood (ACCU-CHEK GUIDE) test strip Test BS BID Dx E11.9  . insulin NPH-regular Human (70-30) 100 UNIT/ML injection Inject 30 Units into the skin 2 (two) times daily before a meal.  . Insulin Pen Needle (PEN NEEDLES) 31G X 8 MM MISC 1 each by Does not apply route in the morning and at bedtime.  Marland Kitchen losartan (COZAAR) 50 MG tablet Take 1 tablet (50 mg total) by mouth daily.  . Vitamin D, Ergocalciferol, (DRISDOL) 1.25 MG (50000 UNIT) CAPS capsule Take 50,000 Units by mouth once a week.  . [DISCONTINUED] metFORMIN (GLUCOPHAGE) 500 MG tablet TAKE 1 TABLET BY MOUTH TWICE A DAY   No facility-administered encounter medications on file as of 06/01/2019.    ALLERGIES: Allergies  Allergen Reactions  . Other Hives    Pt states that he is allergic to an ABT but he can not remember what it is    VACCINATION STATUS: Immunization History  Administered Date(s) Administered  . Influenza,inj,Quad PF,6+ Mos 12/25/2016  . Pneumococcal Conjugate-13 12/27/2018  . Pneumococcal Polysaccharide-23  04/16/2017  . Tdap 08/19/2017    Diabetes He presents for his initial diabetic visit. He has type 2 diabetes mellitus. Onset time: He was diagnosed at approximate age of 64 years. His disease course has been improving ("Early March blood work his A1c was 13.3%, however reportedly on May 05, 2019 at home test for A1c was 9%.). There are no hypoglycemic associated symptoms. Pertinent negatives for hypoglycemia include no confusion, headaches, pallor or seizures. Associated symptoms include blurred vision, polydipsia and polyuria. Pertinent negatives for diabetes include no chest pain, no fatigue, no polyphagia and no weakness. There are no hypoglycemic complications. Symptoms are worsening. Diabetic complications include nephropathy and peripheral neuropathy. Risk factors for coronary artery disease include dyslipidemia, diabetes mellitus, hypertension, male sex, sedentary lifestyle and family history. Current diabetic treatment includes insulin injections (He is currently on Novolin 70/30 24 units twice daily, Metformin 500 mg p.o. twice daily.). His weight is fluctuating minimally. He is following a generally unhealthy diet. When asked about meal planning, he reported none. He has not had a previous visit with a dietitian. He never participates in exercise. His home blood glucose trend is decreasing steadily. (Patient did not bring any logs nor meter to review.  He verbally reports that he is blood glucose ranges from 200-240 mg per DL at random tests.  His recent A1c was reported to be 9%, improving from 13.3%.) An ACE inhibitor/angiotensin II receptor blocker is not being taken. Eye exam is current.  Hyperlipidemia This is a chronic problem. The current episode started more than 1 year ago. The problem is uncontrolled. Exacerbating diseases include chronic renal disease and diabetes. Pertinent negatives include no chest pain, myalgias or shortness of breath. Current antihyperlipidemic treatment includes  statins. Risk factors for coronary artery disease include diabetes mellitus, dyslipidemia, hypertension, male sex and a sedentary lifestyle.  Hypertension This is a chronic problem. The problem is uncontrolled. Associated symptoms include blurred vision. Pertinent negatives include no chest pain, headaches, neck pain, palpitations or shortness of breath. Risk factors for coronary artery disease include dyslipidemia, diabetes mellitus, family history, male gender and sedentary lifestyle. Past treatments include nothing. Hypertensive end-organ damage includes kidney disease. Identifiable causes of hypertension include chronic renal disease.     Review of Systems  Constitutional: Negative for chills, fatigue, fever and unexpected weight change.  HENT: Negative for dental problem, mouth sores and trouble swallowing.  Eyes: Positive for blurred vision. Negative for visual disturbance.  Respiratory: Negative for cough, choking, chest tightness, shortness of breath and wheezing.   Cardiovascular: Negative for chest pain, palpitations and leg swelling.  Gastrointestinal: Negative for abdominal distention, abdominal pain, constipation, diarrhea, nausea and vomiting.  Endocrine: Positive for polydipsia and polyuria. Negative for polyphagia.  Genitourinary: Negative for dysuria, flank pain, hematuria and urgency.  Musculoskeletal: Negative for back pain, gait problem, myalgias and neck pain.  Skin: Negative for pallor, rash and wound.  Neurological: Negative for seizures, syncope, weakness, numbness and headaches.  Psychiatric/Behavioral: Negative for confusion and dysphoric mood.    Objective:    Vitals with BMI 06/01/2019 05/26/2019 05/11/2019  Height 5' 10.5" - 5' 10.5"  Weight 210 lbs 3 oz 205 lbs 10 oz 206 lbs  BMI 29.72 29.79 89.21  Systolic 194 174 081  Diastolic 89 79 74  Pulse 59 59 59    BP (!) 142/89   Pulse (!) 59   Ht 5' 10.5" (1.791 m)   Wt 210 lb 3.2 oz (95.3 kg)   BMI 29.73  kg/m   Wt Readings from Last 3 Encounters:  06/01/19 210 lb 3.2 oz (95.3 kg)  05/26/19 205 lb 9.6 oz (93.3 kg)  05/11/19 206 lb (93.4 kg)     Physical Exam Constitutional:      General: He is not in acute distress.    Appearance: He is well-developed.  HENT:     Head: Normocephalic and atraumatic.  Neck:     Thyroid: No thyromegaly.     Trachea: No tracheal deviation.  Cardiovascular:     Rate and Rhythm: Normal rate.     Pulses:          Dorsalis pedis pulses are 1+ on the right side and 1+ on the left side.       Posterior tibial pulses are 1+ on the right side and 1+ on the left side.     Heart sounds: S1 normal and S2 normal. No murmur. No gallop.   Pulmonary:     Effort: Pulmonary effort is normal. No respiratory distress.     Breath sounds: No wheezing.  Abdominal:     General: There is no distension.     Tenderness: There is no abdominal tenderness. There is no guarding.  Musculoskeletal:     Right shoulder: No swelling or deformity.     Cervical back: Normal range of motion and neck supple.  Skin:    General: Skin is warm and dry.     Findings: No rash.     Nails: There is no clubbing.  Neurological:     Mental Status: He is alert and oriented to person, place, and time.     Cranial Nerves: No cranial nerve deficit.     Sensory: No sensory deficit.     Gait: Gait normal.     Deep Tendon Reflexes: Reflexes are normal and symmetric.  Psychiatric:        Speech: Speech normal.        Behavior: Behavior normal. Behavior is cooperative.        Thought Content: Thought content normal.        Judgment: Judgment normal.     CMP ( most recent) CMP     Component Value Date/Time   NA 141 05/11/2019 1254   NA 135 04/14/2019 1128   K 5.0 05/11/2019 1254   CL 106 05/11/2019 1254   CO2 24 05/11/2019 1254   GLUCOSE 272 (  H) 05/11/2019 1254   BUN 24 (H) 05/11/2019 1254   BUN 29 (H) 04/14/2019 1128   CREATININE 1.71 (H) 05/11/2019 1254   CALCIUM 9.1 05/11/2019 1254    PROT 6.7 05/11/2019 1254   PROT 6.5 12/27/2018 1132   ALBUMIN 4.4 05/11/2019 1254   ALBUMIN 4.6 12/27/2018 1132   AST 17 05/11/2019 1254   ALT 21 05/11/2019 1254   ALKPHOS 72 05/11/2019 1254   BILITOT 0.8 05/11/2019 1254   BILITOT 0.5 12/27/2018 1132   GFRNONAA 41 (L) 05/11/2019 1254   GFRAA 48 (L) 05/11/2019 1254    Diabetic Labs (most recent): Lab Results  Component Value Date   HGBA1C 9.0 05/05/2019   HGBA1C 13.3 (H) 04/14/2019   HGBA1C 8.6 (H) 12/27/2018     Lipid Panel ( most recent) Lipid Panel     Component Value Date/Time   CHOL 141 12/27/2018 1132   TRIG 183 (H) 12/27/2018 1132   HDL 39 (L) 12/27/2018 1132   CHOLHDL 3.6 12/27/2018 1132   LDLCALC 71 12/27/2018 1132   LABVLDL 31 12/27/2018 1132     Assessment & Plan:   1. Type 2 diabetes mellitus with stage 3b chronic kidney disease, with long-term current use of insulin ([facility]CC)   - Anthony French has currently uncontrolled symptomatic type 2 DM since  64 years of age,  with most recent A1c of 9 %. Recent labs reviewed.  Patient did not bring any logs nor meter to review.  He verbally reports that he is blood glucose ranges from 200-240 mg per DL at random tests.  His recent A1c was reported to be 9%, improving from 13.3%.  - I had a long discussion with him about the progressive nature of diabetes and the pathology behind its complications. -his diabetes is complicated by CKD, peripheral neuropathy and he remains at a high risk for more acute and chronic complications which include CAD, CVA, CKD, retinopathy, and neuropathy. These are all discussed in detail with him.  - I have counseled him on diet  and weight management  by adopting a carbohydrate restricted/protein rich diet. Patient is encouraged to switch to  unprocessed or minimally processed     complex starch and increased protein intake (animal or plant source), fruits, and vegetables. -  he is advised to stick to a routine mealtimes to eat 3 meals  a day  and avoid unnecessary snacks ( to snack only to correct hypoglycemia).   - he admits that there is a room for improvement in his food and drink choices. - Suggestion is made for him to avoid simple carbohydrates  from his diet including Cakes, Sweet Desserts, Ice Cream, Soda (diet and regular), Sweet Tea, Candies, Chips, Cookies, Store Bought Juices, Alcohol in Excess of  1-2 drinks a day, Artificial Sweeteners,  Coffee Creamer, and "Sugar-free" Products. This will help patient to have more stable blood glucose profile and potentially avoid unintended weight gain.  - he will be scheduled with Jearld Fenton, RDN, CDE for diabetes education.  - I have approached him with the following individualized plan to manage  his diabetes and patient agrees:   - he will continue to benefit from simplicity of premixed insulin.  He is advised to increase his Novolin 70/30 to 30 units with breakfast and supper, only when premeal blood glucose readings are above 90 mg per DL.   -He is approached to start monitoring blood glucose 4 times a day-daily before meals and at bedtime and return in 2 weeks  for reevaluation.    - he is warned not to take insulin without proper monitoring per orders. - Adjustment parameters are given to him for hypo and hyperglycemia in writing. - he is encouraged to call clinic for blood glucose levels less than 70 or above 300 mg /dl. -He is advised to hold off Metformin for now due to renal insufficiency.  He may benefit from low-dose glipizide.  I discussed and added glipizide 5 mg XL p.o. daily at breakfast.  - Specific targets for  A1c;  LDL, HDL,  and Triglycerides were discussed with the patient.  2) Blood Pressure /Hypertension:  his blood pressure is un controlled to target. He is not on any blood pressure intervention.  I discussed and initiated losartan 50 mg p.o. daily at breakfast for him.   3) Lipids/Hyperlipidemia:   Review of his recent lipid panel showed uncontrolled  triglycerides at 183, controlled LDL at 71.  He is advised to continue atorvastatin 40  mg daily at bedtime.  Side effects and precautions discussed with him.  4)  Weight/Diet:  Body mass index is 29.73 kg/m.     he is  a candidate for weight loss. I discussed with him the fact that loss of 5 - 10% of his  current body weight will have the most impact on his diabetes management.  Exercise, and detailed carbohydrates information provided  -  detailed on discharge instructions.  5) Chronic Care/Health Maintenance:  -he  is on  Statin medications and  is encouraged to initiate and continue to follow up with Ophthalmology, Dentist,  Podiatrist at least yearly or according to recommendations, and advised to   stay away from smoking. I have recommended yearly flu vaccine and pneumonia vaccine at least every 5 years; moderate intensity exercise for up to 150 minutes weekly; and  sleep for at least 7 hours a day.  - he is  advised to maintain close follow up with Janora Norlander, DO for primary care needs, as well as his other providers for optimal and coordinated care.   - Time spent in this patient care: 60 min, of which > 50% was spent in  counseling  him about his currently uncontrolled, complicated type 2 diabetes; hyperlipidemia; hypertension and the rest reviewing his blood glucose logs , discussing his hypoglycemia and hyperglycemia episodes, reviewing his current and  previous labs / studies  ( including abstraction from other facilities) and medications  doses and developing a  long term treatment plan based on the latest standards of care/ guidelines; and documenting his care.    Please refer to Patient Instructions for Blood Glucose Monitoring and Insulin/Medications Dosing Guide"  in media tab for additional information. Please  also refer to " Patient Self Inventory" in the Media  tab for reviewed elements of pertinent patient history.  Anthony French participated in the discussions, expressed  understanding, and voiced agreement with the above plans.  All questions were answered to his satisfaction. he is encouraged to contact clinic should he have any questions or concerns prior to his return visit.   Follow up plan: - Return in about 2 weeks (around 06/15/2019) for Follow up with Meter and Logs Only - no Labs.  Glade Lloyd, MD [facility] [address] [place], [place] [address] Phone: [phone]  Fax: [phone]    06/01/2019, 2:11 PM  This note was partially dictated with voice recognition software. Similar sounding words can be transcribed inadequately or may not  be corrected upon review.

## 2019-06-14 ENCOUNTER — Other Ambulatory Visit: Payer: Self-pay | Admitting: "Endocrinology

## 2019-06-14 DIAGNOSIS — Z794 Long term (current) use of insulin: Secondary | ICD-10-CM

## 2019-06-15 ENCOUNTER — Other Ambulatory Visit: Payer: Self-pay

## 2019-06-15 DIAGNOSIS — E1122 Type 2 diabetes mellitus with diabetic chronic kidney disease: Secondary | ICD-10-CM

## 2019-06-16 ENCOUNTER — Telehealth: Payer: Self-pay | Admitting: Family Medicine

## 2019-06-17 ENCOUNTER — Ambulatory Visit: Payer: Medicare Other | Admitting: "Endocrinology

## 2019-06-17 ENCOUNTER — Other Ambulatory Visit: Payer: Self-pay

## 2019-06-17 ENCOUNTER — Encounter: Payer: Self-pay | Admitting: "Endocrinology

## 2019-06-17 VITALS — BP 139/86 | HR 60 | Ht 70.5 in | Wt 213.0 lb

## 2019-06-17 DIAGNOSIS — E782 Mixed hyperlipidemia: Secondary | ICD-10-CM | POA: Diagnosis not present

## 2019-06-17 DIAGNOSIS — Z794 Long term (current) use of insulin: Secondary | ICD-10-CM | POA: Diagnosis not present

## 2019-06-17 DIAGNOSIS — E1121 Type 2 diabetes mellitus with diabetic nephropathy: Secondary | ICD-10-CM

## 2019-06-17 DIAGNOSIS — I10 Essential (primary) hypertension: Secondary | ICD-10-CM | POA: Diagnosis not present

## 2019-06-17 DIAGNOSIS — N1832 Chronic kidney disease, stage 3b: Secondary | ICD-10-CM | POA: Diagnosis not present

## 2019-06-17 NOTE — Progress Notes (Signed)
06/17/2019, 1:01 PM  Endocrinology follow-up note   Subjective:    Patient ID: Anthony French, male    DOB: [birth year].  Anthony French is being seen in follow-up after he was seen in consultation for management of currently uncontrolled symptomatic diabetes requested by  Janora Norlander, DO.   Past Medical History:  Diagnosis Date  . Diabetes ([place])   . Diabetic neuropathy ([place])   . Essential hypertension, benign 06/01/2019  . Hyperlipidemia     Past Surgical History:  Procedure Laterality Date  . APPENDECTOMY  1980  . CYST REMOVAL TRUNK      Social History   Socioeconomic History  . Marital status: Single    Spouse name: Not on file  . Number of children: 3  . Years of education: Not on file  . Highest education level: Not on file  Occupational History  . Occupation: retired  Tobacco Use  . Smoking status: Never Smoker  . Smokeless tobacco: Never Used  Substance and Sexual Activity  . Alcohol use: Not Currently  . Drug use: Never  . Sexual activity: Not Currently  Other Topics Concern  . Not on file  Social History Narrative  . Not on file   Social Determinants of Health   Financial Resource Strain:   . Difficulty of Paying Living Expenses:   Food Insecurity:   . Worried About Charity fundraiser in the Last Year:   . Arboriculturist in the Last Year:   Transportation Needs:   . Film/video editor (Medical):   Marland Kitchen Lack of Transportation (Non-Medical):   Physical Activity:   . Days of Exercise per Week:   . Minutes of Exercise per Session:   Stress:   . Feeling of Stress :   Social Connections:   . Frequency of Communication with Friends and Family:   . Frequency of Social Gatherings with Friends and Family:   . Attends Religious Services:   . Active Member of Clubs or Organizations:   . Attends Archivist Meetings:   Marland Kitchen Marital Status:     Family History  Problem Relation Age of Onset  . Stroke Mother   .  Diabetes Mother   . Hypertension Mother   . Hyperlipidemia Mother   . Kidney disease Mother   . COPD Father   . Diabetes Sister   . Stroke Brother   . Diabetes Brother   . Arthritis Maternal Grandmother     Outpatient Encounter Medications as of 06/17/2019  Medication Sig  . atorvastatin (LIPITOR) 40 MG tablet TAKE 1 TABLET BY MOUTH EVERY DAY  . blood glucose meter kit and supplies Dispense based on patient and insurance preference. Use up to four times daily as directed. (FOR ICD-10 E10.9, E11.9). Check blood sugar twice a day as directed.  . gabapentin (NEURONTIN) 300 MG capsule TAKE 1 CAPSULE BY MOUTH IN THE MORNING AND TAKE 3 CAPSULES BY MOUTH IN THE EVENING (Patient taking differently: TAKE 1 CAPSULE BY MOUTH IN THE MORNING AND TAKE 2 CAPSULES BY MOUTH IN THE EVENING)  . glipiZIDE (GLUCOTROL XL) 5 MG 24 hr tablet Take 1 tablet (5 mg total) by mouth daily with breakfast.  . glucose blood (ACCU-CHEK GUIDE) test strip Test BS BID Dx E11.9  . insulin NPH-regular Human (70-30) 100 UNIT/ML injection Inject 30-35 Units into the skin 2 (two) times daily before a meal.  . Insulin Pen Needle (PEN NEEDLES) 31G X 8 MM  MISC 1 each by Does not apply route in the morning and at bedtime.  Marland Kitchen losartan (COZAAR) 50 MG tablet Take 1 tablet (50 mg total) by mouth daily.  . Vitamin D, Ergocalciferol, (DRISDOL) 1.25 MG (50000 UNIT) CAPS capsule Take 50,000 Units by mouth once a week.   No facility-administered encounter medications on file as of 06/17/2019.    ALLERGIES: Allergies  Allergen Reactions  . Other Hives    Pt states that he is allergic to an ABT but he can not remember what it is    VACCINATION STATUS: Immunization History  Administered Date(s) Administered  . Influenza,inj,Quad PF,6+ Mos 12/25/2016  . Pneumococcal Conjugate-13 12/27/2018  . Pneumococcal Polysaccharide-23 04/16/2017  . Tdap 08/19/2017    Diabetes He presents for his follow-up diabetic visit. He has type 2 diabetes  mellitus. Onset time: He was diagnosed at approximate age of 64 years. His disease course has been improving ("Early March blood work his A1c was 13.3%, however reportedly on May 05, 2019 at home test for A1c was 9%.). There are no hypoglycemic associated symptoms. Pertinent negatives for hypoglycemia include no confusion, headaches, pallor or seizures. Associated symptoms include blurred vision, polydipsia and polyuria. Pertinent negatives for diabetes include no chest pain, no fatigue, no polyphagia and no weakness. There are no hypoglycemic complications. Symptoms are improving. Diabetic complications include nephropathy and peripheral neuropathy. Risk factors for coronary artery disease include dyslipidemia, diabetes mellitus, hypertension, male sex, sedentary lifestyle and family history. Current diabetic treatment includes insulin injections (He is currently on Novolin 70/30 30 units twice daily, Metformin 500 mg p.o. twice daily.). His weight is increasing steadily. He is following a generally unhealthy diet. When asked about meal planning, he reported none. He has not had a previous visit with a dietitian. He never participates in exercise. His home blood glucose trend is decreasing steadily. His breakfast blood glucose range is generally 180-200 mg/dl. His lunch blood glucose range is generally 140-180 mg/dl. His dinner blood glucose range is generally 180-200 mg/dl. His bedtime blood glucose range is generally 180-200 mg/dl. His overall blood glucose range is 180-200 mg/dl. (He presents with a log and meter showing improved glycemic profile.  His average 70 blood glucose readings are 198, improving from 224 over the last 30 days.  He did not have any significant hypoglycemia.    His recent A1c was reported to be 9%, improving from 13.3%.) An ACE inhibitor/angiotensin II receptor blocker is not being taken. Eye exam is current.  Hyperlipidemia This is a chronic problem. The current episode started more  than 1 year ago. The problem is uncontrolled. Exacerbating diseases include chronic renal disease and diabetes. Pertinent negatives include no chest pain, myalgias or shortness of breath. Current antihyperlipidemic treatment includes statins. Risk factors for coronary artery disease include diabetes mellitus, dyslipidemia, hypertension, male sex and a sedentary lifestyle.  Hypertension This is a chronic problem. The problem is uncontrolled. Associated symptoms include blurred vision. Pertinent negatives include no chest pain, headaches, neck pain, palpitations or shortness of breath. Risk factors for coronary artery disease include dyslipidemia, diabetes mellitus, family history, male gender and sedentary lifestyle. Past treatments include nothing. Hypertensive end-organ damage includes kidney disease. Identifiable causes of hypertension include chronic renal disease.     Review of Systems  Constitutional: Negative for chills, fatigue, fever and unexpected weight change.  HENT: Negative for dental problem, mouth sores and trouble swallowing.   Eyes: Positive for blurred vision. Negative for visual disturbance.  Respiratory: Negative for cough, choking, chest  tightness, shortness of breath and wheezing.   Cardiovascular: Negative for chest pain, palpitations and leg swelling.  Gastrointestinal: Negative for abdominal distention, abdominal pain, constipation, diarrhea, nausea and vomiting.  Endocrine: Positive for polydipsia and polyuria. Negative for polyphagia.  Genitourinary: Negative for dysuria, flank pain, hematuria and urgency.  Musculoskeletal: Negative for back pain, gait problem, myalgias and neck pain.  Skin: Negative for pallor, rash and wound.  Neurological: Negative for seizures, syncope, weakness, numbness and headaches.  Psychiatric/Behavioral: Negative for confusion and dysphoric mood.    Objective:    Vitals with BMI 06/17/2019 06/01/2019 05/26/2019  Height 5' 10.5" 5' 10.5" -   Weight 213 lbs 210 lbs 3 oz 205 lbs 10 oz  BMI 30.12 75.30 05.11  Systolic 021 117 356  Diastolic 86 89 79  Pulse 60 59 59    BP 139/86 (BP Location: Left Arm, Patient Position: Sitting)   Pulse 60   Ht 5' 10.5" (1.791 m)   Wt 213 lb (96.6 kg)   BMI 30.13 kg/m   Wt Readings from Last 3 Encounters:  06/17/19 213 lb (96.6 kg)  06/01/19 210 lb 3.2 oz (95.3 kg)  05/26/19 205 lb 9.6 oz (93.3 kg)     Physical Exam Constitutional:      General: He is not in acute distress.    Appearance: He is well-developed.  HENT:     Head: Normocephalic and atraumatic.  Neck:     Thyroid: No thyromegaly.     Trachea: No tracheal deviation.  Cardiovascular:     Rate and Rhythm: Normal rate.     Pulses:          Dorsalis pedis pulses are 1+ on the right side and 1+ on the left side.       Posterior tibial pulses are 1+ on the right side and 1+ on the left side.     Heart sounds: S1 normal and S2 normal. No murmur. No gallop.   Pulmonary:     Effort: Pulmonary effort is normal. No respiratory distress.     Breath sounds: No wheezing.  Abdominal:     General: There is no distension.     Tenderness: There is no abdominal tenderness. There is no guarding.  Musculoskeletal:     Right shoulder: No swelling or deformity.     Cervical back: Normal range of motion and neck supple.  Skin:    General: Skin is warm and dry.     Findings: No rash.     Nails: There is no clubbing.  Neurological:     Mental Status: He is alert and oriented to person, place, and time.     Cranial Nerves: No cranial nerve deficit.     Sensory: No sensory deficit.     Gait: Gait normal.     Deep Tendon Reflexes: Reflexes are normal and symmetric.  Psychiatric:        Speech: Speech normal.        Behavior: Behavior normal. Behavior is cooperative.        Thought Content: Thought content normal.        Judgment: Judgment normal.     CMP ( most recent) CMP     Component Value Date/Time   NA 141 05/11/2019  1254   NA 135 04/14/2019 1128   K 5.0 05/11/2019 1254   CL 106 05/11/2019 1254   CO2 24 05/11/2019 1254   GLUCOSE 272 (H) 05/11/2019 1254   BUN 24 (H) 05/11/2019 1254  BUN 29 (H) 04/14/2019 1128   CREATININE 1.71 (H) 05/11/2019 1254   CALCIUM 9.1 05/11/2019 1254   PROT 6.7 05/11/2019 1254   PROT 6.5 12/27/2018 1132   ALBUMIN 4.4 05/11/2019 1254   ALBUMIN 4.6 12/27/2018 1132   AST 17 05/11/2019 1254   ALT 21 05/11/2019 1254   ALKPHOS 72 05/11/2019 1254   BILITOT 0.8 05/11/2019 1254   BILITOT 0.5 12/27/2018 1132   GFRNONAA 41 (L) 05/11/2019 1254   GFRAA 48 (L) 05/11/2019 1254    Diabetic Labs (most recent): Lab Results  Component Value Date   HGBA1C 9.0 05/05/2019   HGBA1C 13.3 (H) 04/14/2019   HGBA1C 8.6 (H) 12/27/2018     Lipid Panel ( most recent) Lipid Panel     Component Value Date/Time   CHOL 141 12/27/2018 1132   TRIG 183 (H) 12/27/2018 1132   HDL 39 (L) 12/27/2018 1132   CHOLHDL 3.6 12/27/2018 1132   LDLCALC 71 12/27/2018 1132   LABVLDL 31 12/27/2018 1132     Assessment & Plan:   1. Type 2 diabetes mellitus with stage 3b chronic kidney disease, with long-term current use of insulin (HCC)   - Anthony French has currently uncontrolled symptomatic type 2 DM since  64 years of age.    Recent labs reviewed.  He presents with a log and meter showing improved glycemic profile.  His average 70 blood glucose readings are 198, improving from 224 over the last 30 days.  He did not have any significant hypoglycemia.    His recent A1c was reported to be 9%, improving from 13.3%.   - I had a long discussion with him about the progressive nature of diabetes and the pathology behind its complications. -his diabetes is complicated by CKD, peripheral neuropathy and he remains at a high risk for more acute and chronic complications which include CAD, CVA, CKD, retinopathy, and neuropathy. These are all discussed in detail with him.  - I have counseled him on diet  and  weight management  by adopting a carbohydrate restricted/protein rich diet. Patient is encouraged to switch to  unprocessed or minimally processed     complex starch and increased protein intake (animal or plant source), fruits, and vegetables. -  he is advised to stick to a routine mealtimes to eat 3 meals  a day and avoid unnecessary snacks ( to snack only to correct hypoglycemia).   - he  admits there is a room for improvement in his diet and drink choices. -  Suggestion is made for him to avoid simple carbohydrates  from his diet including Cakes, Sweet Desserts / Pastries, Ice Cream, Soda (diet and regular), Sweet Tea, Candies, Chips, Cookies, Sweet Pastries,  Store Bought Juices, Alcohol in Excess of  1-2 drinks a day, Artificial Sweeteners, Coffee Creamer, and "Sugar-free" Products. This will help patient to have stable blood glucose profile and potentially avoid unintended weight gain.   - he will be scheduled with Jearld Fenton, RDN, CDE for diabetes education.  - I have approached him with the following individualized plan to manage  his diabetes and patient agrees:   - he will continue to benefit from simplicity of premixed insulin.  He is advised to increase his Novolin 70/30 to 30 units with breakfast and 35 units before supper, only when premeal blood glucose readings are above 90 mg per DL.   -He is approached to start monitoring blood glucose 4 times a day-daily before meals and at bedtime and return in 2  weeks for reevaluation.    - he is warned not to take insulin without proper monitoring per orders. - Adjustment parameters are given to him for hypo and hyperglycemia in writing. - he is encouraged to call clinic for blood glucose levels less than 70 or above 300 mg /dl. -He is advised to stay off of metformin for now due to renal insufficiency.  He has benefited from low-dose glipizide.  He is advised to continue glipizide 5 mg XL p.o. daily at breakfast.    - Specific targets  for  A1c;  LDL, HDL,  and Triglycerides were discussed with the patient.  2) Blood Pressure /Hypertension:  His blood pressure is controlled to target. He is not on any blood pressure intervention.  I discussed and initiated losartan 50 mg p.o. daily at breakfast for him.   3) Lipids/Hyperlipidemia:   Review of his recent lipid panel showed uncontrolled triglycerides at 183, controlled LDL at 71.  He is advised to continue atorvastatin 40 mg daily at bedtime.  Side effects and precautions discussed with him.  4)  Weight/Diet:  Body mass index is 30.13 kg/m.     he is  a candidate for weight loss. I discussed with him the fact that loss of 5 - 10% of his  current body weight will have the most impact on his diabetes management.  Exercise, and detailed carbohydrates information provided  -  detailed on discharge instructions.  5) Chronic Care/Health Maintenance:  -he  is on  Statin medications and  is encouraged to initiate and continue to follow up with Ophthalmology, Dentist,  Podiatrist at least yearly or according to recommendations, and advised to   stay away from smoking. I have recommended yearly flu vaccine and pneumonia vaccine at least every 5 years; moderate intensity exercise for up to 150 minutes weekly; and  sleep for at least 7 hours a day.  - he is  advised to maintain close follow up with Janora Norlander, DO for primary care needs, as well as his other providers for optimal and coordinated care.   - Time spent on this patient care encounter:  35 min, of which > 50% was spent in  counseling and the rest reviewing his blood glucose logs , discussing his hypoglycemia and hyperglycemia episodes, reviewing his current and  previous labs / studies  ( including abstraction from other facilities) and medications  doses and developing a  long term treatment plan and documenting his care.   Please refer to Patient Instructions for Blood Glucose Monitoring and Insulin/Medications Dosing  Guide"  in media tab for additional information. Please  also refer to " Patient Self Inventory" in the Media  tab for reviewed elements of pertinent patient history.  [name] participated in the discussions, expressed understanding, and voiced agreement with the above plans.  All questions were answered to his satisfaction. he is encouraged to contact clinic should he have any questions or concerns prior to his return visit.   Follow up plan: - Return in about 3 months (around 09/17/2019) for Bring Meter and Logs- A1c in Office, Follow up with Pre-visit Labs.  Glade Lloyd, MD [facility] [address] [place], [place] [address] Phone: [phone]  Fax: [phone]    06/17/2019, 1:01 PM  This note was partially dictated with voice recognition software. Similar sounding words can be transcribed inadequately or may not  be corrected upon review.

## 2019-06-17 NOTE — Patient Instructions (Signed)

## 2019-06-22 ENCOUNTER — Ambulatory Visit: Payer: Medicare Other | Admitting: Nutrition

## 2019-06-27 ENCOUNTER — Telehealth: Payer: Self-pay | Admitting: Family Medicine

## 2019-06-28 NOTE — Telephone Encounter (Signed)
Patient aware and verbalizes understanding. 

## 2019-06-28 NOTE — Telephone Encounter (Signed)
lmtcb

## 2019-06-28 NOTE — Telephone Encounter (Signed)
Glad to do orders if he can bring the orders to office or have his kidney doctor send me what he wants collected

## 2019-07-06 ENCOUNTER — Other Ambulatory Visit: Payer: Medicare Other

## 2019-07-06 ENCOUNTER — Other Ambulatory Visit: Payer: Self-pay

## 2019-07-06 DIAGNOSIS — E1129 Type 2 diabetes mellitus with other diabetic kidney complication: Secondary | ICD-10-CM | POA: Diagnosis not present

## 2019-07-06 DIAGNOSIS — I129 Hypertensive chronic kidney disease with stage 1 through stage 4 chronic kidney disease, or unspecified chronic kidney disease: Secondary | ICD-10-CM | POA: Diagnosis not present

## 2019-07-06 DIAGNOSIS — Z79899 Other long term (current) drug therapy: Secondary | ICD-10-CM | POA: Diagnosis not present

## 2019-07-06 DIAGNOSIS — E1122 Type 2 diabetes mellitus with diabetic chronic kidney disease: Secondary | ICD-10-CM | POA: Diagnosis not present

## 2019-07-06 DIAGNOSIS — N189 Chronic kidney disease, unspecified: Secondary | ICD-10-CM | POA: Diagnosis not present

## 2019-07-06 DIAGNOSIS — R809 Proteinuria, unspecified: Secondary | ICD-10-CM | POA: Diagnosis not present

## 2019-07-13 ENCOUNTER — Encounter: Payer: Self-pay | Admitting: Nutrition

## 2019-07-13 ENCOUNTER — Other Ambulatory Visit: Payer: Self-pay

## 2019-07-13 ENCOUNTER — Encounter: Payer: Medicare Other | Attending: Family Medicine | Admitting: Nutrition

## 2019-07-13 VITALS — Ht 69.0 in | Wt 208.0 lb

## 2019-07-13 DIAGNOSIS — E1122 Type 2 diabetes mellitus with diabetic chronic kidney disease: Secondary | ICD-10-CM | POA: Insufficient documentation

## 2019-07-13 DIAGNOSIS — Z794 Long term (current) use of insulin: Secondary | ICD-10-CM

## 2019-07-13 DIAGNOSIS — N1832 Chronic kidney disease, stage 3b: Secondary | ICD-10-CM | POA: Diagnosis not present

## 2019-07-13 DIAGNOSIS — E1121 Type 2 diabetes mellitus with diabetic nephropathy: Secondary | ICD-10-CM | POA: Insufficient documentation

## 2019-07-13 DIAGNOSIS — N183 Chronic kidney disease, stage 3 unspecified: Secondary | ICD-10-CM | POA: Diagnosis not present

## 2019-07-13 DIAGNOSIS — E782 Mixed hyperlipidemia: Secondary | ICD-10-CM | POA: Diagnosis not present

## 2019-07-13 DIAGNOSIS — I10 Essential (primary) hypertension: Secondary | ICD-10-CM

## 2019-07-13 NOTE — Patient Instructions (Signed)
   Goals  Increase vegetables with lunch and supper Cut out diet sodas and only drink water Increase walking in yard and around yard. Get A1C down to 8% or less. Cut snacks after supper.

## 2019-07-13 NOTE — Progress Notes (Signed)
  Medical Nutrition Therapy:  Appt start time: 0930 end time:  1000.   Assessment:  Primary concerns today: Type 2 Dm.   7 days 159 BS's  172- 184 mg/dl. 70 /30  30 in am and 35 at night. Eating three meals per day. Some walking Trying to cut out diet sodas and drinking more water Trying to eat more fresh fruits and vegetables at time. Going to see CKD MD tomorrow.    Lab Results  Component Value Date   HGBA1C 9.0 05/05/2019   CMP Latest Ref Rng & Units 05/11/2019 04/14/2019 12/27/2018  Glucose 70 - 99 mg/dL 272(H) 461(HH) 332(H)  BUN 8 - 23 mg/dL 24(H) 29(H) 38(H)  Creatinine 0.61 - 1.24 mg/dL 1.71(H) 2.16(H) 1.86(H)  Sodium 135 - 145 mmol/L 141 135 139  Potassium 3.5 - 5.1 mmol/L 5.0 4.8 4.7  Chloride 98 - 111 mmol/L 106 100 102  CO2 22 - 32 mmol/L 24 18(L) 19(L)  Calcium 8.9 - 10.3 mg/dL 9.1 9.5 9.2  Total Protein 6.5 - 8.1 g/dL 6.7 - 6.5  Total Bilirubin 0.3 - 1.2 mg/dL 0.8 - 0.5  Alkaline Phos 38 - 126 U/L 72 - 85  AST 15 - 41 U/L 17 - 13  ALT 0 - 44 U/L 21 - 14   Lipid Panel     Component Value Date/Time   CHOL 141 12/27/2018 1132   TRIG 183 (H) 12/27/2018 1132   HDL 39 (L) 12/27/2018 1132   CHOLHDL 3.6 12/27/2018 1132   LDLCALC 71 12/27/2018 1132   LABVLDL 31 12/27/2018 1132   .  Usual physical activity:ADKL   B) egg  Susage, omeltet and peaches,  L) Roast beef, pinto beans, cabbage and potatoes, water Dinner: PB sandwich, water   Estimated energy needs: 1800  calories 200 g carbohydrates 135 g protein 50 g fat  Progress Towards Goal(s):  In progress.   Nutritional Diagnosis:  NB-1.1 Food and nutrition-related knowledge deficit As related to Diabetes Type 2.  As evidenced by A1C 9%.    Intervention:  Nutrition and Diabetes education provided on My Plate, CHO counting, meal planning, portion sizes, timing of meals, avoiding snacks between meals unless having a low blood sugar, target ranges for A1C and blood sugars, signs/symptoms and treatment of  hyper/hypoglycemia, monitoring blood sugars, taking medications as prescribed, benefits of exercising 30 minutes per day and prevention of complications of DM.  Marland Kitchen Goals  Increase vegetables with lunch and supper Cut out diet sodas and only drink water Increase walking in yard and around yard. Get A1C down to 8% or less. Cut snacks after supper.   Teaching Method Utilized:  Visual Auditory Hands on  Handouts given during visit include:  The Plate Method   Diabetes Instructions.   Barriers to learning/adherence to lifestyle change:   Demonstrated degree of understanding via:  Teach Back   Monitoring/Evaluation:  Dietary intake, exercise, and body weight in 3 month(s).

## 2019-07-14 DIAGNOSIS — E872 Acidosis: Secondary | ICD-10-CM | POA: Diagnosis not present

## 2019-07-14 DIAGNOSIS — R161 Splenomegaly, not elsewhere classified: Secondary | ICD-10-CM | POA: Diagnosis not present

## 2019-07-14 DIAGNOSIS — N189 Chronic kidney disease, unspecified: Secondary | ICD-10-CM | POA: Diagnosis not present

## 2019-07-14 DIAGNOSIS — R809 Proteinuria, unspecified: Secondary | ICD-10-CM | POA: Diagnosis not present

## 2019-07-14 DIAGNOSIS — I129 Hypertensive chronic kidney disease with stage 1 through stage 4 chronic kidney disease, or unspecified chronic kidney disease: Secondary | ICD-10-CM | POA: Diagnosis not present

## 2019-07-19 ENCOUNTER — Ambulatory Visit: Payer: Medicare Other | Admitting: Family Medicine

## 2019-07-25 DIAGNOSIS — Z1212 Encounter for screening for malignant neoplasm of rectum: Secondary | ICD-10-CM | POA: Diagnosis not present

## 2019-07-25 DIAGNOSIS — Z1211 Encounter for screening for malignant neoplasm of colon: Secondary | ICD-10-CM | POA: Diagnosis not present

## 2019-07-25 IMAGING — DX DG CHEST 2V
2 series · 2 of 2 positions shown · non-contrast
Comparison: None in PACs

CLINICAL DATA: Follow-up of right lower lobe pneumonia in June 2017

EXAM:
CHEST - 2 VIEW

[chest pa]
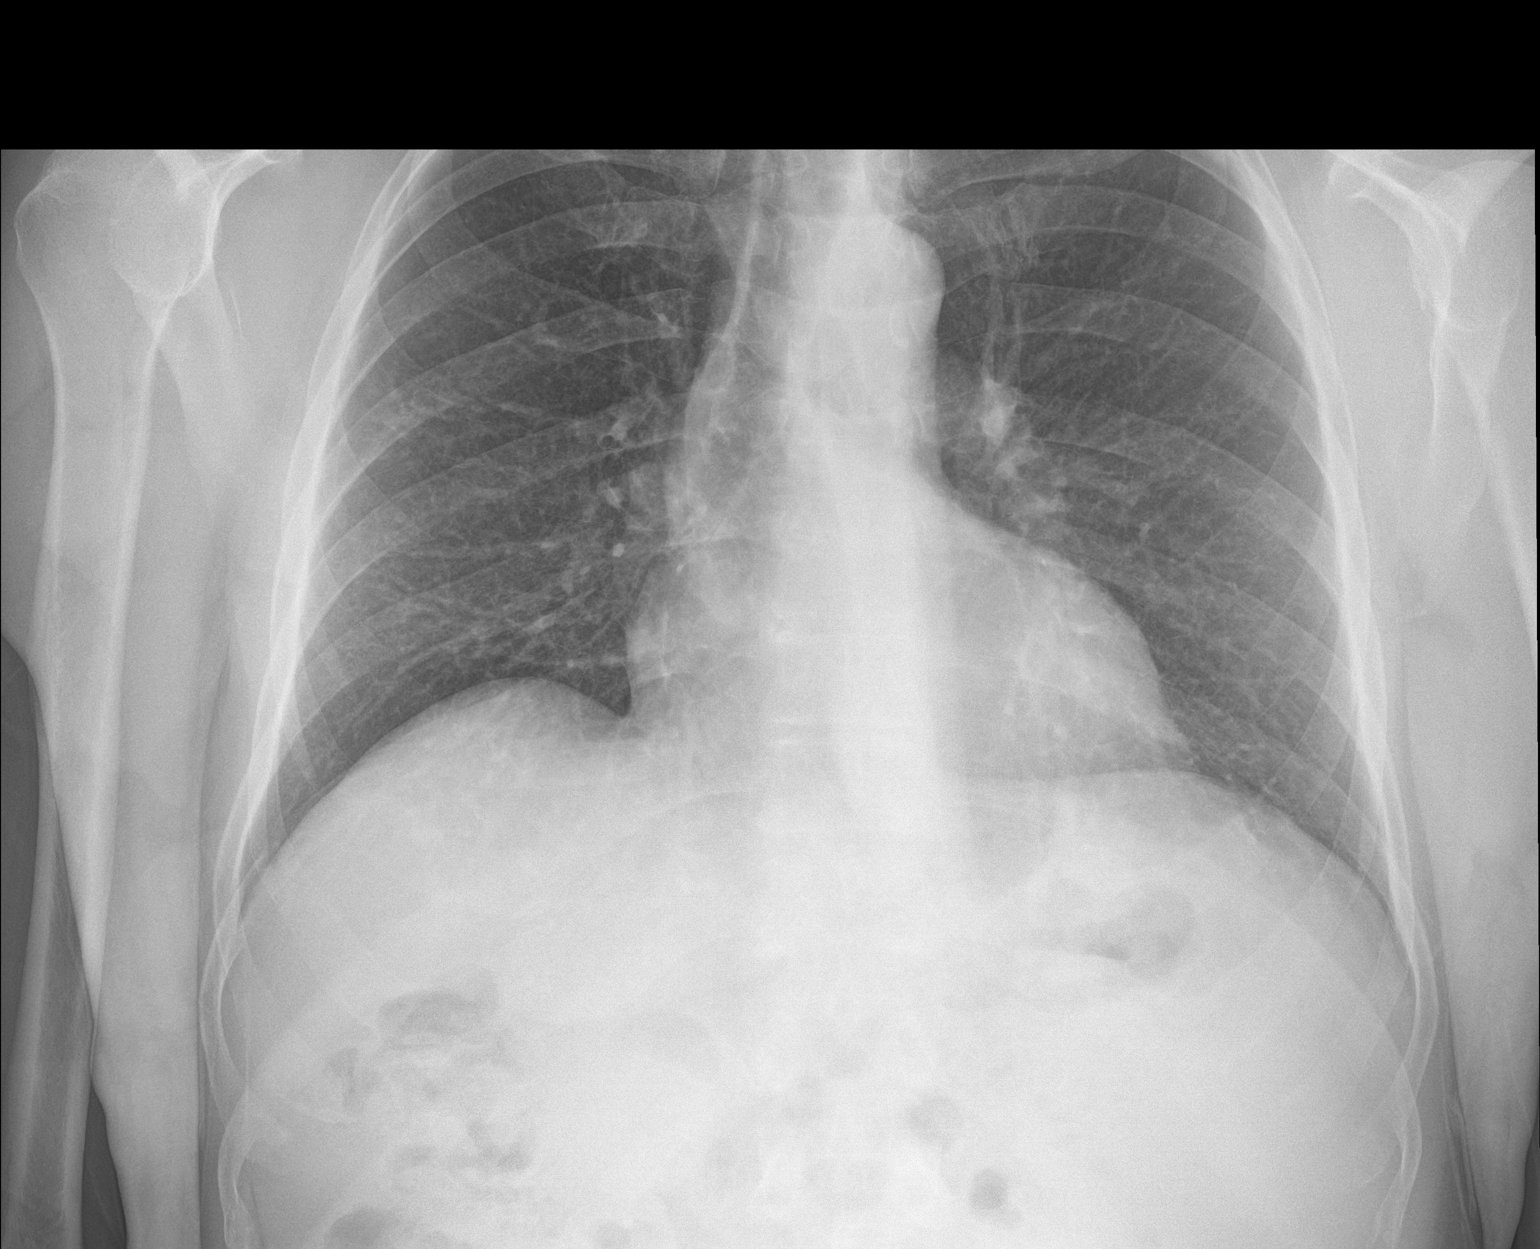

[chest lat]
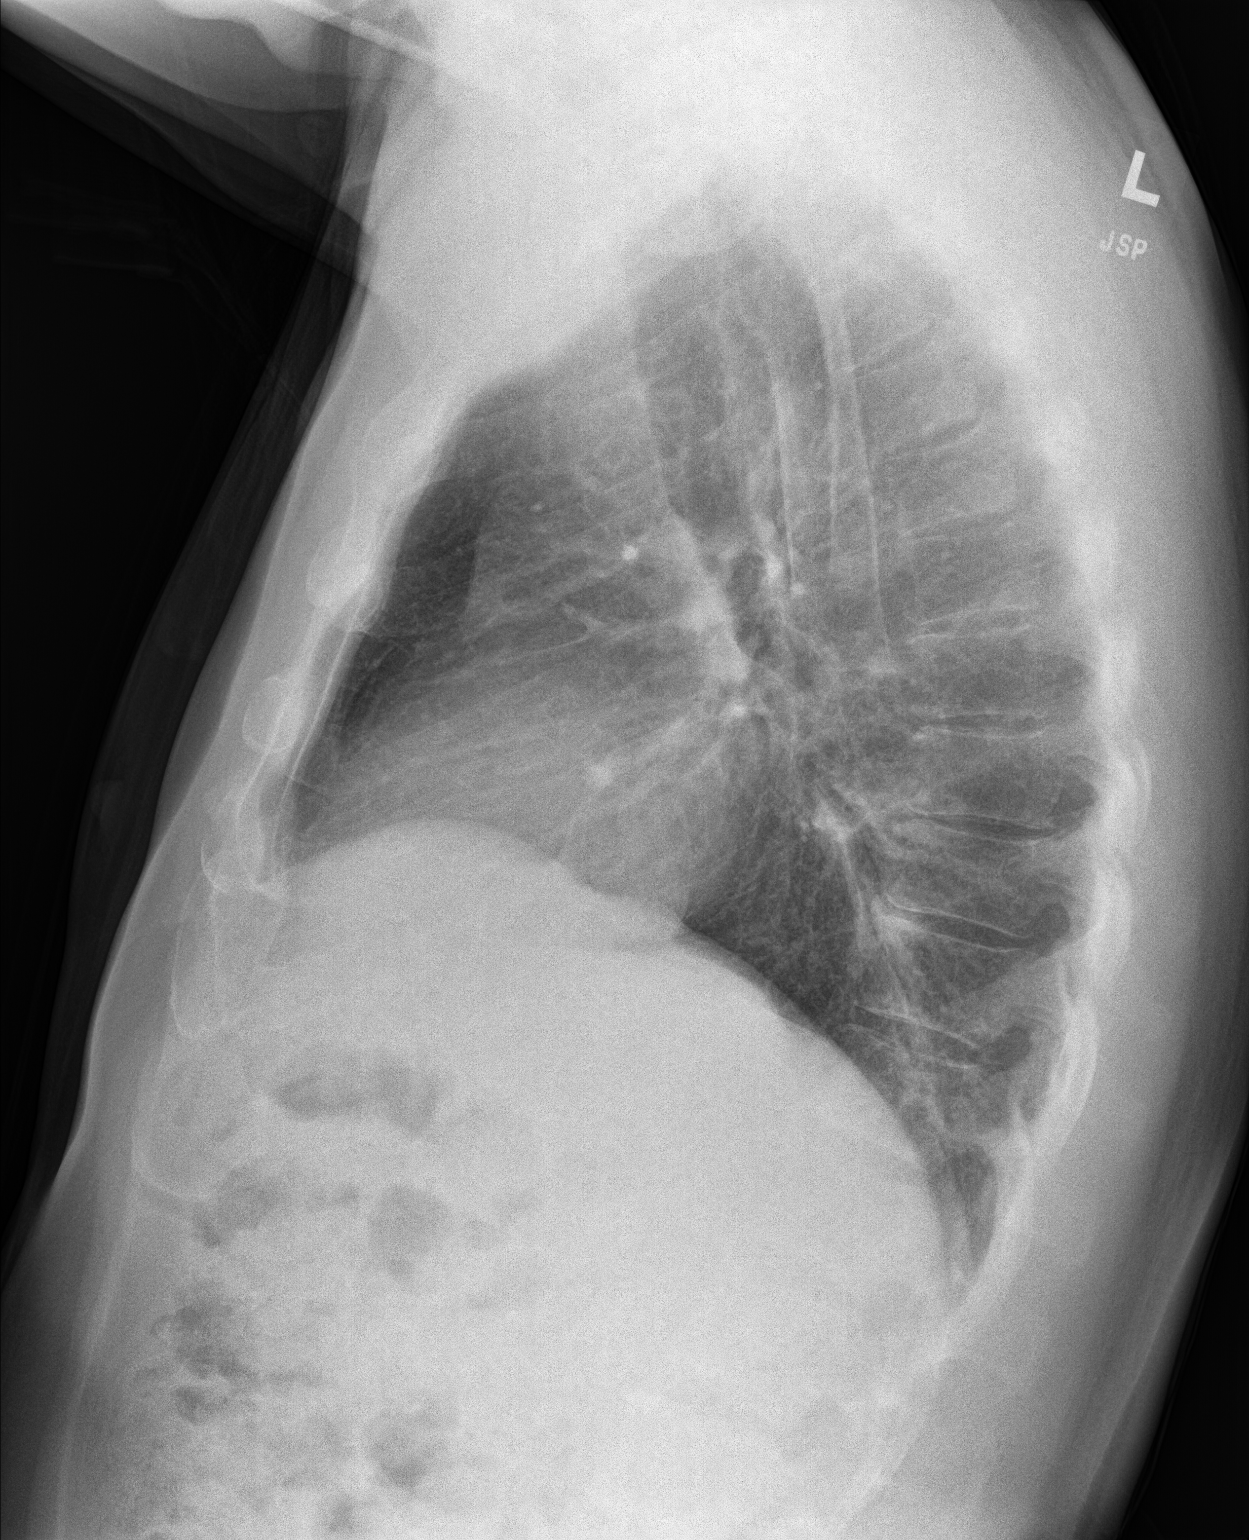

[2 of 2 positions shown; findings below may reference images not displayed]

FINDINGS: The lungs are adequately inflated. The interstitial markings are
mildly prominent. There is no alveolar infiltrate or pleural
effusion. The heart and pulmonary vascularity are normal. The
mediastinum is normal in width. The bony thorax exhibits no acute
abnormality.
IMPRESSION: There is no pneumonia. Mild interstitial prominence may be normal
for the patient or may reflect chronic bronchitic changes.

## 2019-07-28 ENCOUNTER — Other Ambulatory Visit: Payer: Self-pay

## 2019-07-28 ENCOUNTER — Encounter: Payer: Self-pay | Admitting: Family Medicine

## 2019-07-28 ENCOUNTER — Ambulatory Visit (INDEPENDENT_AMBULATORY_CARE_PROVIDER_SITE_OTHER): Payer: Medicare Other | Admitting: Family Medicine

## 2019-07-28 VITALS — BP 115/70 | HR 60 | Temp 97.7°F | Ht 69.0 in | Wt 213.0 lb

## 2019-07-28 DIAGNOSIS — N183 Chronic kidney disease, stage 3 unspecified: Secondary | ICD-10-CM

## 2019-07-28 DIAGNOSIS — E1122 Type 2 diabetes mellitus with diabetic chronic kidney disease: Secondary | ICD-10-CM

## 2019-07-28 DIAGNOSIS — Z23 Encounter for immunization: Secondary | ICD-10-CM | POA: Diagnosis not present

## 2019-07-28 DIAGNOSIS — Z794 Long term (current) use of insulin: Secondary | ICD-10-CM

## 2019-07-28 LAB — COLOGUARD: COLOGUARD: NEGATIVE

## 2019-07-28 NOTE — Progress Notes (Addendum)
Subjective: CC: DM2 w/ HLD and CKD3 HPI: Anthony French is a 64 y.o. male presenting to clinic today for:  Type 2 Diabetes w/ HLD and CKD3:  He established with Dr. Dorris Fetch and blood sugars are downtrending now.  He reports compliance with his insulins.  He is currently injecting 30 units in the daytime and 35 units in the evening time.  He is also taking Glucotrol XL 5 mg daily.  Has had a couple of midday hypoglycemic episodes into the 50s.  He does feel symptomatic during those times and often will have to reach for something like a honey bun to bring it up.  He has not yet discussed this with Dr. Dorris Fetch.  He is very interested in getting the freestyle libre and wants to know if he is a candidate for that.  He finds it difficult to get blood from his fingers often.  He is compliant with his losartan, Lipitor.  No chest pain, shortness of breath, lower extremity edema.  He is gained a little weight.   He continues to follow-up with Dr. Theador Hawthorne for his kidneys and Dr. Delton Coombes, as recommended by renal.  He has an appointment in July with Dr. Delton Coombes.  Last eye exam: Up-to-date Last foot exam: UTD Last A1c: Lab Results  Component Value Date   HGBA1C 9.0 05/05/2019   Nephropathy screen indicated?: On ACE-I for CKD3. Last flu, zoster and/or pneumovax: Shingles vaccine Immunization History  Administered Date(s) Administered  . Influenza,inj,Quad PF,6+ Mos 12/25/2016  . Pneumococcal Conjugate-13 12/27/2018  . Pneumococcal Polysaccharide-23 04/16/2017  . Tdap 08/19/2017   2.  Preventive care He notes that he just turned in his Cologuard on Monday.  He is looking forward to the results of that.  He wonders if he can get his shingles vaccine.  ROS: Per HPI  Past Medical History:  Diagnosis Date  . Diabetes ([place])   . Diabetic neuropathy ([place])   . Essential hypertension, benign 06/01/2019  . Hyperlipidemia    Allergies  Allergen Reactions  . Other Hives    Pt states that he is  allergic to an ABT but he can not remember what it is    Current Outpatient Medications:  .  atorvastatin (LIPITOR) 40 MG tablet, TAKE 1 TABLET BY MOUTH EVERY DAY, Disp: 90 tablet, Rfl: 3 .  blood glucose meter kit and supplies, Dispense based on patient and insurance preference. Use up to four times daily as directed. (FOR ICD-10 E10.9, E11.9). Check blood sugar twice a day as directed., Disp: 1 each, Rfl: 0 .  gabapentin (NEURONTIN) 300 MG capsule, TAKE 1 CAPSULE BY MOUTH IN THE MORNING AND TAKE 3 CAPSULES BY MOUTH IN THE EVENING (Patient taking differently: TAKE 1 CAPSULE BY MOUTH IN THE MORNING AND TAKE 2 CAPSULES BY MOUTH IN THE EVENING), Disp: 120 capsule, Rfl: 1 .  glipiZIDE (GLUCOTROL XL) 5 MG 24 hr tablet, Take 1 tablet (5 mg total) by mouth daily with breakfast., Disp: 30 tablet, Rfl: 3 .  glucose blood (ACCU-CHEK GUIDE) test strip, Test BS BID Dx E11.9, Disp: 200 strip, Rfl: 3 .  insulin NPH-regular Human (70-30) 100 UNIT/ML injection, Inject 30-35 Units into the skin 2 (two) times daily before a meal., Disp: , Rfl:  .  Insulin Pen Needle (PEN NEEDLES) 31G X 8 MM MISC, 1 each by Does not apply route in the morning and at bedtime., Disp: 100 each, Rfl: 11 .  losartan (COZAAR) 50 MG tablet, Take 1 tablet (50 mg total)  by mouth daily., Disp: 30 tablet, Rfl: 3 .  Vitamin D, Ergocalciferol, (DRISDOL) 1.25 MG (50000 UNIT) CAPS capsule, Take 50,000 Units by mouth once a week., Disp: , Rfl:  Social History   Socioeconomic History  . Marital status: Single    Spouse name: Not on file  . Number of children: 3  . Years of education: Not on file  . Highest education level: Not on file  Occupational History  . Occupation: retired  Tobacco Use  . Smoking status: Never Smoker  . Smokeless tobacco: Never Used  Vaping Use  . Vaping Use: Never used  Substance and Sexual Activity  . Alcohol use: Not Currently  . Drug use: Never  . Sexual activity: Not Currently  Other Topics Concern  . Not  on file  Social History Narrative  . Not on file   Social Determinants of Health   Financial Resource Strain:   . Difficulty of Paying Living Expenses:   Food Insecurity:   . Worried About Charity fundraiser in the Last Year:   . Arboriculturist in the Last Year:   Transportation Needs:   . Film/video editor (Medical):   Marland Kitchen Lack of Transportation (Non-Medical):   Physical Activity:   . Days of Exercise per Week:   . Minutes of Exercise per Session:   Stress:   . Feeling of Stress :   Social Connections:   . Frequency of Communication with Friends and Family:   . Frequency of Social Gatherings with Friends and Family:   . Attends Religious Services:   . Active Member of Clubs or Organizations:   . Attends Archivist Meetings:   Marland Kitchen Marital Status:   Intimate Partner Violence:   . Fear of Current or Ex-Partner:   . Emotionally Abused:   Marland Kitchen Physically Abused:   . Sexually Abused:    Family History  Problem Relation Age of Onset  . Stroke Mother   . Diabetes Mother   . Hypertension Mother   . Hyperlipidemia Mother   . Kidney disease Mother   . COPD Father   . Diabetes Sister   . Stroke Brother   . Diabetes Brother   . Arthritis Maternal Grandmother     Health Maintenance: colon cancer screening.  Objective: Office vital signs reviewed. BP (!) 151/90   Pulse 61   Temp 97.7 F (36.5 C)   Ht 5' 9" (1.753 m)   Wt 213 lb (96.6 kg)   BMI 31.45 kg/m   Physical Examination:  General: Awake, alert, well appearing male. No acute distress HEENT: sclera white, moist mucous membranes Cardio: regular rate and rhythm, S1S2 heard, no murmurs appreciated Pulm: clear to auscultation bilaterally, no wheezes, rhonchi or rales; normal work of breathing on room air Extremities: Warm, well perfused.  No edema.  No cyanosis or clubbing.  Assessment/ Plan: 64 y.o. male   1. Type 2 diabetes mellitus with stage 3 chronic kidney disease, with long-term current use of  insulin, unspecified whether stage 3a or 3b CKD (HCC) It sounds like he is making quite a bit of progress with Dr. Dorris Fetch.  He was certainly well prepared during today's exam show me 4 times daily blood sugar testing.  I do worry about the hypoglycemic episodes he is been having midday and wonder if you would benefit from something like Ozempic in lieu of the Glucotrol.  I think this can be used in renal patients.  This certainly would help him qualify  for the freestyle [place], which he is very interested in.  I will reach out to Dr. Dorris Fetch for his input.   2. Need for shingles vaccine Administered during today's visit - Varicella-zoster vaccine IM (Shingrix)   Anthony Nocera Windell Moulding, DO [place] [phone]

## 2019-07-28 NOTE — Patient Instructions (Signed)
I will reach out to Dr Dorris Fetch

## 2019-08-01 LAB — COLOGUARD: Cologuard: NEGATIVE

## 2019-08-03 ENCOUNTER — Other Ambulatory Visit: Payer: Self-pay | Admitting: Family Medicine

## 2019-08-10 ENCOUNTER — Encounter: Payer: Self-pay | Admitting: Nutrition

## 2019-08-23 ENCOUNTER — Other Ambulatory Visit: Payer: Self-pay

## 2019-08-23 ENCOUNTER — Other Ambulatory Visit (HOSPITAL_COMMUNITY): Payer: Self-pay | Admitting: *Deleted

## 2019-08-23 ENCOUNTER — Other Ambulatory Visit: Payer: Medicare Other

## 2019-08-23 DIAGNOSIS — D696 Thrombocytopenia, unspecified: Secondary | ICD-10-CM

## 2019-08-24 LAB — CBC WITH DIFFERENTIAL/PLATELET
Basophils Absolute: 0 10*3/uL (ref 0.0–0.2)
Basos: 1 %
EOS (ABSOLUTE): 0.2 10*3/uL (ref 0.0–0.4)
Eos: 5 %
Hematocrit: 41.3 % (ref 37.5–51.0)
Hemoglobin: 14.1 g/dL (ref 13.0–17.7)
Immature Grans (Abs): 0 10*3/uL (ref 0.0–0.1)
Immature Granulocytes: 0 %
Lymphocytes Absolute: 1.3 10*3/uL (ref 0.7–3.1)
Lymphs: 26 %
MCH: 29.7 pg (ref 26.6–33.0)
MCHC: 34.1 g/dL (ref 31.5–35.7)
MCV: 87 fL (ref 79–97)
Monocytes Absolute: 0.4 10*3/uL (ref 0.1–0.9)
Monocytes: 9 %
Neutrophils Absolute: 2.9 10*3/uL (ref 1.4–7.0)
Neutrophils: 59 %
Platelets: 150 10*3/uL (ref 150–450)
RBC: 4.75 x10E6/uL (ref 4.14–5.80)
RDW: 14.1 % (ref 11.6–15.4)
WBC: 4.9 10*3/uL (ref 3.4–10.8)

## 2019-08-25 ENCOUNTER — Other Ambulatory Visit: Payer: Self-pay | Admitting: "Endocrinology

## 2019-08-25 ENCOUNTER — Inpatient Hospital Stay (HOSPITAL_COMMUNITY): Payer: Medicare Other | Attending: Hematology

## 2019-08-25 DIAGNOSIS — Z794 Long term (current) use of insulin: Secondary | ICD-10-CM | POA: Insufficient documentation

## 2019-08-25 DIAGNOSIS — D696 Thrombocytopenia, unspecified: Secondary | ICD-10-CM | POA: Insufficient documentation

## 2019-08-25 DIAGNOSIS — E669 Obesity, unspecified: Secondary | ICD-10-CM | POA: Insufficient documentation

## 2019-08-25 DIAGNOSIS — N183 Chronic kidney disease, stage 3 unspecified: Secondary | ICD-10-CM | POA: Insufficient documentation

## 2019-08-25 DIAGNOSIS — R161 Splenomegaly, not elsewhere classified: Secondary | ICD-10-CM | POA: Insufficient documentation

## 2019-08-25 DIAGNOSIS — E119 Type 2 diabetes mellitus without complications: Secondary | ICD-10-CM | POA: Insufficient documentation

## 2019-09-01 ENCOUNTER — Inpatient Hospital Stay (HOSPITAL_BASED_OUTPATIENT_CLINIC_OR_DEPARTMENT_OTHER): Payer: Medicare Other | Admitting: Hematology

## 2019-09-01 ENCOUNTER — Other Ambulatory Visit: Payer: Self-pay

## 2019-09-01 VITALS — BP 152/92 | HR 66 | Temp 98.3°F | Resp 18 | Wt 212.1 lb

## 2019-09-01 DIAGNOSIS — Z794 Long term (current) use of insulin: Secondary | ICD-10-CM | POA: Diagnosis not present

## 2019-09-01 DIAGNOSIS — E119 Type 2 diabetes mellitus without complications: Secondary | ICD-10-CM | POA: Diagnosis not present

## 2019-09-01 DIAGNOSIS — N183 Chronic kidney disease, stage 3 unspecified: Secondary | ICD-10-CM | POA: Diagnosis not present

## 2019-09-01 DIAGNOSIS — D696 Thrombocytopenia, unspecified: Secondary | ICD-10-CM

## 2019-09-01 DIAGNOSIS — R161 Splenomegaly, not elsewhere classified: Secondary | ICD-10-CM | POA: Diagnosis not present

## 2019-09-01 DIAGNOSIS — E669 Obesity, unspecified: Secondary | ICD-10-CM | POA: Diagnosis not present

## 2019-09-01 NOTE — Patient Instructions (Signed)
[facility] at [facility] Discharge Instructions  You were seen today by Dr. Katragadda. He went over your recent results. Dr. Katragadda will see you back in 6 months for labs and follow up.   Thank you for choosing [facility] at [facility] to provide your oncology and hematology care.  To afford each patient quality time with our provider, please arrive at least 15 minutes before your scheduled appointment time.   If you have a lab appointment with the Cancer Center please come in thru the Main Entrance and check in at the main information desk  You need to re-schedule your appointment should you arrive 10 or more minutes late.  We strive to give you quality time with our providers, and arriving late affects you and other patients whose appointments are after yours.  Also, if you no show three or more times for appointments you may be dismissed from the clinic at the providers discretion.     Again, thank you for choosing [facility].  Our hope is that these requests will decrease the amount of time that you wait before being seen by our physicians.       _____________________________________________________________  Should you have questions after your visit to [facility], please contact our office at [phone] between the hours of 8:00 a.m. and 4:30 p.m.  Voicemails left after 4:00 p.m. will not be returned until the following business day.  For prescription refill requests, have your pharmacy contact our office and allow 72 hours.    Cancer Center Support Programs:   > Cancer Support Group  2nd Tuesday of the month 1pm-2pm, Journey Room    

## 2019-09-01 NOTE — Progress Notes (Signed)
Anthony French, [place] [address]   CLINIC:  Medical Oncology/Hematology  PCP:  Janora Norlander, DO [address] [place] [address]  [phone]  REASON FOR VISIT:  Follow-up for thrombocytopenia  PRIOR THERAPY: None  CURRENT THERAPY: Observation  INTERVAL HISTORY:  Mr. Anthony French, a 64 y.o. male, returns for routine follow-up for his thrombocytopenia. Anthony French was last seen on 05/26/2019.  Today he reports feeling well and denies having any bleeding. He denies having any infections, F/C or night sweats over the past 6 months.   REVIEW OF SYSTEMS:  Review of Systems  Constitutional: Negative for appetite change, chills, diaphoresis, fatigue and fever.  HENT:   Positive for lump/mass.   Musculoskeletal: Positive for arthralgias (3/10 L shoulder pain).  Neurological: Positive for numbness.  All other systems reviewed and are negative.   PAST MEDICAL/SURGICAL HISTORY:  Past Medical History:  Diagnosis Date  . Diabetes ([place])   . Diabetic neuropathy ([place])   . Essential hypertension, benign 06/01/2019  . Hyperlipidemia    Past Surgical History:  Procedure Laterality Date  . APPENDECTOMY  1980  . CYST REMOVAL TRUNK      SOCIAL HISTORY:  Social History   Socioeconomic History  . Marital status: Single    Spouse name: Not on file  . Number of children: 3  . Years of education: Not on file  . Highest education level: Not on file  Occupational History  . Occupation: retired  Tobacco Use  . Smoking status: Never Smoker  . Smokeless tobacco: Never Used  Vaping Use  . Vaping Use: Never used  Substance and Sexual Activity  . Alcohol use: Not Currently  . Drug use: Never  . Sexual activity: Not Currently  Other Topics Concern  . Not on file  Social History Narrative  . Not on file   Social Determinants of Health   Financial Resource Strain:   . Difficulty of Paying Living Expenses:   Food Insecurity:   . Worried  About Charity fundraiser in the Last Year:   . Arboriculturist in the Last Year:   Transportation Needs:   . Film/video editor (Medical):   Marland Kitchen Lack of Transportation (Non-Medical):   Physical Activity:   . Days of Exercise per Week:   . Minutes of Exercise per Session:   Stress:   . Feeling of Stress :   Social Connections:   . Frequency of Communication with Friends and Family:   . Frequency of Social Gatherings with Friends and Family:   . Attends Religious Services:   . Active Member of Clubs or Organizations:   . Attends Archivist Meetings:   Marland Kitchen Marital Status:   Intimate Partner Violence:   . Fear of Current or Ex-Partner:   . Emotionally Abused:   Marland Kitchen Physically Abused:   . Sexually Abused:     FAMILY HISTORY:  Family History  Problem Relation Age of Onset  . Stroke Mother   . Diabetes Mother   . Hypertension Mother   . Hyperlipidemia Mother   . Kidney disease Mother   . COPD Father   . Diabetes Sister   . Stroke Brother   . Diabetes Brother   . Arthritis Maternal Grandmother     CURRENT MEDICATIONS:  Current Outpatient Medications  Medication Sig Dispense Refill  . atorvastatin (LIPITOR) 40 MG tablet TAKE 1 TABLET BY MOUTH EVERY DAY 90 tablet 3  . blood  glucose meter kit and supplies Dispense based on patient and insurance preference. Use up to four times daily as directed. (FOR ICD-10 E10.9, E11.9). Check blood sugar twice a day as directed. 1 each 0  . gabapentin (NEURONTIN) 300 MG capsule TAKE 1 CAPSULE BY MOUTH IN THE MORNING AND TAKE 3 CAPSULES BY MOUTH IN THE EVENING 120 capsule 2  . glipiZIDE (GLUCOTROL XL) 5 MG 24 hr tablet TAKE 1 TABLET BY MOUTH EVERY DAY WITH BREAKFAST 90 tablet 0  . glucose blood (ACCU-CHEK GUIDE) test strip Test BS BID Dx E11.9 200 strip 3  . insulin NPH-regular Human (70-30) 100 UNIT/ML injection Inject 30-35 Units into the skin 2 (two) times daily before a meal.    . Insulin Pen Needle (PEN NEEDLES) 31G X 8 MM MISC 1  each by Does not apply route in the morning and at bedtime. 100 each 11  . losartan (COZAAR) 50 MG tablet TAKE 1 TABLET BY MOUTH EVERY DAY 90 tablet 0   No current facility-administered medications for this visit.    ALLERGIES:  Allergies  Allergen Reactions  . Other Hives    Pt states that he is allergic to an ABT but he can not remember what it is    PHYSICAL EXAM:  Performance status (ECOG): 1 - Symptomatic but completely ambulatory  There were no vitals filed for this visit. Wt Readings from Last 3 Encounters:  07/28/19 213 lb (96.6 kg)  07/13/19 208 lb (94.3 kg)  06/17/19 213 lb (96.6 kg)   Physical Exam Vitals reviewed.  Constitutional:      Appearance: Normal appearance. He is obese.  Cardiovascular:     Rate and Rhythm: Normal rate and regular rhythm.     Pulses: Normal pulses.     Heart sounds: Normal heart sounds.  Pulmonary:     Effort: Pulmonary effort is normal.     Breath sounds: Normal breath sounds.  Abdominal:     Palpations: Abdomen is soft. There is no hepatomegaly, splenomegaly or mass.     Tenderness: There is no abdominal tenderness.  Musculoskeletal:     Left shoulder: Swelling (lipoma in L anterior axillary line) present.     Right lower leg: No edema.     Left lower leg: No edema.  Neurological:     General: No focal deficit present.     Mental Status: He is alert and oriented to person, place, and time.  Psychiatric:        Mood and Affect: Mood normal.        Behavior: Behavior normal.     LABORATORY DATA:  I have reviewed the labs as listed.  CBC Latest Ref Rng & Units 08/23/2019 05/11/2019 07/21/2017  WBC 3.4 - 10.8 x10E3/uL 4.9 4.2 6.7  Hemoglobin 13.0 - 17.7 g/dL 14.1 13.4 12.2(L)  Hematocrit 37.5 - 51.0 % 41.3 38.8(L) 35.0(L)  Platelets 150 - 450 x10E3/uL 150 154 321   CMP Latest Ref Rng & Units 05/11/2019 04/14/2019 12/27/2018  Glucose 70 - 99 mg/dL 272(H) 461(HH) 332(H)  BUN 8 - 23 mg/dL 24(H) 29(H) 38(H)  Creatinine 0.61 - 1.24  mg/dL 1.71(H) 2.16(H) 1.86(H)  Sodium 135 - 145 mmol/L 141 135 139  Potassium 3.5 - 5.1 mmol/L 5.0 4.8 4.7  Chloride 98 - 111 mmol/L 106 100 102  CO2 22 - 32 mmol/L 24 18(L) 19(L)  Calcium 8.9 - 10.3 mg/dL 9.1 9.5 9.2  Total Protein 6.5 - 8.1 g/dL 6.7 - 6.5  Total Bilirubin 0.3 -  1.2 mg/dL 0.8 - 0.5  Alkaline Phos 38 - 126 U/L 72 - 85  AST 15 - 41 U/L 17 - 13  ALT 0 - 44 U/L 21 - 14      Component Value Date/Time   RBC 4.75 08/23/2019 1423   RBC 4.50 05/11/2019 1254   MCV 87 08/23/2019 1423   MCH 29.7 08/23/2019 1423   MCH 29.8 05/11/2019 1254   MCHC 34.1 08/23/2019 1423   MCHC 34.5 05/11/2019 1254   RDW 14.1 08/23/2019 1423   LYMPHSABS 1.3 08/23/2019 1423   MONOABS 0.4 05/11/2019 1254   EOSABS 0.2 08/23/2019 1423   BASOSABS 0.0 08/23/2019 1423    DIAGNOSTIC IMAGING:  I have independently reviewed the scans and discussed with the patient. No results found.   ASSESSMENT:  1.  Thrombocytopenia: -Seen at the request of Dr. Theador Hawthorne for thrombocytopenia. -CBC on 02/28/2019 showed platelet count 134. -Repeat CBC on blue top tube on 05/11/2019 showed platelet count 154. -Nutritional deficiency work-up negative.  Immunofixation normal.  Mildly increased free light chain ratio of 1.83.  2.  Splenomegaly: -Incidental finding on renal ultrasound on 04/22/2019, measuring 16.2 cm with volume of 551 mL. -No B symptoms.  No prior history of lymphoproliferative disorders.  3.  Elevated ferritin: -Labs on 02/28/2019 shows ferritin 977 with percent saturation of 34 at Dr. Toya Smothers office. -Repeat ferritin on 05/11/2019 showed 467 with percent saturation of 24. -Hemochromatosis work-up was negative for C282Y and H63D mutation.  4.  CKD: -Stage III CKD since 2019 thought to be secondary to diabetes. -Renal ultrasound on 04/22/2019 showed unremarkable kidneys.   PLAN:  1.  Thrombocytopenia: -We reviewed CBC from 08/23/2019.  Platelet count is 150. -We will see him back in 6 months for  follow-up with repeat labs.  2.  Splenomegaly: -Physical exam did not show palpable splenomegaly.  Does not have any B symptoms. -We will consider doing a PET scan should he develop any B symptoms.  3.  Elevated ferritin: -We will check ferritin and iron panel at next visit.  4.  CKD: -Follow-up with Dr. Theador Hawthorne.   Orders placed this encounter:  No orders of the defined types were placed in this encounter.    Anthony Jack, MD [place] [phone]   I, Milinda Antis, am acting as a scribe for Dr. Sanda Linger.  I, Anthony Jack MD, have reviewed the above documentation for accuracy and completeness, and I agree with the above.

## 2019-09-12 ENCOUNTER — Other Ambulatory Visit: Payer: Self-pay | Admitting: *Deleted

## 2019-09-12 ENCOUNTER — Telehealth: Payer: Self-pay | Admitting: Family Medicine

## 2019-09-12 MED ORDER — GLIPIZIDE ER 5 MG PO TB24: ORAL_TABLET | ORAL | 0 refills | Status: DC

## 2019-09-12 MED ORDER — ACCU-CHEK GUIDE VI STRP: ORAL_STRIP | 3 refills | Status: DC

## 2019-09-12 MED ORDER — ATORVASTATIN CALCIUM 40 MG PO TABS: 40.0000 mg | ORAL_TABLET | Freq: Every day | ORAL | 0 refills | Status: DC

## 2019-09-12 MED ORDER — GABAPENTIN 300 MG PO CAPS: ORAL_CAPSULE | ORAL | 0 refills | Status: DC

## 2019-09-12 MED ORDER — LOSARTAN POTASSIUM 50 MG PO TABS: 50.0000 mg | ORAL_TABLET | Freq: Every day | ORAL | 0 refills | Status: DC

## 2019-09-12 NOTE — Telephone Encounter (Signed)
Patient interested in [place] says it will pay for it Requested patient give me the telephone/fax number where I am supposed to send it! He will bring tomorrow after his lab work

## 2019-09-13 ENCOUNTER — Other Ambulatory Visit: Payer: Self-pay

## 2019-09-13 ENCOUNTER — Other Ambulatory Visit: Payer: Medicare Other

## 2019-09-13 ENCOUNTER — Telehealth: Payer: Self-pay | Admitting: Pharmacist

## 2019-09-13 DIAGNOSIS — E782 Mixed hyperlipidemia: Secondary | ICD-10-CM | POA: Diagnosis not present

## 2019-09-13 DIAGNOSIS — E1121 Type 2 diabetes mellitus with diabetic nephropathy: Secondary | ICD-10-CM | POA: Diagnosis not present

## 2019-09-13 DIAGNOSIS — I129 Hypertensive chronic kidney disease with stage 1 through stage 4 chronic kidney disease, or unspecified chronic kidney disease: Secondary | ICD-10-CM | POA: Diagnosis not present

## 2019-09-13 DIAGNOSIS — N1832 Chronic kidney disease, stage 3b: Secondary | ICD-10-CM

## 2019-09-13 DIAGNOSIS — Z794 Long term (current) use of insulin: Secondary | ICD-10-CM | POA: Diagnosis not present

## 2019-09-13 DIAGNOSIS — N189 Chronic kidney disease, unspecified: Secondary | ICD-10-CM | POA: Diagnosis not present

## 2019-09-13 DIAGNOSIS — I10 Essential (primary) hypertension: Secondary | ICD-10-CM | POA: Diagnosis not present

## 2019-09-13 DIAGNOSIS — R809 Proteinuria, unspecified: Secondary | ICD-10-CM | POA: Diagnosis not present

## 2019-09-13 DIAGNOSIS — E1129 Type 2 diabetes mellitus with other diabetic kidney complication: Secondary | ICD-10-CM | POA: Diagnosis not present

## 2019-09-13 DIAGNOSIS — E1122 Type 2 diabetes mellitus with diabetic chronic kidney disease: Secondary | ICD-10-CM | POA: Diagnosis not present

## 2019-09-13 LAB — LIPID PANEL
Cholesterol: 109 (ref 0–200)
HDL: 33 — AB (ref 35–70)
LDL Cholesterol: 53

## 2019-09-13 LAB — MICROALBUMIN, URINE: Microalb, Ur: 237.3

## 2019-09-13 LAB — TSH: TSH: 4.86 (ref <=5.90)

## 2019-09-13 MED ORDER — FREESTYLE LIBRE 2 SENSOR MISC: 4 refills | Status: DC

## 2019-09-13 MED ORDER — FREESTYLE LIBRE 2 READER DEVI: 0 refills | Status: DC

## 2019-09-13 NOTE — Telephone Encounter (Signed)
Patient interested in freestyle libre CGM Will call in to [facility] per insurance request Patient only on once daily insulin--will have to add sliding scale for patient to qualify  Insurance phone #[phone]

## 2019-09-14 LAB — LIPID PANEL
Chol/HDL Ratio: 3.3 ratio (ref 0.0–5.0)
Cholesterol, Total: 109 mg/dL (ref 100–199)
HDL: 33 mg/dL — ABNORMAL LOW (ref >39)
LDL Chol Calc (NIH): 53 mg/dL (ref 0–99)
Triglycerides: 126 mg/dL (ref 0–149)
VLDL Cholesterol Cal: 23 mg/dL (ref 5–40)

## 2019-09-14 LAB — MICROALBUMIN / CREATININE URINE RATIO
Creatinine, Urine: 158.8 mg/dL
Microalb/Creat Ratio: 149 mg/g creat — ABNORMAL HIGH (ref 0–29)
Microalbumin, Urine: 237.3 ug/mL

## 2019-09-14 LAB — COMPREHENSIVE METABOLIC PANEL
ALT: 16 IU/L (ref 0–44)
AST: 13 IU/L (ref 0–40)
Albumin/Globulin Ratio: 1.9 (ref 1.2–2.2)
Albumin: 4 g/dL (ref 3.8–4.8)
Alkaline Phosphatase: 76 IU/L (ref 48–121)
BUN/Creatinine Ratio: 16 (ref 10–24)
BUN: 28 mg/dL — ABNORMAL HIGH (ref 8–27)
Bilirubin Total: 0.5 mg/dL (ref 0.0–1.2)
CO2: 22 mmol/L (ref 20–29)
Calcium: 8.6 mg/dL (ref 8.6–10.2)
Chloride: 110 mmol/L — ABNORMAL HIGH (ref 96–106)
Creatinine, Ser: 1.72 mg/dL — ABNORMAL HIGH (ref 0.76–1.27)
GFR calc Af Amer: 47 mL/min/{1.73_m2} — ABNORMAL LOW (ref >59)
GFR calc non Af Amer: 41 mL/min/{1.73_m2} — ABNORMAL LOW (ref >59)
Globulin, Total: 2.1 g/dL (ref 1.5–4.5)
Glucose: 206 mg/dL — ABNORMAL HIGH (ref 65–99)
Potassium: 4.9 mmol/L (ref 3.5–5.2)
Sodium: 143 mmol/L (ref 134–144)
Total Protein: 6.1 g/dL (ref 6.0–8.5)

## 2019-09-14 LAB — T4, FREE: Free T4: 0.87 ng/dL (ref 0.82–1.77)

## 2019-09-14 LAB — TSH: TSH: 4.86 u[IU]/mL — ABNORMAL HIGH (ref 0.450–4.500)

## 2019-09-21 ENCOUNTER — Ambulatory Visit: Payer: Medicare Other | Admitting: Nutrition

## 2019-09-21 ENCOUNTER — Other Ambulatory Visit: Payer: Self-pay

## 2019-09-21 ENCOUNTER — Ambulatory Visit (INDEPENDENT_AMBULATORY_CARE_PROVIDER_SITE_OTHER): Payer: Medicare Other | Admitting: Nurse Practitioner

## 2019-09-21 ENCOUNTER — Encounter: Payer: Self-pay | Admitting: Nurse Practitioner

## 2019-09-21 VITALS — BP 163/99 | HR 85 | Ht 69.0 in | Wt 216.6 lb

## 2019-09-21 DIAGNOSIS — E782 Mixed hyperlipidemia: Secondary | ICD-10-CM | POA: Diagnosis not present

## 2019-09-21 DIAGNOSIS — E1121 Type 2 diabetes mellitus with diabetic nephropathy: Secondary | ICD-10-CM | POA: Diagnosis not present

## 2019-09-21 DIAGNOSIS — I10 Essential (primary) hypertension: Secondary | ICD-10-CM

## 2019-09-21 DIAGNOSIS — Z794 Long term (current) use of insulin: Secondary | ICD-10-CM | POA: Diagnosis not present

## 2019-09-21 DIAGNOSIS — R7989 Other specified abnormal findings of blood chemistry: Secondary | ICD-10-CM | POA: Diagnosis not present

## 2019-09-21 DIAGNOSIS — N1832 Chronic kidney disease, stage 3b: Secondary | ICD-10-CM

## 2019-09-21 LAB — POCT GLYCOSYLATED HEMOGLOBIN (HGB A1C): Hemoglobin A1C: 6.4 % — AB (ref 4.0–5.6)

## 2019-09-21 MED ORDER — ACCU-CHEK GUIDE VI STRP: ORAL_STRIP | 3 refills | Status: DC

## 2019-09-21 NOTE — Patient Instructions (Signed)

## 2019-09-21 NOTE — Progress Notes (Signed)
09/21/2019, 11:40 AM  Endocrinology follow-up note   Subjective:    Patient ID: Anthony French, male    DOB: 05-25-1955.  Anthony French is being seen in follow-up after he was seen in consultation for management of currently uncontrolled symptomatic diabetes requested by  Janora Norlander, DO.   Past Medical History:  Diagnosis Date  . Diabetes ([place])   . Diabetic neuropathy ([place])   . Essential hypertension, benign 06/01/2019  . Hyperlipidemia     Past Surgical History:  Procedure Laterality Date  . APPENDECTOMY  1980  . CYST REMOVAL TRUNK      Social History   Socioeconomic History  . Marital status: Single    Spouse name: Not on file  . Number of children: 3  . Years of education: Not on file  . Highest education level: Not on file  Occupational History  . Occupation: retired  Tobacco Use  . Smoking status: Never Smoker  . Smokeless tobacco: Never Used  Vaping Use  . Vaping Use: Never used  Substance and Sexual Activity  . Alcohol use: Not Currently  . Drug use: Never  . Sexual activity: Not Currently  Other Topics Concern  . Not on file  Social History Narrative  . Not on file   Social Determinants of Health   Financial Resource Strain:   . Difficulty of Paying Living Expenses:   Food Insecurity:   . Worried About Charity fundraiser in the Last Year:   . Arboriculturist in the Last Year:   Transportation Needs:   . Film/video editor (Medical):   Marland Kitchen Lack of Transportation (Non-Medical):   Physical Activity:   . Days of Exercise per Week:   . Minutes of Exercise per Session:   Stress:   . Feeling of Stress :   Social Connections:   . Frequency of Communication with Friends and Family:   . Frequency of Social Gatherings with Friends and Family:   . Attends Religious Services:   . Active Member of Clubs or Organizations:   . Attends Archivist Meetings:   Marland Kitchen Marital Status:     Family History  Problem  Relation Age of Onset  . Stroke Mother   . Diabetes Mother   . Hypertension Mother   . Hyperlipidemia Mother   . Kidney disease Mother   . COPD Father   . Diabetes Sister   . Stroke Brother   . Diabetes Brother   . Arthritis Maternal Grandmother     Outpatient Encounter Medications as of 09/21/2019  Medication Sig  . atorvastatin (LIPITOR) 40 MG tablet Take 1 tablet (40 mg total) by mouth daily.  . blood glucose meter kit and supplies Dispense based on patient and insurance preference. Use up to four times daily as directed. (FOR ICD-10 E10.9, E11.9). Check blood sugar twice a day as directed.  . Continuous Blood Gluc Receiver (FREESTYLE LIBRE 2 READER) DEVI Use to test blood sugars up to 6 times daily. DX: E11.9  . Continuous Blood Gluc Sensor (FREESTYLE LIBRE 2 SENSOR) MISC Use to test blood sugars up to 6 times daily. DX: E11.9  . gabapentin (NEURONTIN) 300 MG capsule TAKE 1 CAPSULE BY MOUTH IN THE MORNING AND TAKE 3 CAPSULES BY MOUTH IN THE EVENING  . glipiZIDE (GLUCOTROL XL) 5 MG 24 hr tablet TAKE 1 TABLET BY MOUTH EVERY DAY WITH BREAKFAST  . glucose blood (ACCU-CHEK GUIDE) test strip Test BS BID  Dx E11.9  . insulin NPH-regular Human (70-30) 100 UNIT/ML injection Inject 30-35 Units into the skin 2 (two) times daily before a meal.  . Insulin Pen Needle (PEN NEEDLES) 31G X 8 MM MISC 1 each by Does not apply route in the morning and at bedtime.  Marland Kitchen losartan (COZAAR) 50 MG tablet Take 1 tablet (50 mg total) by mouth daily.   No facility-administered encounter medications on file as of 09/21/2019.    ALLERGIES: Allergies  Allergen Reactions  . Other Hives    Pt states that he is allergic to an ABT but he can not remember what it is    VACCINATION STATUS: Immunization History  Administered Date(s) Administered  . Influenza,inj,Quad PF,6+ Mos 12/25/2016  . Pneumococcal Conjugate-13 12/27/2018  . Pneumococcal Polysaccharide-23 04/16/2017  . Tdap 08/19/2017  . Zoster Recombinat  (Shingrix) 07/28/2019    Diabetes He presents for his follow-up diabetic visit. He has type 2 diabetes mellitus. Onset time: He was diagnosed at approximate age of 64 years. His disease course has been improving. There are no hypoglycemic associated symptoms. Pertinent negatives for hypoglycemia include no headaches, nervousness/anxiousness, pallor or tremors. Pertinent negatives for diabetes include no chest pain, no fatigue, no polydipsia, no polyphagia and no polyuria. There are no hypoglycemic complications. Symptoms are improving. Diabetic complications include nephropathy and peripheral neuropathy. Risk factors for coronary artery disease include dyslipidemia, diabetes mellitus, hypertension, male sex, sedentary lifestyle and family history. Current diabetic treatment includes insulin injections. He is compliant with treatment most of the time. His weight is increasing steadily. He is following a generally unhealthy diet. When asked about meal planning, he reported none. He has had a previous visit with a dietitian (Is scheduled for appointment with Jearld Fenton, RDE). He never participates in exercise. His home blood glucose trend is decreasing steadily. His breakfast blood glucose range is generally 140-180 mg/dl. His lunch blood glucose range is generally 90-110 mg/dl. His dinner blood glucose range is generally 180-200 mg/dl. His bedtime blood glucose range is generally 180-200 mg/dl. (He presents today with his meter and logs showing slightly above target fasting and postprandial glycemic profile.  He does report some mild, random postprandial hypoglycemia.  His POCT A1c today is 6.4%, improving from last visit which was 9%.  His PCP is currently attempting to get insurance approval for CGM (freestyle [place]).) An ACE inhibitor/angiotensin II receptor blocker is not being taken. He does not see a podiatrist.Eye exam is current.  Hyperlipidemia This is a chronic problem. The current episode started  more than 1 year ago. The problem is controlled. Recent lipid tests were reviewed and are normal. Exacerbating diseases include chronic renal disease and diabetes. There are no known factors aggravating his hyperlipidemia. Pertinent negatives include no chest pain, myalgias or shortness of breath. Current antihyperlipidemic treatment includes statins. The current treatment provides significant improvement of lipids. There are no compliance problems.  Risk factors for coronary artery disease include diabetes mellitus, dyslipidemia, hypertension, male sex and a sedentary lifestyle.  Hypertension This is a chronic problem. The problem is uncontrolled. Pertinent negatives include no chest pain, headaches, palpitations or shortness of breath. Risk factors for coronary artery disease include dyslipidemia, diabetes mellitus, family history, male gender and sedentary lifestyle. Past treatments include angiotensin blockers. The current treatment provides mild improvement. There are no compliance problems.  Hypertensive end-organ damage includes kidney disease. Identifiable causes of hypertension include chronic renal disease and a thyroid problem.  Thyroid Problem Presents for initial (Abnormality noted with recent thyroid studies.  No prior work-up done, no previous labs to compare to.) visit. Onset time: Unknown. Symptoms include weight gain. Patient reports no anxiety, cold intolerance, constipation, diarrhea, fatigue, heat intolerance, palpitations or tremors. Past treatments include nothing. His past medical history is significant for diabetes and hyperlipidemia. There are no known risk factors.     Review of Systems  Constitutional: Positive for weight gain. Negative for fatigue and unexpected weight change.  Eyes: Negative for visual disturbance.  Respiratory: Negative for cough, choking and shortness of breath.   Cardiovascular: Negative for chest pain, palpitations and leg swelling.  Gastrointestinal:  Negative for constipation and diarrhea.  Endocrine: Negative for cold intolerance, heat intolerance, polydipsia, polyphagia and polyuria.  Musculoskeletal: Negative for gait problem and myalgias.  Skin: Negative for pallor, rash and wound.  Neurological: Negative for tremors, numbness and headaches.  Psychiatric/Behavioral: The patient is not nervous/anxious.     Objective:    Vitals with BMI 09/21/2019 09/01/2019 07/28/2019  Height _0  - -  Weight 216 lbs 10 oz 212 lbs 2 oz -  BMI 41.74 - -  Systolic 081 448 185  Diastolic 99 92 70  Pulse 85 66 60    BP (!) 163/99 (BP Location: Right Arm, Patient Position: Sitting)   Pulse 85   Ht _1  (1.753 m)   Wt 216 lb 9.6 oz (98.2 kg)   BMI 31.99 kg/m   Wt Readings from Last 3 Encounters:  09/21/19 216 lb 9.6 oz (98.2 kg)  09/01/19 (!) 212 lb 1.6 oz (96.2 kg)  07/28/19 213 lb (96.6 kg)     BP Readings from Last 3 Encounters:  09/21/19 (!) 163/99  09/01/19 (!) 152/92  07/28/19 115/70     Physical Exam- Limited  Constitutional:  Body mass index is 31.99 kg/m. , not in acute distress, normal state of mind Eyes:  EOMI, no exophthalmos Neck: Supple Thyroid: No gross goiter, no palpable nodules Cardiovascular: RRR, no murmers, rubs, or gallops, no edema Respiratory: Adequate breathing efforts, no crackles, rales, rhonchi, or wheezing Musculoskeletal: no gross deformities, strength intact in all four extremities, no gross restriction of joint movements Skin:  no rashes, no hyperemia Neurological: no tremor with outstretched hands   CMP ( most recent) CMP     Component Value Date/Time   NA 143 09/13/2019 1420   K 4.9 09/13/2019 1420   CL 110 (H) 09/13/2019 1420   CO2 22 09/13/2019 1420   GLUCOSE 206 (H) 09/13/2019 1420   GLUCOSE 272 (H) 05/11/2019 1254   BUN 28 (H) 09/13/2019 1420   CREATININE 1.72 (H) 09/13/2019 1420   CALCIUM 8.6 09/13/2019 1420   PROT 6.1 09/13/2019 1420   ALBUMIN 4.0 09/13/2019 1420   AST 13  09/13/2019 1420   ALT 16 09/13/2019 1420   ALKPHOS 76 09/13/2019 1420   BILITOT 0.5 09/13/2019 1420   GFRNONAA 41 (L) 09/13/2019 1420   GFRAA 47 (L) 09/13/2019 1420    Diabetic Labs (most recent): Lab Results  Component Value Date   HGBA1C 9.0 05/05/2019   HGBA1C 13.3 (H) 04/14/2019   HGBA1C 8.6 (H) 12/27/2018     Lipid Panel ( most recent) Lipid Panel     Component Value Date/Time   CHOL 109 09/13/2019 1420   TRIG 126 09/13/2019 1420   HDL 33 (L) 09/13/2019 1420   CHOLHDL 3.3 09/13/2019 1420   LDLCALC 53 09/13/2019 1420   LABVLDL 23 09/13/2019 1420     Assessment & Plan:   1. Type 2 diabetes mellitus  with stage 3b chronic kidney disease, with long-term current use of insulin ([place])   - [place] Decoste has currently uncontrolled symptomatic type 2 DM since  64 years of age.   Recent labs reviewed.  He presents today with his meter and logs showing slightly above target fasting and postprandial glycemic profile.  He does report some mild, frequent, random postprandial hypoglycemia.  His POCT A1c today is 6.4%, improving from last visit which was 9%.  His PCP is currently attempting to get insurance approval for CGM (freestyle [place]).  - I had a long discussion with him about the progressive nature of diabetes and the pathology behind its complications. -his diabetes is complicated by CKD, peripheral neuropathy and he remains at a high risk for more acute and chronic complications which include CAD, CVA, CKD, retinopathy, and neuropathy. These are all discussed in detail with him.  - The patient admits there is a room for improvement in their diet and drink choices. -  Suggestion is made for the patient to avoid simple carbohydrates from their diet including Cakes, Sweet Desserts / Pastries, Ice Cream, Soda (diet and regular), Sweet Tea, Candies, Chips, Cookies, Sweet Pastries,  Store Bought Juices, Alcohol in Excess of  1-2 drinks a day, Artificial Sweeteners, Coffee Creamer, and  "Sugar-free" Products. This will help patient to have stable blood glucose profile and potentially avoid unintended weight gain.   - I encouraged the patient to switch to  unprocessed or minimally processed complex starch and increased protein intake (animal or plant source), fruits, and vegetables.   - Patient is advised to stick to a routine mealtimes to eat 3 meals  a day and avoid unnecessary snacks ( to snack only to correct hypoglycemia).  - he is scheduled with Jearld Fenton, RDN, CDE for diabetes education.  - I have approached him with the following individualized plan to manage  his diabetes and patient agrees:   - he will continue to benefit from simplicity of premixed insulin.  Given his frequent postprandial hypoglycemia, he is advised to decrease his Novolin 70/30 to 30 units with breakfast and 30 units with supper, when glucose readings above 90 and he is eating. He is benefiting from, and advised to continue, Glipizide 5 mg XL daily with breakfast.  - he is warned not to take insulin without proper monitoring per orders. - Adjustment parameters are given to him for hypo and hyperglycemia in writing. - he is encouraged to call clinic for blood glucose levels less than 70 or above 300 mg /dl.  - Specific targets for  A1c;  LDL, HDL,  and Triglycerides were discussed with the patient.  2) Blood Pressure /Hypertension:  His blood pressure is not controlled to target.  He has a follow up appointment with his nephrologist next week.  I encouraged patient to discuss elevated BP readings with them, and allow them to make any necessary adjustments.  He is advised to continue Cozaar 50 mg po daily.  3) Lipids/Hyperlipidemia:  Review of his recent lipid panel shows controlled LDL of 53 and triglycerides improving to 126.  He is advised to continue atorvastatin 40 mg p.o. daily at bedtime.  Side effects and precautions discussed with him.  4)  Weight/Diet:   Body mass index is 31.99  kg/m.     he is  a candidate for weight loss. I discussed with him the fact that loss of 5 - 10% of his  current body weight will have the most impact on his  diabetes management.  Exercise, and detailed carbohydrates information provided  -  detailed on discharge instructions.  5) Elevated TSH: Recent thyroid studies show TSH of 4.8 and free T4 of 0.87.  There is no other thyroid panel to compare this to.  Patient denies any family history of thyroid problems.  He denies any signs and symptoms of hypothyroidism.  Discussed possibility of initiating low-dose of levothyroxine 25 mg p.o. daily before breakfast if thyroid studies continue to suggest hypothyroidism on subsequent visit.  Will recheck thyroid studies prior to next visit in 3 months.  6) Chronic Care/Health Maintenance:  -he  is on  ARB and Statin medications and  is encouraged to initiate and continue to follow up with Ophthalmology, Dentist,  Podiatrist at least yearly or according to recommendations, and advised to   stay away from smoking. I have recommended yearly flu vaccine and pneumonia vaccine at least every 5 years; moderate intensity exercise for up to 150 minutes weekly; and  sleep for at least 7 hours a day.  - he is  advised to maintain close follow up with Janora Norlander, DO for primary care needs, as well as his other providers for optimal and coordinated care.   - Time spent on this patient care encounter:  35 min, of which > 50% was spent in  counseling and the rest reviewing his blood glucose logs , discussing his hypoglycemia and hyperglycemia episodes, reviewing his current and  previous labs / studies  ( including abstraction from other facilities) and medications  doses and developing a  long term treatment plan and documenting his care.   Please refer to Patient Instructions for Blood Glucose Monitoring and Insulin/Medications Dosing Guide"  in media tab for additional information. Please  also refer to " Patient  Self Inventory" in the Media  tab for reviewed elements of pertinent patient history.  [name] participated in the discussions, expressed understanding, and voiced agreement with the above plans.  All questions were answered to his satisfaction. he is encouraged to contact clinic should he have any questions or concerns prior to his return visit.   Follow up plan: - No follow-ups on file.  Rayetta Pigg, FNP-BC [facility] Phone: [phone] Fax: [phone]   09/21/2019, 11:40 AM  This note was partially dictated with voice recognition software. Similar sounding words can be transcribed inadequately or may not  be corrected upon review.

## 2019-09-26 ENCOUNTER — Other Ambulatory Visit: Payer: Self-pay | Admitting: *Deleted

## 2019-09-26 MED ORDER — "INSULIN SYRINGE 31G X 5/16"" 0.3 ML MISC": 3 refills | Status: DC

## 2019-09-28 DIAGNOSIS — D696 Thrombocytopenia, unspecified: Secondary | ICD-10-CM | POA: Diagnosis not present

## 2019-09-28 DIAGNOSIS — I129 Hypertensive chronic kidney disease with stage 1 through stage 4 chronic kidney disease, or unspecified chronic kidney disease: Secondary | ICD-10-CM | POA: Diagnosis not present

## 2019-09-28 DIAGNOSIS — E870 Hyperosmolality and hypernatremia: Secondary | ICD-10-CM | POA: Diagnosis not present

## 2019-09-28 DIAGNOSIS — N189 Chronic kidney disease, unspecified: Secondary | ICD-10-CM | POA: Diagnosis not present

## 2019-09-28 DIAGNOSIS — R809 Proteinuria, unspecified: Secondary | ICD-10-CM | POA: Diagnosis not present

## 2019-09-29 MED ORDER — FREESTYLE LIBRE 2 READER DEVI: 0 refills | Status: DC

## 2019-09-29 MED ORDER — FREESTYLE LIBRE 2 SENSOR MISC: 4 refills | Status: DC

## 2019-09-29 NOTE — Addendum Note (Signed)
Addended by: Lottie Dawson D on: 09/29/2019 04:05 PM   Modules accepted: Orders

## 2019-09-29 NOTE — Telephone Encounter (Signed)
Will submit to [facility] for freestyle [place]

## 2019-10-06 ENCOUNTER — Telehealth: Payer: Self-pay | Admitting: Pharmacist

## 2019-10-06 NOTE — Telephone Encounter (Signed)
Patient approved for [place] 2 via [facility] Will ship to patient in 3-5 days

## 2019-10-07 ENCOUNTER — Telehealth: Payer: Self-pay | Admitting: Family Medicine

## 2019-10-07 DIAGNOSIS — Z794 Long term (current) use of insulin: Secondary | ICD-10-CM | POA: Diagnosis not present

## 2019-10-07 DIAGNOSIS — E119 Type 2 diabetes mellitus without complications: Secondary | ICD-10-CM | POA: Diagnosis not present

## 2019-10-10 ENCOUNTER — Other Ambulatory Visit: Payer: Self-pay

## 2019-10-10 ENCOUNTER — Ambulatory Visit (INDEPENDENT_AMBULATORY_CARE_PROVIDER_SITE_OTHER): Payer: Medicare Other | Admitting: Pharmacist

## 2019-10-10 ENCOUNTER — Encounter: Payer: Self-pay | Admitting: Pharmacist

## 2019-10-10 VITALS — BP 147/88 | HR 60

## 2019-10-10 DIAGNOSIS — E119 Type 2 diabetes mellitus without complications: Secondary | ICD-10-CM

## 2019-10-10 MED ORDER — LOSARTAN POTASSIUM 50 MG PO TABS: 50.0000 mg | ORAL_TABLET | Freq: Every day | ORAL | 0 refills | Status: DC

## 2019-10-10 NOTE — Telephone Encounter (Signed)
appt schedule 8/30

## 2019-10-10 NOTE — Progress Notes (Signed)
    10/10/2019 Name: Anthony French MRN: [id number] DOB: January 05, 1956   S:  36 yoF presents for diabetes evaluation, education, and management Patient was referred and last seen by Primary Care Provider on 07/28/19. Patient sees endocrine for diabetes management, however we have assisted patient with Libre 2 CGM.  His insulin was recently decreased to 30 units BID with meals (70/30) due to reported hypoglycemia.    Insurance coverage/medication affordability: [place]  Patient reports adherence with medications. . Current diabetes medications include: INSULIN 70/30, GLIPIZIDE . Current hypertension medications include: LOSARTAN (HASN'T RECEIVED VIA MAIL ORDER) Goal 130/80 . Current hyperlipidemia medications include: ATORVASTATIN   Patient reports hypoglycemic events. LIBRE 2 CGM TO AID IN ADJUSTMENTS   Patient reported dietary habits: Eats 2-3 meals/day Sees endocrine for adjustments and diet  Discussed meal planning options and Plate method for healthy eating . Avoid sugary drinks and desserts . Incorporate balanced protein, non starchy veggies, 1 serving of carbohydrate with each meal . Increase water intake . Increase physical activity as able  Patient-reported exercise habits: yardwork  O:  Lab Results  Component Value Date   HGBA1C 6.4 (A) 09/21/2019    Vitals:   10/10/19 1623  BP: (!) 147/88  Pulse: 60     Lipid Panel     Component Value Date/Time   CHOL 109 09/13/2019 1420   TRIG 126 09/13/2019 1420   HDL 33 (L) 09/13/2019 1420   CHOLHDL 3.3 09/13/2019 1420   LDLCALC 53 09/13/2019 1420    Home fasting blood sugars: n/a--libre 2   2 hour post-meal/random blood sugars: n/a.   A/P:  Diabetes T2DM currently controlled, however patient is experiencing lows. Patient is able to verbalize appropriate hypoglycemia management plan. Patient is adherent with medication.   -Patient received his Freestyle Libre 2 at no cost via [employer] (medical  device mail order). Device applied today   -Will be able to track patient lows at next visit.  Patient reports he is having lows, but insulin was just reduced by endocrine.  Will continue to follow  -Patient has not started losartan because mail order hasn't delivered this medication. New RX request sent.  Patient's BP was slightly elevated.  Encouraged patient to call [employer] customer service.  They state medication was delivered on 09/13/19  -Extensively discussed pathophysiology of diabetes, recommended lifestyle interventions, dietary effects on blood sugar control  -Counseled on s/sx of and management of hypoglycemia  -Next A1C anticipated 6 months--follows with endocrine/sees PCP next month.  Written patient instructions provided.  Total time in face to face counseling 20 minutes.   Follow up PCP Clinic Visit ON 11/02/19.   Regina Eck, PharmD, BCPS Clinical Pharmacist, [place]  [facility] Phone [phone]

## 2019-10-26 ENCOUNTER — Other Ambulatory Visit: Payer: Medicare Other

## 2019-10-26 ENCOUNTER — Other Ambulatory Visit: Payer: Self-pay

## 2019-10-26 DIAGNOSIS — R809 Proteinuria, unspecified: Secondary | ICD-10-CM | POA: Diagnosis not present

## 2019-10-26 DIAGNOSIS — I129 Hypertensive chronic kidney disease with stage 1 through stage 4 chronic kidney disease, or unspecified chronic kidney disease: Secondary | ICD-10-CM | POA: Diagnosis not present

## 2019-10-26 DIAGNOSIS — E1122 Type 2 diabetes mellitus with diabetic chronic kidney disease: Secondary | ICD-10-CM | POA: Diagnosis not present

## 2019-10-26 DIAGNOSIS — N189 Chronic kidney disease, unspecified: Secondary | ICD-10-CM | POA: Diagnosis not present

## 2019-10-26 DIAGNOSIS — E1129 Type 2 diabetes mellitus with other diabetic kidney complication: Secondary | ICD-10-CM | POA: Diagnosis not present

## 2019-10-30 ENCOUNTER — Other Ambulatory Visit: Payer: Self-pay | Admitting: Family Medicine

## 2019-11-02 ENCOUNTER — Ambulatory Visit (INDEPENDENT_AMBULATORY_CARE_PROVIDER_SITE_OTHER): Payer: Medicare Other | Admitting: Family Medicine

## 2019-11-02 ENCOUNTER — Encounter: Payer: Self-pay | Admitting: Family Medicine

## 2019-11-02 ENCOUNTER — Other Ambulatory Visit: Payer: Self-pay

## 2019-11-02 VITALS — BP 146/82 | HR 58 | Temp 97.5°F | Ht 69.0 in | Wt 219.2 lb

## 2019-11-02 DIAGNOSIS — E1122 Type 2 diabetes mellitus with diabetic chronic kidney disease: Secondary | ICD-10-CM

## 2019-11-02 DIAGNOSIS — E1159 Type 2 diabetes mellitus with other circulatory complications: Secondary | ICD-10-CM

## 2019-11-02 DIAGNOSIS — E1169 Type 2 diabetes mellitus with other specified complication: Secondary | ICD-10-CM

## 2019-11-02 DIAGNOSIS — E1121 Type 2 diabetes mellitus with diabetic nephropathy: Secondary | ICD-10-CM

## 2019-11-02 DIAGNOSIS — Z23 Encounter for immunization: Secondary | ICD-10-CM

## 2019-11-02 DIAGNOSIS — I10 Essential (primary) hypertension: Secondary | ICD-10-CM

## 2019-11-02 DIAGNOSIS — N1832 Chronic kidney disease, stage 3b: Secondary | ICD-10-CM | POA: Diagnosis not present

## 2019-11-02 DIAGNOSIS — E785 Hyperlipidemia, unspecified: Secondary | ICD-10-CM

## 2019-11-02 DIAGNOSIS — Z794 Long term (current) use of insulin: Secondary | ICD-10-CM

## 2019-11-02 MED ORDER — AMLODIPINE BESYLATE 5 MG PO TABS: 5.0000 mg | ORAL_TABLET | Freq: Every day | ORAL | 3 refills | Status: DC | PRN

## 2019-11-02 MED ORDER — AMLODIPINE BESYLATE 5 MG PO TABS: 5.0000 mg | ORAL_TABLET | Freq: Every day | ORAL | 3 refills | Status: DC

## 2019-11-02 NOTE — Progress Notes (Signed)
Subjective: CC: Follow-up type 2 diabetes, hypertension, hyperlipidemia PCP: Janora Norlander, DO DDU:KGURK Meister is a 64 y.o. male presenting to clinic today for:  1.Type 2 Diabetes with hypertension and hyperlipidemia:  Patient reports that he is loving the freestyle libre.  He is easily able to monitor his blood sugars.  They have been running essentially along the 200s with a couple of blood sugars that have gone below 60.  He injects his insulin after his evening blood sugar check.  He has a follow-up visit with his endocrinologist's PA soon.  He is compliant with the atorvastatin and losartan 50 mg daily, which was recently added back by his nephrologist.  Blood pressures continue to be elevated.  Last eye exam: Up-to-date Last foot exam: Up-to-date Last A1c:  Lab Results  Component Value Date   HGBA1C 6.4 (A) 09/21/2019   Nephropathy screen indicated?:  ARB Last flu, zoster and/or pneumovax:  Immunization History  Administered Date(s) Administered  . Fluad Quad(high Dose 65+) 11/02/2019  . Influenza,inj,Quad PF,6+ Mos 12/25/2016  . Pneumococcal Conjugate-13 12/27/2018  . Pneumococcal Polysaccharide-23 04/16/2017  . Tdap 08/19/2017  . Zoster Recombinat (Shingrix) 07/28/2019    ROS: No chest pain, shortness of breath, dizziness or falls    ROS: Per HPI  Allergies  Allergen Reactions  . Other Hives    Pt states that he is allergic to an ABT but he can not remember what it is   Past Medical History:  Diagnosis Date  . Diabetes ([place])   . Diabetic neuropathy ([place])   . Essential hypertension, benign 06/01/2019  . Hyperlipidemia     Current Outpatient Medications:  .  atorvastatin (LIPITOR) 40 MG tablet, TAKE 1 TABLET BY MOUTH EVERY DAY, Disp: 90 tablet, Rfl: 0 .  blood glucose meter kit and supplies, Dispense based on patient and insurance preference. Use up to four times daily as directed. (FOR ICD-10 E10.9, E11.9). Check blood sugar twice a day as directed.,  Disp: 1 each, Rfl: 0 .  Continuous Blood Gluc Receiver (FREESTYLE LIBRE 2 READER) DEVI, Use to test blood sugars up to 6 times daily. DX: E11.9, Disp: 1 each, Rfl: 0 .  Continuous Blood Gluc Sensor (FREESTYLE LIBRE 2 SENSOR) MISC, Use to test blood sugars up to 6 times daily. DX: E11.9, Disp: 6 each, Rfl: 4 .  gabapentin (NEURONTIN) 300 MG capsule, TAKE 1 CAPSULE BY MOUTH IN THE MORNING AND TAKE 3 CAPSULES BY MOUTH IN THE EVENING, Disp: 360 capsule, Rfl: 0 .  glipiZIDE (GLUCOTROL XL) 5 MG 24 hr tablet, TAKE 1 TABLET BY MOUTH EVERY DAY WITH BREAKFAST, Disp: 90 tablet, Rfl: 0 .  glucose blood (ACCU-CHEK GUIDE) test strip, Test blood sugar 4 times per day, before meals and at bedtime., Disp: 500 strip, Rfl: 3 .  insulin NPH-regular Human (70-30) 100 UNIT/ML injection, Inject 30 Units into the skin 2 (two) times daily before a meal., Disp: , Rfl:  .  Insulin Pen Needle (PEN NEEDLES) 31G X 8 MM MISC, 1 each by Does not apply route in the morning and at bedtime., Disp: 100 each, Rfl: 11 .  Insulin Syringe-Needle U-100 (INSULIN SYRINGE .3CC/31GX5/16") 31G X 5/16" 0.3 ML MISC, Use with insulin BID Dx E11.9, Disp: 200 each, Rfl: 3 .  losartan (COZAAR) 50 MG tablet, Take 1 tablet (50 mg total) by mouth daily., Disp: 90 tablet, Rfl: 0 Social History   Socioeconomic History  . Marital status: Single    Spouse name: Not on file  .  Number of children: 3  . Years of education: Not on file  . Highest education level: Not on file  Occupational History  . Occupation: retired  Tobacco Use  . Smoking status: Never Smoker  . Smokeless tobacco: Never Used  Vaping Use  . Vaping Use: Never used  Substance and Sexual Activity  . Alcohol use: Not Currently  . Drug use: Never  . Sexual activity: Not Currently  Other Topics Concern  . Not on file  Social History Narrative  . Not on file   Social Determinants of Health   Financial Resource Strain:   . Difficulty of Paying Living Expenses: Not on file  Food  Insecurity:   . Worried About Charity fundraiser in the Last Year: Not on file  . Ran Out of Food in the Last Year: Not on file  Transportation Needs:   . Lack of Transportation (Medical): Not on file  . Lack of Transportation (Non-Medical): Not on file  Physical Activity:   . Days of Exercise per Week: Not on file  . Minutes of Exercise per Session: Not on file  Stress:   . Feeling of Stress : Not on file  Social Connections:   . Frequency of Communication with Friends and Family: Not on file  . Frequency of Social Gatherings with Friends and Family: Not on file  . Attends Religious Services: Not on file  . Active Member of Clubs or Organizations: Not on file  . Attends Archivist Meetings: Not on file  . Marital Status: Not on file  Intimate Partner Violence:   . Fear of Current or Ex-Partner: Not on file  . Emotionally Abused: Not on file  . Physically Abused: Not on file  . Sexually Abused: Not on file   Family History  Problem Relation Age of Onset  . Stroke Mother   . Diabetes Mother   . Hypertension Mother   . Hyperlipidemia Mother   . Kidney disease Mother   . COPD Father   . Diabetes Sister   . Stroke Brother   . Diabetes Brother   . Arthritis Maternal Grandmother     Objective: Office vital signs reviewed. BP (!) 146/82   Pulse (!) 58   Temp (!) 97.5 F (36.4 C)   Ht 5' 9"  (1.753 m)   Wt 219 lb 3.2 oz (99.4 kg)   SpO2 97%   BMI 32.37 kg/m   Physical Examination:  General: Awake, alert, well nourished, No acute distress HEENT: Normal, sclera white, MMM Cardio: regular rate and rhythm, S1S2 heard, no murmurs appreciated Pulm: clear to auscultation bilaterally, no wheezes, rhonchi or rales; normal work of breathing on room air Extremities: warm, well perfused, No edema, cyanosis or clubbing; +2 pulses bilaterally  Assessment/ Plan: 64 y.o. male   1. Type 2 diabetes mellitus with stage 3b chronic kidney disease, with long-term current use  of insulin ([place]) Follow-up scheduled with nephrology soon.  2. Need for immunization against influenza Administered during today's visit - Flu Vaccine QUAD High Dose(Fluad)  3. Hyperlipidemia associated with type 2 diabetes mellitus ([place]) Continue statin  4. Hypertension associated with diabetes ([place]) Repeat under better control but really not at goal given CKD 3B.  Going to reach out to his nephrologist to inquire about increasing the losartan.  I have given him as needed Norvasc 5 mg to use for blood pressures over 150/90.  He is to continue monitoring his blood pressures closely   No orders of the  defined types were placed in this encounter.  No orders of the defined types were placed in this encounter.    Janora Norlander, DO [place] [phone]

## 2019-11-02 NOTE — Patient Instructions (Signed)
I'm going to add a low dose of Norvasc while I wait for Dr Theador Hawthorne to get back to me on your other medication.  Keep up with BPs.

## 2019-11-13 ENCOUNTER — Other Ambulatory Visit: Payer: Self-pay | Admitting: Family Medicine

## 2019-11-22 DIAGNOSIS — E113213 Type 2 diabetes mellitus with mild nonproliferative diabetic retinopathy with macular edema, bilateral: Secondary | ICD-10-CM | POA: Diagnosis not present

## 2019-11-22 LAB — HM DIABETES EYE EXAM

## 2019-11-28 ENCOUNTER — Ambulatory Visit: Payer: Medicare Other | Admitting: Family Medicine

## 2019-11-30 DIAGNOSIS — H35033 Hypertensive retinopathy, bilateral: Secondary | ICD-10-CM | POA: Diagnosis not present

## 2019-11-30 DIAGNOSIS — E113313 Type 2 diabetes mellitus with moderate nonproliferative diabetic retinopathy with macular edema, bilateral: Secondary | ICD-10-CM | POA: Diagnosis not present

## 2019-11-30 DIAGNOSIS — H43823 Vitreomacular adhesion, bilateral: Secondary | ICD-10-CM | POA: Diagnosis not present

## 2019-11-30 DIAGNOSIS — H35373 Puckering of macula, bilateral: Secondary | ICD-10-CM | POA: Diagnosis not present

## 2019-12-01 ENCOUNTER — Other Ambulatory Visit: Payer: Medicare Other

## 2019-12-04 ENCOUNTER — Other Ambulatory Visit: Payer: Self-pay | Admitting: Family Medicine

## 2019-12-05 ENCOUNTER — Other Ambulatory Visit: Payer: Medicare Other

## 2019-12-05 ENCOUNTER — Other Ambulatory Visit: Payer: Self-pay

## 2019-12-05 DIAGNOSIS — E1122 Type 2 diabetes mellitus with diabetic chronic kidney disease: Secondary | ICD-10-CM | POA: Diagnosis not present

## 2019-12-05 DIAGNOSIS — I129 Hypertensive chronic kidney disease with stage 1 through stage 4 chronic kidney disease, or unspecified chronic kidney disease: Secondary | ICD-10-CM | POA: Diagnosis not present

## 2019-12-05 DIAGNOSIS — R809 Proteinuria, unspecified: Secondary | ICD-10-CM | POA: Diagnosis not present

## 2019-12-05 DIAGNOSIS — N189 Chronic kidney disease, unspecified: Secondary | ICD-10-CM | POA: Diagnosis not present

## 2019-12-05 DIAGNOSIS — E1129 Type 2 diabetes mellitus with other diabetic kidney complication: Secondary | ICD-10-CM | POA: Diagnosis not present

## 2019-12-05 DIAGNOSIS — R7989 Other specified abnormal findings of blood chemistry: Secondary | ICD-10-CM | POA: Diagnosis not present

## 2019-12-06 LAB — THYROID PEROXIDASE ANTIBODY: Thyroperoxidase Ab SerPl-aCnc: <8 IU/mL (ref 0–34)

## 2019-12-06 LAB — T4, FREE: Free T4: 1 ng/dL (ref 0.82–1.77)

## 2019-12-06 LAB — THYROGLOBULIN ANTIBODY: Thyroglobulin Antibody: <1 IU/mL (ref 0.0–0.9)

## 2019-12-06 LAB — TSH: TSH: 6.57 u[IU]/mL — ABNORMAL HIGH (ref 0.450–4.500)

## 2019-12-07 DIAGNOSIS — N189 Chronic kidney disease, unspecified: Secondary | ICD-10-CM | POA: Diagnosis not present

## 2019-12-07 DIAGNOSIS — I129 Hypertensive chronic kidney disease with stage 1 through stage 4 chronic kidney disease, or unspecified chronic kidney disease: Secondary | ICD-10-CM | POA: Diagnosis not present

## 2019-12-07 DIAGNOSIS — E870 Hyperosmolality and hypernatremia: Secondary | ICD-10-CM | POA: Diagnosis not present

## 2019-12-07 DIAGNOSIS — D696 Thrombocytopenia, unspecified: Secondary | ICD-10-CM | POA: Diagnosis not present

## 2019-12-07 DIAGNOSIS — R809 Proteinuria, unspecified: Secondary | ICD-10-CM | POA: Diagnosis not present

## 2019-12-15 DIAGNOSIS — E113313 Type 2 diabetes mellitus with moderate nonproliferative diabetic retinopathy with macular edema, bilateral: Secondary | ICD-10-CM | POA: Diagnosis not present

## 2019-12-22 ENCOUNTER — Ambulatory Visit (INDEPENDENT_AMBULATORY_CARE_PROVIDER_SITE_OTHER): Payer: Medicare Other | Admitting: Nurse Practitioner

## 2019-12-22 ENCOUNTER — Other Ambulatory Visit: Payer: Self-pay

## 2019-12-22 ENCOUNTER — Encounter: Payer: Self-pay | Admitting: Nurse Practitioner

## 2019-12-22 ENCOUNTER — Encounter: Payer: Self-pay | Admitting: Nutrition

## 2019-12-22 ENCOUNTER — Encounter: Payer: Medicare Other | Attending: "Endocrinology | Admitting: Nutrition

## 2019-12-22 VITALS — BP 180/91 | HR 56 | Ht 69.0 in | Wt 216.0 lb

## 2019-12-22 VITALS — Ht 69.0 in | Wt 219.0 lb

## 2019-12-22 DIAGNOSIS — E1122 Type 2 diabetes mellitus with diabetic chronic kidney disease: Secondary | ICD-10-CM

## 2019-12-22 DIAGNOSIS — R7989 Other specified abnormal findings of blood chemistry: Secondary | ICD-10-CM

## 2019-12-22 DIAGNOSIS — Z794 Long term (current) use of insulin: Secondary | ICD-10-CM

## 2019-12-22 DIAGNOSIS — I10 Essential (primary) hypertension: Secondary | ICD-10-CM | POA: Diagnosis not present

## 2019-12-22 DIAGNOSIS — N183 Chronic kidney disease, stage 3 unspecified: Secondary | ICD-10-CM | POA: Diagnosis not present

## 2019-12-22 DIAGNOSIS — E782 Mixed hyperlipidemia: Secondary | ICD-10-CM | POA: Insufficient documentation

## 2019-12-22 DIAGNOSIS — N1832 Chronic kidney disease, stage 3b: Secondary | ICD-10-CM

## 2019-12-22 DIAGNOSIS — E039 Hypothyroidism, unspecified: Secondary | ICD-10-CM

## 2019-12-22 LAB — POCT GLYCOSYLATED HEMOGLOBIN (HGB A1C): Hemoglobin A1C: 6.8 % — AB (ref 4.0–5.6)

## 2019-12-22 MED ORDER — LEVOTHYROXINE SODIUM 25 MCG PO TABS
25.0000 ug | ORAL_TABLET | Freq: Every day | ORAL | 3 refills | Status: DC
Start: 2019-12-22 — End: 2020-08-20

## 2019-12-22 NOTE — Progress Notes (Signed)
°  Medical Nutrition Therapy:  Appt start time: 0930 end time:  1000.  Assessment:  Primary concerns today: Type 2 Dm. CKD Has a CMG now. Glucose levels are inconsistent. Tends to eat late breakfast and eating ice cream after supper. Diet is low in lower carb vegetables at times. Has gone to eye dr to get some injection due to swelling behind his eyes. A1C 6.8%. Currently on 30 units of 70/30 insulin BID; before breakfast and before dinner. Glipizide 5 mg a day. Wt up 3 lbs.  Lab Results  Component Value Date   HGBA1C 6.8 (A) 12/22/2019   CMP Latest Ref Rng & Units 09/13/2019 05/11/2019 04/14/2019  Glucose 65 - 99 mg/dL 206(H) 272(H) 461(HH)  BUN 8 - 27 mg/dL 28(H) 24(H) 29(H)  Creatinine 0.76 - 1.27 mg/dL 1.72(H) 1.71(H) 2.16(H)  Sodium 134 - 144 mmol/L 143 141 135  Potassium 3.5 - 5.2 mmol/L 4.9 5.0 4.8  Chloride 96 - 106 mmol/L 110(H) 106 100  CO2 20 - 29 mmol/L 22 24 18(L)  Calcium 8.6 - 10.2 mg/dL 8.6 9.1 9.5  Total Protein 6.0 - 8.5 g/dL 6.1 6.7 -  Total Bilirubin 0.0 - 1.2 mg/dL 0.5 0.8 -  Alkaline Phos 48 - 121 IU/L 76 72 -  AST 0 - 40 IU/L 13 17 -  ALT 0 - 44 IU/L 16 21 -   Lipid Panel     Component Value Date/Time   CHOL 109 09/13/2019 1420   TRIG 126 09/13/2019 1420   HDL 33 (L) 09/13/2019 1420   CHOLHDL 3.3 09/13/2019 1420   LDLCALC 53 09/13/2019 1420   LABVLDL 23 09/13/2019 1420   .  Usual physical activity:ADKL   B)  Gravy bisuits, peaches and sausage patty or eggs, sausage,  1 pancake and peaches L) Tomato sandwich, water Dinner: Liver, peas/dumplings,   PB sandwich, water   Estimated energy needs: 1800  calories 200 g carbohydrates 135 g protein 50 g fat  Progress Towards Goal(s):  In progress.   Nutritional Diagnosis:  NB-1.1 Food and nutrition-related knowledge deficit As related to Diabetes Type 2.  As evidenced by A1C 9%.    Intervention:  Nutrition and Diabetes education provided on My Plate, CHO counting, meal planning, portion sizes, timing of  meals, avoiding snacks between meals unless having a low blood sugar, target ranges for A1C and blood sugars, signs/symptoms and treatment of hyper/hypoglycemia, monitoring blood sugars, taking medications as prescribed, benefits of exercising 30 minutes per day and prevention of complications of DM.  [place] Kitchen Goals  Goals  Cut out ice cream after supper. Eat breakfast by 830 am. Got bedtime by midnight. Increase lower carb vegetables. Keep walking about 30 minutes  Teaching Method Utilized:  Visual Auditory Hands on  Handouts given during visit include:  The Plate Method   Diabetes Instructions.   Barriers to learning/adherence to lifestyle change:   Demonstrated degree of understanding via:  Teach Back   Monitoring/Evaluation:  Dietary intake, exercise, and body weight in 3 month(s).

## 2019-12-22 NOTE — Patient Instructions (Addendum)
- The correct intake of thyroid hormone (Levothyroxine, Synthroid), is on empty stomach first thing in the morning, with water, separated by at least 30 minutes from breakfast and other medications,  and separated by more than 4 hours from calcium, iron, multivitamins, acid reflux medications (PPIs).  - This medication is a life-long medication and will be needed to correct thyroid hormone imbalances for the rest of your life.  The dose may change from time to time, based on thyroid blood work.  - It is extremely important to be consistent taking this medication, near the same time each morning.  -AVOID TAKING PRODUCTS CONTAINING BIOTIN (commonly found in Hair, Skin, Nails vitamins) AS IT INTERFERES WITH THE VALIDITY OF THYROID FUNCTION BLOOD TESTS.    Hypothyroidism  Hypothyroidism is when the thyroid gland does not make enough of certain hormones (it is underactive). The thyroid gland is a small gland located in the lower front part of the neck, just in front of the windpipe (trachea). This gland makes hormones that help control how the body uses food for energy (metabolism) as well as how the heart and brain function. These hormones also play a role in keeping your bones strong. When the thyroid is underactive, it produces too little of the hormones thyroxine (T4) and triiodothyronine (T3). What are the causes? This condition may be caused by:  Hashimoto's disease. This is a disease in which the body's disease-fighting system (immune system) attacks the thyroid gland. This is the most common cause.  Viral infections.  Pregnancy.  Certain medicines.  Birth defects.  Past radiation treatments to the head or neck for cancer.  Past treatment with radioactive iodine.  Past exposure to radiation in the environment.  Past surgical removal of part or all of the thyroid.  Problems with a gland in the center of the brain (pituitary gland).  Lack of enough iodine in the diet. What  increases the risk? You are more likely to develop this condition if:  You are male.  You have a family history of thyroid conditions.  You use a medicine called lithium.  You take medicines that affect the immune system (immunosuppressants). What are the signs or symptoms? Symptoms of this condition include:  Feeling as though you have no energy (lethargy).  Not being able to tolerate cold.  Weight gain that is not explained by a change in diet or exercise habits.  Lack of appetite.  Dry skin.  Coarse hair.  Menstrual irregularity.  Slowing of thought processes.  Constipation.  Sadness or depression. How is this diagnosed? This condition may be diagnosed based on:  Your symptoms, your medical history, and a physical exam.  Blood tests. You may also have imaging tests, such as an ultrasound or MRI. How is this treated? This condition is treated with medicine that replaces the thyroid hormones that your body does not make. After you begin treatment, it may take several weeks for symptoms to go away. Follow these instructions at home:  Take over-the-counter and prescription medicines only as told by your health care provider.  If you start taking any new medicines, tell your health care provider.  Keep all follow-up visits as told by your health care provider. This is important. ? As your condition improves, your dosage of thyroid hormone medicine may change. ? You will need to have blood tests regularly so that your health care provider can monitor your condition. Contact a health care provider if:  Your symptoms do not get better with  treatment.  You are taking thyroid replacement medicine and you: ? Sweat a lot. ? Have tremors. ? Feel anxious. ? Lose weight rapidly. ? Cannot tolerate heat. ? Have emotional swings. ? Have diarrhea. ? Feel weak. Get help right away if you have:  Chest pain.  An irregular heartbeat.  A rapid  heartbeat.  Difficulty breathing. Summary  Hypothyroidism is when the thyroid gland does not make enough of certain hormones (it is underactive).  When the thyroid is underactive, it produces too little of the hormones thyroxine (T4) and triiodothyronine (T3).  The most common cause is Hashimoto's disease, a disease in which the body's disease-fighting system (immune system) attacks the thyroid gland. The condition can also be caused by viral infections, medicine, pregnancy, or past radiation treatment to the head or neck.  Symptoms may include weight gain, dry skin, constipation, feeling as though you do not have energy, and not being able to tolerate cold.  This condition is treated with medicine to replace the thyroid hormones that your body does not make. This information is not intended to replace advice given to you by your health care provider. Make sure you discuss any questions you have with your health care provider. Document Revised: 01/09/2017 Document Reviewed: 01/07/2017 Elsevier Patient Education  2020 [employer].

## 2019-12-22 NOTE — Progress Notes (Signed)
12/22/2019, 11:29 AM  Endocrinology follow-up note   Subjective:    Patient ID: Anthony French, male    DOB: 26-Jan-1956.  Anthony French is being seen in follow-up after he was seen in consultation for management of currently uncontrolled symptomatic diabetes requested by  Janora Norlander, DO.   Past Medical History:  Diagnosis Date   Diabetes ([place])    Diabetic neuropathy ([place])    Essential hypertension, benign 06/01/2019   Hyperlipidemia     Past Surgical History:  Procedure Laterality Date   APPENDECTOMY  1980   CYST REMOVAL TRUNK      Social History   Socioeconomic History   Marital status: Single    Spouse name: Not on file   Number of children: 3   Years of education: Not on file   Highest education level: Not on file  Occupational History   Occupation: retired  Tobacco Use   Smoking status: Never Smoker   Smokeless tobacco: Never Used  Scientific laboratory technician Use: Never used  Substance and Sexual Activity   Alcohol use: Not Currently   Drug use: Never   Sexual activity: Not Currently  Other Topics Concern   Not on file  Social History Narrative   Not on file   Social Determinants of Health   Financial Resource Strain:    Difficulty of Paying Living Expenses: Not on file  Food Insecurity:    Worried About Charity fundraiser in the Last Year: Not on file   [place] in the Last Year: Not on file  Transportation Needs:    Lack of Transportation (Medical): Not on file   Lack of Transportation (Non-Medical): Not on file  Physical Activity:    Days of Exercise per Week: Not on file   Minutes of Exercise per Session: Not on file  Stress:    Feeling of Stress : Not on file  Social Connections:    Frequency of Communication with Friends and Family: Not on file   Frequency of Social Gatherings with Friends and Family: Not on file   Attends Religious Services: Not on file   Active Member of  Clubs or Organizations: Not on file   Attends Archivist Meetings: Not on file   Marital Status: Not on file    Family History  Problem Relation Age of Onset   Stroke Mother    Diabetes Mother    Hypertension Mother    Hyperlipidemia Mother    Kidney disease Mother    COPD Father    Diabetes Sister    Stroke Brother    Diabetes Brother    Arthritis Maternal Grandmother     Outpatient Encounter Medications as of 12/22/2019  Medication Sig   amLODipine (NORVASC) 5 MG tablet Take 1 tablet (5 mg total) by mouth daily as needed (blood pressure above 150/90.).   atorvastatin (LIPITOR) 40 MG tablet TAKE 1 TABLET BY MOUTH  DAILY   blood glucose meter kit and supplies Dispense based on patient and insurance preference. Use up to four times daily as directed. (FOR ICD-10 E10.9, E11.9). Check blood sugar twice a day as directed.   Continuous Blood Gluc Receiver (FREESTYLE LIBRE 2 READER) DEVI Use to test blood sugars up to 6 times daily. DX: E11.9   Continuous Blood Gluc Sensor (FREESTYLE LIBRE 2 SENSOR) MISC Use to test blood sugars up to 6 times daily. DX: E11.9   gabapentin (NEURONTIN) 300 MG  capsule TAKE 1 CAPSULE BY MOUTH IN  THE MORNING AND TAKE 3  CAPSULES BY MOUTH IN THE  EVENING   glipiZIDE (GLUCOTROL XL) 5 MG 24 hr tablet TAKE 1 TABLET BY MOUTH  DAILY WITH BREAKFAST   glucose blood (ACCU-CHEK GUIDE) test strip Test blood sugar 4 times per day, before meals and at bedtime.   insulin NPH-regular Human (70-30) 100 UNIT/ML injection Inject 30 Units into the skin 2 (two) times daily before a meal.   Insulin Pen Needle (PEN NEEDLES) 31G X 8 MM MISC 1 each by Does not apply route in the morning and at bedtime.   Insulin Syringe-Needle U-100 (INSULIN SYRINGE .3CC/31GX5/16") 31G X 5/16" 0.3 ML MISC Use with insulin BID Dx E11.9   losartan (COZAAR) 50 MG tablet Take 1 tablet (50 mg total) by mouth daily.   levothyroxine (SYNTHROID) 25 MCG tablet Take 1 tablet  (25 mcg total) by mouth daily before breakfast.   No facility-administered encounter medications on file as of 12/22/2019.    ALLERGIES: Allergies  Allergen Reactions   Other Hives    Pt states that he is allergic to an ABT but he can not remember what it is    VACCINATION STATUS: Immunization History  Administered Date(s) Administered   Fluad Quad(high Dose 65+) 11/02/2019   Influenza,inj,Quad PF,6+ Mos 12/25/2016   Pneumococcal Conjugate-13 12/27/2018   Pneumococcal Polysaccharide-23 04/16/2017   Tdap 08/19/2017   Zoster Recombinat (Shingrix) 07/28/2019    Diabetes He presents for his follow-up diabetic visit. He has type 2 diabetes mellitus. Onset time: He was diagnosed at approximate age of 64 years. His disease course has been stable. Hypoglycemia symptoms include sweats. Pertinent negatives for hypoglycemia include no headaches, nervousness/anxiousness, pallor or tremors. Associated symptoms include fatigue. Pertinent negatives for diabetes include no chest pain, no polydipsia, no polyphagia and no polyuria. There are no hypoglycemic complications. Symptoms are stable. Diabetic complications include nephropathy, peripheral neuropathy and retinopathy. Risk factors for coronary artery disease include dyslipidemia, diabetes mellitus, hypertension, male sex, sedentary lifestyle and family history. Current diabetic treatment includes insulin injections and oral agent (monotherapy). He is compliant with treatment most of the time. His weight is fluctuating minimally. He is following a generally unhealthy diet. When asked about meal planning, he reported none. He has had a previous visit with a dietitian. He never participates in exercise. His home blood glucose trend is fluctuating dramatically. His breakfast blood glucose range is generally 180-200 mg/dl. His lunch blood glucose range is generally 180-200 mg/dl. His dinner blood glucose range is generally 90-110 mg/dl. His bedtime  blood glucose range is generally >200 mg/dl. (He presents today with his CGM and logs showing widely fluctuating glycemic profile.  His POCT A1c today is 6.8%, slightly worse than last visit of 6.4%.  His fasting glucose typically runs high, then he drops into the 80s in the late afternoon.  This is probably due to his meal timing.  He eats breakfast around [phone] and lunch around 12-1 pm.  ) An ACE inhibitor/angiotensin II receptor blocker is being taken. He does not see a podiatrist.Eye exam is current.  Hyperlipidemia This is a chronic problem. The current episode started more than 1 year ago. The problem is controlled. Recent lipid tests were reviewed and are normal. Exacerbating diseases include chronic renal disease, diabetes and hypothyroidism. There are no known factors aggravating his hyperlipidemia. Pertinent negatives include no chest pain, myalgias or shortness of breath. Current antihyperlipidemic treatment includes statins. The current treatment provides significant improvement  of lipids. There are no compliance problems.  Risk factors for coronary artery disease include diabetes mellitus, dyslipidemia, hypertension, male sex and a sedentary lifestyle.  Hypertension This is a chronic problem. The current episode started more than 1 year ago. The problem has been gradually improving since onset. The problem is uncontrolled. Associated symptoms include sweats. Pertinent negatives include no chest pain, headaches, palpitations or shortness of breath. There are no associated agents to hypertension. Risk factors for coronary artery disease include dyslipidemia, diabetes mellitus, family history, male gender and sedentary lifestyle. Past treatments include angiotensin blockers and calcium channel blockers. The current treatment provides mild improvement. There are no compliance problems.  Hypertensive end-organ damage includes kidney disease and retinopathy. Identifiable causes of hypertension include  chronic renal disease and a thyroid problem.  Thyroid Problem Presents for follow-up (Abnormality noted with recent thyroid studies.  No prior work-up done, no previous labs to compare to.) visit. Onset time: Unknown. Symptoms include fatigue. Patient reports no anxiety, cold intolerance, constipation, diarrhea, heat intolerance, palpitations or tremors. The symptoms have been stable. Past treatments include nothing. His past medical history is significant for diabetes and hyperlipidemia. There are no known risk factors.    Review of systems  Constitutional: + Minimally fluctuating body weight,  current Body mass index is 31.9 kg/m. , + fatigue, no subjective hyperthermia, no subjective hypothermia Eyes: no blurry vision, no xerophthalmia ENT: no sore throat, no nodules palpated in throat, no dysphagia/odynophagia, no hoarseness Cardiovascular: no chest pain, no shortness of breath, no palpitations, no leg swelling Respiratory: no cough, no shortness of breath Gastrointestinal: no nausea/vomiting/diarrhea Musculoskeletal: no muscle/joint aches Skin: no rashes, no hyperemia Neurological: no tremors, no numbness, no tingling, no dizziness Psychiatric: no depression, no anxiety   Objective:    BP (!) 180/91 (BP Location: Right Arm, Patient Position: Sitting)    Pulse (!) 56    Ht 5' 9"  (1.753 m)    Wt 216 lb (98 kg)    BMI 31.90 kg/m   Wt Readings from Last 3 Encounters:  12/22/19 219 lb (99.3 kg)  12/22/19 216 lb (98 kg)  11/02/19 219 lb 3.2 oz (99.4 kg)     BP Readings from Last 3 Encounters:  12/22/19 (!) 180/91  11/02/19 (!) 146/82  10/10/19 (!) 147/88     Physical Exam- Limited  Constitutional:  Body mass index is 31.9 kg/m. , not in acute distress, normal state of mind Eyes:  EOMI, no exophthalmos Neck: Supple Thyroid: No gross goiter, no palpable nodules Cardiovascular: RRR, no murmers, rubs, or gallops, no edema Respiratory: Adequate breathing efforts, no crackles,  rales, rhonchi, or wheezing Musculoskeletal: no gross deformities, strength intact in all four extremities, no gross restriction of joint movements Skin:  no rashes, no hyperemia Neurological: no tremor with outstretched hands   CMP ( most recent) CMP     Component Value Date/Time   NA 143 09/13/2019 1420   K 4.9 09/13/2019 1420   CL 110 (H) 09/13/2019 1420   CO2 22 09/13/2019 1420   GLUCOSE 206 (H) 09/13/2019 1420   GLUCOSE 272 (H) 05/11/2019 1254   BUN 28 (H) 09/13/2019 1420   CREATININE 1.72 (H) 09/13/2019 1420   CALCIUM 8.6 09/13/2019 1420   PROT 6.1 09/13/2019 1420   ALBUMIN 4.0 09/13/2019 1420   AST 13 09/13/2019 1420   ALT 16 09/13/2019 1420   ALKPHOS 76 09/13/2019 1420   BILITOT 0.5 09/13/2019 1420   GFRNONAA 41 (L) 09/13/2019 1420   GFRAA 47 (L)  09/13/2019 1420    Diabetic Labs (most recent): Lab Results  Component Value Date   HGBA1C 6.8 (A) 12/22/2019   HGBA1C 6.4 (A) 09/21/2019   HGBA1C 9.0 05/05/2019     Lipid Panel ( most recent) Lipid Panel     Component Value Date/Time   CHOL 109 09/13/2019 1420   TRIG 126 09/13/2019 1420   HDL 33 (L) 09/13/2019 1420   CHOLHDL 3.3 09/13/2019 1420   LDLCALC 53 09/13/2019 1420   LABVLDL 23 09/13/2019 1420     Assessment & Plan:   1. Type 2 diabetes mellitus with stage 3b chronic kidney disease, with long-term current use of insulin (HCC)   - Nylen Creque has currently uncontrolled symptomatic type 2 DM since  64 years of age.   Recent labs reviewed.  He presents today with his CGM and logs showing widely fluctuating glycemic profile.  His POCT A1c today is 6.8%, slightly worse than last visit of 6.4%.  His fasting glucose typically runs high, then he drops into the 80s in the late afternoon.  This is probably due to his meal timing.  He eats breakfast around [phone] and lunch around 12-1 pm.    - I had a long discussion with him about the progressive nature of diabetes and the pathology behind its  complications. -his diabetes is complicated by CKD, peripheral neuropathy and he remains at a high risk for more acute and chronic complications which include CAD, CVA, CKD, retinopathy, and neuropathy. These are all discussed in detail with him.  - Nutritional counseling repeated at each appointment due to patients tendency to fall back in to old habits.  - The patient admits there is a room for improvement in their diet and drink choices. -  Suggestion is made for the patient to avoid simple carbohydrates from their diet including Cakes, Sweet Desserts / Pastries, Ice Cream, Soda (diet and regular), Sweet Tea, Candies, Chips, Cookies, Sweet Pastries,  Store Bought Juices, Alcohol in Excess of  1-2 drinks a day, Artificial Sweeteners, Coffee Creamer, and "Sugar-free" Products. This will help patient to have stable blood glucose profile and potentially avoid unintended weight gain.   - I encouraged the patient to switch to  unprocessed or minimally processed complex starch and increased protein intake (animal or plant source), fruits, and vegetables.   - Patient is advised to stick to a routine mealtimes to eat 3 meals  a day and avoid unnecessary snacks ( to snack only to correct hypoglycemia).  - he is scheduled with Jearld Fenton, RDN, CDE for diabetes education after his appointment with me today.  - I have approached him with the following individualized plan to manage  his diabetes and patient agrees:   - he will continue to benefit from simplicity of premixed insulin.   Given his frequent postprandial hypoglycemia, he is advised to adjust his meal timing.  He is advised to continue Novolin 70/30 to 30 units with breakfast and 30 units with supper, when glucose readings above 90 and he is eating. He is benefiting from, and advised to continue, Glipizide 5 mg XL daily with breakfast.  -He is encouraged to continue using his CGM to monitor blood glucose 4 times per day, before meals and  before bed, and call the clinic if he has readings less than 70 or greater than 300 for 3 tests in a row.  - Specific targets for  A1c;  LDL, HDL,  and Triglycerides were discussed with the patient.  2) Blood  Pressure /Hypertension:  His blood pressure is not controlled to target.  He has not yet taken his medications this morning.  He has a follow up appointment with his nephrologist next week.  I encouraged patient to discuss elevated BP readings with them, and allow them to make any necessary adjustments.  He is advised to continue Cozaar 50 mg po daily as well as Amlodipine 5 mg po daily (recently added by PCP).  3) Lipids/Hyperlipidemia:  His most recent lipid panel from 09/13/19 shows controlled LDL of 53.  He is advised to continue Atorvastatin 40 mg po daily at bedtime.  Side effects and precautions discussed with him.  4)  Weight/Diet:  His Body mass index is 31.9 kg/m.     he is  a candidate for some weight loss. I discussed with him the fact that loss of 5 - 10% of his  current body weight will have the most impact on his diabetes management.  Exercise, and detailed carbohydrates information provided  -  detailed on discharge instructions.  5) Elevated TSH- Hypothyroidism: His most recent thyroid function tests show worsening TSH of 6.5 and FT4 of 1.  His TPO antibodies were slightly elevated at 8 (indicating Hashimoto's thyroiditis).  He will benefit from initiation of low dose Levothyroxine at 25 mcg po daily before breakfast.  Will recheck TFTs prior to next visit and adjust dose if necessary.  - The correct intake of thyroid hormone (Levothyroxine, Synthroid), is on empty stomach first thing in the morning, with water, separated by at least 30 minutes from breakfast and other medications,  and separated by more than 4 hours from calcium, iron, multivitamins, acid reflux medications (PPIs).  - This medication is a life-long medication and will be needed to correct thyroid hormone  imbalances for the rest of your life.  The dose may change from time to time, based on thyroid blood work.  - It is extremely important to be consistent taking this medication, near the same time each morning.  -AVOID TAKING PRODUCTS CONTAINING BIOTIN (commonly found in Hair, Skin, Nails vitamins) AS IT INTERFERES WITH THE VALIDITY OF THYROID FUNCTION BLOOD TESTS.  6) Chronic Care/Health Maintenance: -he  is on  ARB and Statin medications and  is encouraged to initiate and continue to follow up with Ophthalmology, Dentist,  Podiatrist at least yearly or according to recommendations, and advised to   stay away from smoking. I have recommended yearly flu vaccine and pneumonia vaccine at least every 5 years; moderate intensity exercise for up to 150 minutes weekly; and  sleep for at least 7 hours a day.  - he is  advised to maintain close follow up with Janora Norlander, DO for primary care needs, as well as his other providers for optimal and coordinated care.   - Time spent on this patient care encounter:  35 min, of which > 50% was spent in  counseling and the rest reviewing his blood glucose logs , discussing his hypoglycemia and hyperglycemia episodes, reviewing his current and  previous labs / studies  ( including abstraction from other facilities) and medications  doses and developing a  long term treatment plan and documenting his care.   Please refer to Patient Instructions for Blood Glucose Monitoring and Insulin/Medications Dosing Guide"  in media tab for additional information. Please  also refer to " Patient Self Inventory" in the Media  tab for reviewed elements of pertinent patient history.  [name] participated in the discussions, expressed understanding, and  voiced agreement with the above plans.  All questions were answered to his satisfaction. he is encouraged to contact clinic should he have any questions or concerns prior to his return visit.  Follow up plan: - Return in  about 4 months (around 04/20/2020) for Diabetes follow up with A1c in office, Thyroid follow up, Previsit labs.  Rayetta Pigg, [facility] [address] [place], [place] [address] Phone: [phone] Fax: [phone]   12/22/2019, 11:29 AM

## 2019-12-22 NOTE — Patient Instructions (Addendum)
Goals  Cut out ice cream after supper. Eat breakfast by 830 am. Got bedtime by midnight. Increase lower carb vegetables. Keep walking about 30 minutes

## 2020-01-04 DIAGNOSIS — E119 Type 2 diabetes mellitus without complications: Secondary | ICD-10-CM | POA: Diagnosis not present

## 2020-01-04 DIAGNOSIS — Z794 Long term (current) use of insulin: Secondary | ICD-10-CM | POA: Diagnosis not present

## 2020-01-10 ENCOUNTER — Encounter: Payer: Self-pay | Admitting: Nutrition

## 2020-01-15 ENCOUNTER — Other Ambulatory Visit: Payer: Self-pay | Admitting: Family Medicine

## 2020-01-18 DIAGNOSIS — E113313 Type 2 diabetes mellitus with moderate nonproliferative diabetic retinopathy with macular edema, bilateral: Secondary | ICD-10-CM | POA: Diagnosis not present

## 2020-02-07 ENCOUNTER — Ambulatory Visit (INDEPENDENT_AMBULATORY_CARE_PROVIDER_SITE_OTHER): Payer: Medicare Other | Admitting: Family Medicine

## 2020-02-07 ENCOUNTER — Other Ambulatory Visit: Payer: Self-pay

## 2020-02-07 VITALS — BP 160/84 | HR 98 | Temp 97.7°F | Ht 69.0 in | Wt 215.0 lb

## 2020-02-07 DIAGNOSIS — Z0000 Encounter for general adult medical examination without abnormal findings: Secondary | ICD-10-CM

## 2020-02-07 DIAGNOSIS — Z0001 Encounter for general adult medical examination with abnormal findings: Secondary | ICD-10-CM | POA: Diagnosis not present

## 2020-02-07 DIAGNOSIS — R809 Proteinuria, unspecified: Secondary | ICD-10-CM | POA: Diagnosis not present

## 2020-02-07 DIAGNOSIS — N1832 Chronic kidney disease, stage 3b: Secondary | ICD-10-CM

## 2020-02-07 DIAGNOSIS — E785 Hyperlipidemia, unspecified: Secondary | ICD-10-CM

## 2020-02-07 DIAGNOSIS — R351 Nocturia: Secondary | ICD-10-CM

## 2020-02-07 DIAGNOSIS — I129 Hypertensive chronic kidney disease with stage 1 through stage 4 chronic kidney disease, or unspecified chronic kidney disease: Secondary | ICD-10-CM | POA: Diagnosis not present

## 2020-02-07 DIAGNOSIS — I152 Hypertension secondary to endocrine disorders: Secondary | ICD-10-CM | POA: Diagnosis not present

## 2020-02-07 DIAGNOSIS — B351 Tinea unguium: Secondary | ICD-10-CM

## 2020-02-07 DIAGNOSIS — E1129 Type 2 diabetes mellitus with other diabetic kidney complication: Secondary | ICD-10-CM | POA: Diagnosis not present

## 2020-02-07 DIAGNOSIS — N189 Chronic kidney disease, unspecified: Secondary | ICD-10-CM | POA: Diagnosis not present

## 2020-02-07 DIAGNOSIS — E1142 Type 2 diabetes mellitus with diabetic polyneuropathy: Secondary | ICD-10-CM

## 2020-02-07 DIAGNOSIS — E1159 Type 2 diabetes mellitus with other circulatory complications: Secondary | ICD-10-CM

## 2020-02-07 DIAGNOSIS — E1122 Type 2 diabetes mellitus with diabetic chronic kidney disease: Secondary | ICD-10-CM | POA: Diagnosis not present

## 2020-02-07 DIAGNOSIS — R7989 Other specified abnormal findings of blood chemistry: Secondary | ICD-10-CM

## 2020-02-07 DIAGNOSIS — E1169 Type 2 diabetes mellitus with other specified complication: Secondary | ICD-10-CM

## 2020-02-07 DIAGNOSIS — Z794 Long term (current) use of insulin: Secondary | ICD-10-CM

## 2020-02-07 MED ORDER — LOSARTAN POTASSIUM 50 MG PO TABS: 50.0000 mg | ORAL_TABLET | Freq: Every day | ORAL | 0 refills | Status: DC

## 2020-02-07 MED ORDER — TERBINAFINE HCL 250 MG PO TABS: 250.0000 mg | ORAL_TABLET | Freq: Every day | ORAL | 0 refills | Status: DC

## 2020-02-07 MED ORDER — LOSARTAN POTASSIUM 50 MG PO TABS
50.0000 mg | ORAL_TABLET | Freq: Every day | ORAL | 3 refills | Status: DC
Start: 2020-02-07 — End: 2020-12-24

## 2020-02-07 NOTE — Patient Instructions (Signed)
You had labs performed today.  You will be contacted with the results of the labs once they are available, usually in the next 3 business days for routine lab work.  If you have an active my chart account, they will be released to your MyChart.  If you prefer to have these labs released to you via telephone, please let us know.  If you had a pap smear or biopsy performed, expect to be contacted in about 7-10 days.  Lamisil ordered for your toenails.  Fungal Nail Infection A fungal nail infection is a common infection of the toenails or fingernails. This condition affects toenails more often than fingernails. It often affects the great, or big, toes. More than one nail may be infected. The condition can be passed from person to person (is contagious). What are the causes? This condition is caused by a fungus. Several types of fungi can cause the infection. These fungi are common in moist and warm areas. If your hands or feet come into contact with the fungus, it may get into a crack in your fingernail or toenail and cause the infection. What increases the risk? The following factors may make you more likely to develop this condition:  Being male.  Being of older age.  Living with someone who has the fungus.  Walking barefoot in areas where the fungus thrives, such as showers or locker rooms.  Wearing shoes and socks that cause your feet to sweat.  Having a nail injury or a recent nail surgery.  Having certain medical conditions, such as: ? Athlete's foot. ? Diabetes. ? Psoriasis. ? Poor circulation. ? A weak body defense system (immune system). What are the signs or symptoms? Symptoms of this condition include:  A pale spot on the nail.  Thickening of the nail.  A nail that becomes yellow or brown.  A brittle or ragged nail edge.  A crumbling nail.  A nail that has lifted away from the nail bed. How is this diagnosed? This condition is diagnosed with a physical exam. Your  health care provider may take a scraping or clipping from your nail to test for the fungus. How is this treated? Treatment is not needed for mild infections. If you have significant nail changes, treatment may include:  Antifungal medicines taken by mouth (orally). You may need to take the medicine for several weeks or several months, and you may not see the results for a long time. These medicines can cause side effects. Ask your health care provider what problems to watch for.  Antifungal nail polish or nail cream. These may be used along with oral antifungal medicines.  Laser treatment of the nail.  Surgery to remove the nail. This may be needed for the most severe infections. It can take a long time, usually up to a year, for the infection to go away. The infection may also come back. Follow these instructions at home: Medicines  Take or apply over-the-counter and prescription medicines only as told by your health care provider.  Ask your health care provider about using over-the-counter mentholated ointment on your nails. Nail care  Trim your nails often.  Wash and dry your hands and feet every day.  Keep your feet dry: ? Wear absorbent socks, and change your socks frequently. ? Wear shoes that allow air to circulate, such as sandals or canvas tennis shoes. Throw out old shoes.  Do not use artificial nails.  If you go to a nail salon, make sure you  choose one that uses clean instruments.  Use antifungal foot powder on your feet and in your shoes. General instructions  Do not share personal items, such as towels or nail clippers.  Do not walk barefoot in shower rooms or locker rooms.  Wear rubber gloves if you are working with your hands in wet areas.  Keep all follow-up visits as told by your health care provider. This is important. Contact a health care provider if: Your infection is not getting better or it is getting worse after several months. Summary  A fungal  nail infection is a common infection of the toenails or fingernails.  Treatment is not needed for mild infections. If you have significant nail changes, treatment may include taking medicine orally and applying medicine to your nails.  It can take a long time, usually up to a year, for the infection to go away. The infection may also come back.  Take or apply over-the-counter and prescription medicines only as told by your health care provider.  Follow instructions for taking care of your nails to help prevent infection from coming back or spreading. This information is not intended to replace advice given to you by your health care provider. Make sure you discuss any questions you have with your health care provider. Document Revised: 05/20/2018 Document Reviewed: 07/03/2017 Elsevier Patient Education  [place].

## 2020-02-07 NOTE — Progress Notes (Signed)
Anthony French is a 64 y.o. male presents to office today for annual physical exam examination.    Concerns today include: 1.  Hypertension associated with diabetes Patient reports compliance with Norvasc 5 mg daily.  He is been out of his losartan and is asking to have this renewed.  Apparently did not receive this from his mail order after it was ordered on the sixth of the month.  He had a good checkup with his endocrinologist recently and A1c was down to 6.8.  He has been continued on his medications is currently injecting NPH-regular 70/30 insulin 30 units twice daily and continues Glucotrol XL 5 mg daily.  No chest pain, shortness of breath, edema.  He continues to follow-up with renal.  He admits to nocturia.  He has been followed by [facility] for eye injections.  He does feel that his vision is better.  2.  Nail fungus Patient reports discoloration of his nails.  He would like to go on a medication to help with this.  His left side seems to be especially affected.  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 Fluad Quad(high Dose 65+) 11/02/2019  . Influenza,inj,Quad PF,6+ Mos 12/25/2016  . Pneumococcal Conjugate-13 12/27/2018  . Pneumococcal Polysaccharide-23 04/16/2017  . Tdap 08/19/2017  . Zoster Recombinat (Shingrix) 07/28/2019   Past Medical History:  Diagnosis Date  . Diabetes ([place])   . Diabetic neuropathy ([place])   . Essential hypertension, benign 06/01/2019  . Hyperlipidemia    Social History   Socioeconomic History  . Marital status: Single    Spouse name: Not on file  . Number of children: 3  . Years of education: Not on file  . Highest education level: Not on file  Occupational History  . Occupation: retired  Tobacco Use   . Smoking status: Never Smoker  . Smokeless tobacco: Never Used  Vaping Use  . Vaping Use: Never used  Substance and Sexual Activity  . Alcohol use: Not Currently  . Drug use: Never  . Sexual activity: Not Currently  Other Topics Concern  . Not on file  Social History Narrative  . Not on file   Social Determinants of Health   Financial Resource Strain: Not on file  Food Insecurity: Not on file  Transportation Needs: Not on file  Physical Activity: Not on file  Stress: Not on file  Social Connections: Not on file  Intimate Partner Violence: Not on file   Past Surgical History:  Procedure Laterality Date  . APPENDECTOMY  1980  . CYST REMOVAL TRUNK     Family History  Problem Relation Age of Onset  . Stroke Mother   . Diabetes Mother   . Hypertension Mother   . Hyperlipidemia Mother   . Kidney disease Mother   . COPD Father   . Diabetes Sister   . Stroke Brother   . Diabetes Brother   . Arthritis Maternal Grandmother     Current Outpatient Medications:  .  amLODipine (NORVASC) 5 MG tablet, Take 1 tablet (5 mg total) by mouth daily as needed (blood pressure above 150/90.)., Disp: 90 tablet, Rfl: 3 .  atorvastatin (LIPITOR) 40 MG tablet, TAKE 1 TABLET BY MOUTH  DAILY, Disp: 90 tablet, Rfl: 0 .  blood glucose meter kit and supplies, Dispense based on patient and insurance  preference. Use up to four times daily as directed. (FOR ICD-10 E10.9, E11.9). Check blood sugar twice a day as directed., Disp: 1 each, Rfl: 0 .  Continuous Blood Gluc Receiver (FREESTYLE LIBRE 2 READER) DEVI, Use to test blood sugars up to 6 times daily. DX: E11.9, Disp: 1 each, Rfl: 0 .  Continuous Blood Gluc Sensor (FREESTYLE LIBRE 2 SENSOR) MISC, Use to test blood sugars up to 6 times daily. DX: E11.9, Disp: 6 each, Rfl: 4 .  gabapentin (NEURONTIN) 300 MG capsule, TAKE 1 CAPSULE BY MOUTH IN  THE MORNING AND TAKE 3  CAPSULES BY MOUTH IN THE  EVENING, Disp: 360 capsule, Rfl: 0 .  glipiZIDE (GLUCOTROL  XL) 5 MG 24 hr tablet, TAKE 1 TABLET BY MOUTH  DAILY WITH BREAKFAST, Disp: 90 tablet, Rfl: 0 .  glucose blood (ACCU-CHEK GUIDE) test strip, Test blood sugar 4 times per day, before meals and at bedtime., Disp: 500 strip, Rfl: 3 .  insulin NPH-regular Human (70-30) 100 UNIT/ML injection, Inject 30 Units into the skin 2 (two) times daily before a meal., Disp: , Rfl:  .  Insulin Pen Needle (PEN NEEDLES) 31G X 8 MM MISC, 1 each by Does not apply route in the morning and at bedtime., Disp: 100 each, Rfl: 11 .  Insulin Syringe-Needle U-100 (INSULIN SYRINGE .3CC/31GX5/16") 31G X 5/16" 0.3 ML MISC, Use with insulin BID Dx E11.9, Disp: 200 each, Rfl: 3 .  levothyroxine (SYNTHROID) 25 MCG tablet, Take 1 tablet (25 mcg total) by mouth daily before breakfast., Disp: 90 tablet, Rfl: 3 .  losartan (COZAAR) 50 MG tablet, TAKE 1 TABLET BY MOUTH  DAILY, Disp: 90 tablet, Rfl: 0  Allergies  Allergen Reactions  . Other Hives    Pt states that he is allergic to an ABT but he can not remember what it is     ROS: Review of Systems Pertinent items noted in HPI and remainder of comprehensive ROS otherwise negative.    Physical exam BP (!) 160/84   Pulse 98   Temp 97.7 F (36.5 C) (Temporal)   Ht _0  (1.753 m)   Wt 215 lb (97.5 kg)   SpO2 98%   BMI 31.75 kg/m  General appearance: alert, cooperative and appears stated age Head: Normocephalic, without obvious abnormality, atraumatic Eyes: negative findings: lids and lashes normal, conjunctivae and sclerae normal and corneas clear Ears: normal TM's and external ear canals both ears Nose: Nares normal. Septum midline. Mucosa normal. No drainage or sinus tenderness. Throat: Teeth are absent Neck: no adenopathy, no carotid bruit, no JVD, supple, symmetrical, trachea midline and thyroid not enlarged, symmetric, no tenderness/mass/nodules Back: symmetric, no curvature. ROM normal. No CVA tenderness. Lungs: clear to auscultation bilaterally Chest wall: no  tenderness Heart: regular rate and rhythm, S1, S2 normal, no murmur, click, rub or gallop Abdomen: soft, non-tender; bowel sounds normal; no masses,  no organomegaly Extremities: extremities normal, atraumatic, no cyanosis or edema Pulses: 2+ and symmetric Skin: Skin color, texture, turgor normal. No rashes or lesions or Onychomycotic changes noted to the great toenails bilaterally with left being affected more than the right Lymph nodes: Cervical, supraclavicular, and axillary nodes normal. Neurologic: Alert and oriented X 3, normal strength and tone. Normal symmetric reflexes. Normal coordination and gait    Assessment/ Plan: Cace Osorto here for annual physical exam.   Annual physical exam  Nocturia - Plan: PSA  Hyperlipidemia associated with type 2 diabetes mellitus ([place])  Hypertension associated with diabetes ([place])  Diabetic polyneuropathy  associated with type 2 diabetes mellitus ([place])  Type 2 diabetes mellitus with stage 3b chronic kidney disease, with long-term current use of insulin ([facility])  Abnormal TSH  Onychomycosis of great toe - Plan: terbinafine (LAMISIL) 250 MG tablet  Check PSA given nocturia  Continue statin  Blood pressure is not well controlled but it is unclear as to whether or not he has been taking his losartan.  His email from his pharmacy appears that he was to receive both amlodipine and losartan.  This was reordered  Neuropathy is stable  Start Lamisil for fungus on nails.  He is to come back in 6 weeks for recheck of liver enzymes.  Counseled on healthy lifestyle choices, including diet (rich in fruits, vegetables and lean meats and low in salt and simple carbohydrates) and exercise (at least 30 minutes of moderate physical activity daily).  Patient to follow up in 1 year for annual exam or sooner if needed.  Burnis Halling M. Lajuana Ripple, DO

## 2020-02-08 ENCOUNTER — Telehealth: Payer: Self-pay | Admitting: *Deleted

## 2020-02-08 ENCOUNTER — Telehealth: Payer: Self-pay

## 2020-02-08 DIAGNOSIS — E1122 Type 2 diabetes mellitus with diabetic chronic kidney disease: Secondary | ICD-10-CM | POA: Diagnosis not present

## 2020-02-08 DIAGNOSIS — R809 Proteinuria, unspecified: Secondary | ICD-10-CM | POA: Diagnosis not present

## 2020-02-08 DIAGNOSIS — I129 Hypertensive chronic kidney disease with stage 1 through stage 4 chronic kidney disease, or unspecified chronic kidney disease: Secondary | ICD-10-CM | POA: Diagnosis not present

## 2020-02-08 DIAGNOSIS — D696 Thrombocytopenia, unspecified: Secondary | ICD-10-CM | POA: Diagnosis not present

## 2020-02-08 DIAGNOSIS — N189 Chronic kidney disease, unspecified: Secondary | ICD-10-CM | POA: Diagnosis not present

## 2020-02-08 NOTE — Telephone Encounter (Signed)
I do not see that reflected in Dr Toya Smothers note, please have pt call their office to make sure this is what was intended.  Last note showed losartan 50mg  ONCE daily.

## 2020-02-08 NOTE — Telephone Encounter (Signed)
Spoke to pharmacy - patient can either use discount card or call the insurance an explained that it is to get patient through until Roxana Hires is received. Called patient to advise and inform that it may be a faster process for them to call the insurance directly.

## 2020-02-08 NOTE — Telephone Encounter (Signed)
Pts caregiver called stating that she called [facility] in [facility] to see if Losartan Rx was ready for pick up for pt and was told that because it was only a 30 day supply, pts insurance would not cover it and that Dr Lajuana Ripple would have to call them to get it over-rided so that pt did not have to pay.

## 2020-02-08 NOTE — Telephone Encounter (Signed)
Patient aware and verbalizes understanding. 

## 2020-02-08 NOTE — Telephone Encounter (Signed)
Kidney doctor changed Losartan from daily to twice daily. Wants to know is this going to be too much for him.

## 2020-02-09 NOTE — Telephone Encounter (Signed)
Lmtcb yesterday 02/08/20 when called patient

## 2020-02-13 ENCOUNTER — Encounter: Payer: Self-pay | Admitting: Family Medicine

## 2020-02-16 DIAGNOSIS — E113313 Type 2 diabetes mellitus with moderate nonproliferative diabetic retinopathy with macular edema, bilateral: Secondary | ICD-10-CM | POA: Diagnosis not present

## 2020-02-17 NOTE — Telephone Encounter (Signed)
Attempted to contact pt without return call in over 3 days, will close encounter. 

## 2020-02-19 ENCOUNTER — Other Ambulatory Visit: Payer: Self-pay | Admitting: Family Medicine

## 2020-02-24 ENCOUNTER — Ambulatory Visit: Payer: Medicare Other

## 2020-03-14 ENCOUNTER — Encounter: Payer: Self-pay | Admitting: Nurse Practitioner

## 2020-03-14 ENCOUNTER — Ambulatory Visit (INDEPENDENT_AMBULATORY_CARE_PROVIDER_SITE_OTHER): Payer: Medicare Other | Admitting: Nurse Practitioner

## 2020-03-14 DIAGNOSIS — R059 Cough, unspecified: Secondary | ICD-10-CM | POA: Diagnosis not present

## 2020-03-14 DIAGNOSIS — J029 Acute pharyngitis, unspecified: Secondary | ICD-10-CM | POA: Diagnosis not present

## 2020-03-14 DIAGNOSIS — R110 Nausea: Secondary | ICD-10-CM | POA: Diagnosis not present

## 2020-03-14 MED ORDER — ONDANSETRON HCL 4 MG PO TABS: 4.0000 mg | ORAL_TABLET | Freq: Three times a day (TID) | ORAL | 0 refills | Status: DC | PRN

## 2020-03-14 MED ORDER — AZITHROMYCIN 250 MG PO TABS: ORAL_TABLET | ORAL | 0 refills | Status: DC

## 2020-03-14 MED ORDER — DM-GUAIFENESIN ER 30-600 MG PO TB12: 1.0000 | ORAL_TABLET | Freq: Two times a day (BID) | ORAL | 0 refills | Status: DC

## 2020-03-14 MED ORDER — PREDNISONE 10 MG (21) PO TBPK: ORAL_TABLET | ORAL | 0 refills | Status: DC

## 2020-03-14 NOTE — Assessment & Plan Note (Signed)
This is new for patient in the last few days.  Patient is reporting postnasal drip, sore throat pain, cough, chills, loss of taste, and nausea. COVID-19 test ordered results pending.  Zithromax started. Rx sent to pharmacy Follow-up with worsening or unresolved symptoms.

## 2020-03-14 NOTE — Progress Notes (Signed)
Virtual Visit via telephone Note Due to COVID-19 pandemic this visit was conducted virtually. This visit type was conducted due to national recommendations for restrictions regarding the COVID-19 Pandemic (e.g. social distancing, sheltering in place) in an effort to limit this patient's exposure and mitigate transmission in our community. All issues noted in this document were discussed and addressed.  A physical exam was not performed with this format.  I connected with Anthony French on 03/14/20 at 09:20 AM by telephone and verified that I am speaking with the correct person using two identifiers. Anthony French is currently located at home during visit. The provider, Ivy Lynn, NP is located in their office at time of visit.  I discussed the limitations, risks, security and privacy concerns of performing an evaluation and management service by telephone and the availability of in person appointments. I also discussed with the patient that there may be a patient responsible charge related to this service. The patient expressed understanding and agreed to proceed.   History and Present Illness:  Cough This is a new problem. The current episode started in the past 7 days. The problem has been unchanged. The problem occurs constantly. The cough is productive of sputum. Associated symptoms include chills and a fever. Pertinent negatives include no chest pain or shortness of breath.  Sore Throat  This is a new problem. The current episode started in the past 7 days. The problem has been unchanged. Neither side of throat is experiencing more pain than the other. There has been no fever. The pain is moderate. Associated symptoms include coughing. Pertinent negatives include no hoarse voice, shortness of breath or swollen glands. Associated symptoms comments: Postnasal drip. He has had no exposure to strep or mono. He has tried nothing for the symptoms. The treatment provided mild relief.      Review  of Systems  Constitutional: Positive for chills and fever.  HENT: Negative for hoarse voice.   Respiratory: Positive for cough. Negative for shortness of breath.   Cardiovascular: Negative for chest pain.     Observations/Objective: Telephone visit.  Patient does not sound to be in distress  Assessment and Plan:  Cough Persistent productive cough not well controlled.  Guaifenesin ordered.  Education provided to patient.  Rx sent to pharmacy.  Follow-up with worsening or unresolved symptoms.   Sore throat This is new for patient in the last few days.  Patient is reporting postnasal drip, sore throat pain, cough, chills, loss of taste, and nausea. COVID-19 test ordered results pending.  Zithromax started. Rx sent to pharmacy Follow-up with worsening or unresolved symptoms.    Follow Up Instructions: Worsening unresolved symptoms   I discussed the assessment and treatment plan with the patient. The patient was provided an opportunity to ask questions and all were answered. The patient agreed with the plan and demonstrated an understanding of the instructions.   The patient was advised to call back or seek an in-person evaluation if the symptoms worsen or if the condition fails to improve as anticipated.  The above assessment and management plan was discussed with the patient. The patient verbalized understanding of and has agreed to the management plan. Patient is aware to call the clinic if symptoms persist or worsen. Patient is aware when to return to the clinic for a follow-up visit. Patient educated on when it is appropriate to go to the emergency department.   Time call ended:  09:30 AM  I provided 10 minutes of non-face-to-face time during  this encounter.    Ivy Lynn, NP

## 2020-03-14 NOTE — Assessment & Plan Note (Signed)
Persistent productive cough not well controlled.  Guaifenesin ordered.  Education provided to patient.  Rx sent to pharmacy.  Follow-up with worsening or unresolved symptoms.

## 2020-03-15 ENCOUNTER — Ambulatory Visit: Payer: Medicare Other | Admitting: Family Medicine

## 2020-03-15 LAB — SARS-COV-2, NAA 2 DAY TAT

## 2020-03-15 LAB — NOVEL CORONAVIRUS, NAA: SARS-CoV-2, NAA: DETECTED — AB

## 2020-03-16 ENCOUNTER — Telehealth: Payer: Self-pay | Admitting: Infectious Diseases

## 2020-03-16 NOTE — Telephone Encounter (Signed)
Called to discuss with patient about COVID-19 symptoms and the use of one of the available treatments for those with mild to moderate Covid symptoms and at a high risk of hospitalization.  Pt appears to qualify for outpatient treatment due to co-morbid conditions and/or a member of an at-risk group in accordance with the FDA Emergency Use Authorization.    Symptom onset: 1/29 per note/test order Vaccinated: not documented  Booster?  Immunocompromised? no Qualifiers: CKD3b, T2DM, age.   Unable to reach pt - LVM for the patient to call back for consideration of COVID treatment with monoclonal Ab infusion.     Janene Madeira, NP

## 2020-03-16 NOTE — Telephone Encounter (Signed)
2nd attempt - I was unable to get in touch with Anthony French via telephone and left a voicemail with callback number. He appears to be a candidate for treatment with qualifiers including CKD3, diabetes, and age.   Terri Piedra, NP 03/16/2020 2:36 PM

## 2020-03-20 ENCOUNTER — Ambulatory Visit (HOSPITAL_COMMUNITY): Payer: Medicare Other | Admitting: Hematology

## 2020-03-20 ENCOUNTER — Ambulatory Visit: Payer: Medicare Other | Admitting: Family Medicine

## 2020-03-22 ENCOUNTER — Encounter: Payer: Self-pay | Admitting: Family Medicine

## 2020-04-02 DIAGNOSIS — E119 Type 2 diabetes mellitus without complications: Secondary | ICD-10-CM | POA: Diagnosis not present

## 2020-04-02 DIAGNOSIS — Z794 Long term (current) use of insulin: Secondary | ICD-10-CM | POA: Diagnosis not present

## 2020-04-13 ENCOUNTER — Other Ambulatory Visit: Payer: Self-pay

## 2020-04-13 ENCOUNTER — Other Ambulatory Visit: Payer: Medicare Other

## 2020-04-13 DIAGNOSIS — N189 Chronic kidney disease, unspecified: Secondary | ICD-10-CM | POA: Diagnosis not present

## 2020-04-13 DIAGNOSIS — R809 Proteinuria, unspecified: Secondary | ICD-10-CM | POA: Diagnosis not present

## 2020-04-13 DIAGNOSIS — E1129 Type 2 diabetes mellitus with other diabetic kidney complication: Secondary | ICD-10-CM | POA: Diagnosis not present

## 2020-04-13 DIAGNOSIS — I129 Hypertensive chronic kidney disease with stage 1 through stage 4 chronic kidney disease, or unspecified chronic kidney disease: Secondary | ICD-10-CM | POA: Diagnosis not present

## 2020-04-13 DIAGNOSIS — N1832 Chronic kidney disease, stage 3b: Secondary | ICD-10-CM | POA: Diagnosis not present

## 2020-04-13 DIAGNOSIS — E039 Hypothyroidism, unspecified: Secondary | ICD-10-CM | POA: Diagnosis not present

## 2020-04-13 DIAGNOSIS — E1122 Type 2 diabetes mellitus with diabetic chronic kidney disease: Secondary | ICD-10-CM | POA: Diagnosis not present

## 2020-04-13 DIAGNOSIS — Z794 Long term (current) use of insulin: Secondary | ICD-10-CM | POA: Diagnosis not present

## 2020-04-14 LAB — COMPREHENSIVE METABOLIC PANEL
ALT: 15 IU/L (ref 0–44)
AST: 14 IU/L (ref 0–40)
Albumin/Globulin Ratio: 1.6 (ref 1.2–2.2)
Albumin: 3.9 g/dL (ref 3.8–4.8)
Alkaline Phosphatase: 98 IU/L (ref 44–121)
BUN/Creatinine Ratio: 10 (ref 10–24)
BUN: 22 mg/dL (ref 8–27)
Bilirubin Total: 0.3 mg/dL (ref 0.0–1.2)
CO2: 21 mmol/L (ref 20–29)
Calcium: 9.1 mg/dL (ref 8.6–10.2)
Chloride: 106 mmol/L (ref 96–106)
Creatinine, Ser: 2.12 mg/dL — ABNORMAL HIGH (ref 0.76–1.27)
Globulin, Total: 2.5 g/dL (ref 1.5–4.5)
Glucose: 323 mg/dL — ABNORMAL HIGH (ref 65–99)
Potassium: 5.5 mmol/L — ABNORMAL HIGH (ref 3.5–5.2)
Sodium: 141 mmol/L (ref 134–144)
Total Protein: 6.4 g/dL (ref 6.0–8.5)
eGFR: 34 mL/min/{1.73_m2} — ABNORMAL LOW (ref >59)

## 2020-04-14 LAB — T4, FREE: Free T4: 1.15 ng/dL (ref 0.82–1.77)

## 2020-04-14 LAB — TSH: TSH: 2.06 u[IU]/mL (ref 0.450–4.500)

## 2020-04-20 ENCOUNTER — Encounter: Payer: Self-pay | Admitting: Dietician

## 2020-04-20 ENCOUNTER — Ambulatory Visit (INDEPENDENT_AMBULATORY_CARE_PROVIDER_SITE_OTHER): Payer: Medicare Other | Admitting: Nurse Practitioner

## 2020-04-20 ENCOUNTER — Other Ambulatory Visit: Payer: Self-pay

## 2020-04-20 ENCOUNTER — Encounter: Payer: Self-pay | Admitting: Nurse Practitioner

## 2020-04-20 ENCOUNTER — Encounter: Payer: Medicare Other | Attending: "Endocrinology | Admitting: Dietician

## 2020-04-20 VITALS — BP 138/81 | HR 62 | Ht 69.0 in | Wt 207.6 lb

## 2020-04-20 DIAGNOSIS — E1122 Type 2 diabetes mellitus with diabetic chronic kidney disease: Secondary | ICD-10-CM

## 2020-04-20 DIAGNOSIS — E039 Hypothyroidism, unspecified: Secondary | ICD-10-CM | POA: Diagnosis not present

## 2020-04-20 DIAGNOSIS — E782 Mixed hyperlipidemia: Secondary | ICD-10-CM

## 2020-04-20 DIAGNOSIS — I10 Essential (primary) hypertension: Secondary | ICD-10-CM | POA: Diagnosis not present

## 2020-04-20 DIAGNOSIS — N183 Chronic kidney disease, stage 3 unspecified: Secondary | ICD-10-CM | POA: Diagnosis not present

## 2020-04-20 DIAGNOSIS — E559 Vitamin D deficiency, unspecified: Secondary | ICD-10-CM | POA: Diagnosis not present

## 2020-04-20 DIAGNOSIS — Z794 Long term (current) use of insulin: Secondary | ICD-10-CM

## 2020-04-20 DIAGNOSIS — E1169 Type 2 diabetes mellitus with other specified complication: Secondary | ICD-10-CM | POA: Diagnosis not present

## 2020-04-20 DIAGNOSIS — N1832 Chronic kidney disease, stage 3b: Secondary | ICD-10-CM

## 2020-04-20 LAB — POCT GLYCOSYLATED HEMOGLOBIN (HGB A1C): HbA1c, POC (controlled diabetic range): 7.1 % — AB (ref 0.0–7.0)

## 2020-04-20 NOTE — Patient Instructions (Addendum)
Move your lunchtime sandwiches to around 1:00-2:00 PM.  When you order your food at [facility], choose a different vegetable each time.  Work towards having your meal fit the proportions of the balanced plate. Make your plate 1/2 non-starchy vegetables, 1/4 starches, and 1/4 protein.

## 2020-04-20 NOTE — Progress Notes (Signed)
Medical Nutrition Therapy:  Appt start time: 1100 end time:  1130.  Assessment:  Primary concerns today: Type 2 Dm. CKD  Pt reports having COVID last month and was not able to take their diabetes medications while being sick. Pt started back taking medication about two weeks ago. Pt has gotten a little more active since recovering from [place]. Pt usually makes 1-2 sandwiches for lunch around 3:00 pm. Pt reports eating less ice cream since getting COVID.  Pt reports eating green beans or okra for vegetables while eating out. Pt also reports eating a lot of potatoes. Pt states they do not cook, and only eat at home maybe once a month. Pt still using [employer] and really likes it.   Wt Readings from Last 3 Encounters:  04/20/20 207 lb 9.6 oz (94.2 kg)  02/07/20 215 lb (97.5 kg)  12/22/19 219 lb (99.3 kg)     Lab Results  Component Value Date   HGBA1C 7.1 (A) 04/20/2020    CMP Latest Ref Rng & Units 04/13/2020 09/13/2019 05/11/2019  Glucose 65 - 99 mg/dL 323(H) 206(H) 272(H)  BUN 8 - 27 mg/dL 22 28(H) 24(H)  Creatinine 0.76 - 1.27 mg/dL 2.12(H) 1.72(H) 1.71(H)  Sodium 134 - 144 mmol/L 141 143 141  Potassium 3.5 - 5.2 mmol/L 5.5(H) 4.9 5.0  Chloride 96 - 106 mmol/L 106 110(H) 106  CO2 20 - 29 mmol/L 21 22 24   Calcium 8.6 - 10.2 mg/dL 9.1 8.6 9.1  Total Protein 6.0 - 8.5 g/dL 6.4 6.1 6.7  Total Bilirubin 0.0 - 1.2 mg/dL 0.3 0.5 0.8  Alkaline Phos 44 - 121 IU/L 98 76 72  AST 0 - 40 IU/L 14 13 17   ALT 0 - 44 IU/L 15 16 21    Lipid Panel     Component Value Date/Time   CHOL 109 09/13/2019 1420   TRIG 126 09/13/2019 1420   HDL 33 (L) 09/13/2019 1420   CHOLHDL 3.3 09/13/2019 1420   LDLCALC 53 09/13/2019 1420   LABVLDL 23 09/13/2019 1420   .  Usual physical activity:ADKL   Breakfast(9-10 AM) Steak 'um cheese omelette, bowl of peaches ([facility]), water Lunch(3:00 PM) None Dinner(6:00 PM)  Spaghetti and a salad, water Beverages: water, 1 diet soda a day    Estimated energy  needs: 1800  calories 200 g carbohydrates 135 g protein 50 g fat  Progress Towards Goal(s):  In progress.   Nutritional Diagnosis:  NB-1.1 Food and nutrition-related knowledge deficit As related to Diabetes Type 2.  As evidenced by A1C 9%.    Intervention:  Nutrition and Diabetes education provided on My Plate, CHO counting, meal planning, portion sizes, timing of meals, avoiding snacks between meals unless having a low blood sugar, target ranges for A1C and blood sugars, signs/symptoms and treatment of hyper/hypoglycemia, monitoring blood sugars, taking medications as prescribed, benefits of exercising 30 minutes per day and prevention of complications of DM.   Goals   Move your lunchtime sandwiches to around 1:00-2:00 PM.  When you order your food at [facility], choose a different vegetable each time.   Work towards having your meal fit the proportions of the balanced plate.  Make your plate 1/2 non-starchy vegetables, 1/4 starches, and 1/4 protein.   Teaching Method Utilized:  Visual Auditory Hands on  Handouts given during visit include:  The Plate Method   Diabetes Instructions.   Barriers to learning/adherence to lifestyle change:   Demonstrated degree of understanding via:  Teach Back   Monitoring/Evaluation:  Dietary  intake, exercise, and body weight in 3 month(s).

## 2020-04-20 NOTE — Progress Notes (Signed)
04/20/2020, 11:59 AM  Endocrinology follow-up note   Subjective:    Patient ID: Anthony French, male    DOB: 1955/10/30.  Anthony French is being seen in follow-up after he was seen in consultation for management of currently uncontrolled symptomatic diabetes requested by  Janora Norlander, DO.   Past Medical History:  Diagnosis Date  . Diabetes ([place])   . Diabetic neuropathy ([place])   . Essential hypertension, benign 06/01/2019  . Hyperlipidemia     Past Surgical History:  Procedure Laterality Date  . APPENDECTOMY  1980  . CYST REMOVAL TRUNK      Social History   Socioeconomic History  . Marital status: Single    Spouse name: Not on file  . Number of children: 3  . Years of education: Not on file  . Highest education level: Not on file  Occupational History  . Occupation: retired  Tobacco Use  . Smoking status: Never Smoker  . Smokeless tobacco: Never Used  Vaping Use  . Vaping Use: Never used  Substance and Sexual Activity  . Alcohol use: Not Currently  . Drug use: Never  . Sexual activity: Not Currently  Other Topics Concern  . Not on file  Social History Narrative  . Not on file   Social Determinants of Health   Financial Resource Strain: Not on file  Food Insecurity: Not on file  Transportation Needs: Not on file  Physical Activity: Not on file  Stress: Not on file  Social Connections: Not on file    Family History  Problem Relation Age of Onset  . Stroke Mother   . Diabetes Mother   . Hypertension Mother   . Hyperlipidemia Mother   . Kidney disease Mother   . COPD Father   . Diabetes Sister   . Stroke Brother   . Diabetes Brother   . Arthritis Maternal Grandmother     Outpatient Encounter Medications as of 04/20/2020  Medication Sig  . amLODipine (NORVASC) 5 MG tablet Take 1 tablet (5 mg total) by mouth daily as needed (blood pressure above 150/90.).  Marland Kitchen atorvastatin (LIPITOR) 40 MG tablet TAKE 1 TABLET BY MOUTH   DAILY  . blood glucose meter kit and supplies Dispense based on patient and insurance preference. Use up to four times daily as directed. (FOR ICD-10 E10.9, E11.9). Check blood sugar twice a day as directed.  . Continuous Blood Gluc Receiver (FREESTYLE LIBRE 2 READER) DEVI Use to test blood sugars up to 6 times daily. DX: E11.9  . Continuous Blood Gluc Sensor (FREESTYLE LIBRE 2 SENSOR) MISC Use to test blood sugars up to 6 times daily. DX: E11.9  . gabapentin (NEURONTIN) 300 MG capsule TAKE 1 CAPSULE BY MOUTH IN  THE MORNING AND TAKE 3  CAPSULES BY MOUTH IN THE  EVENING  . glipiZIDE (GLUCOTROL XL) 5 MG 24 hr tablet TAKE 1 TABLET BY MOUTH  DAILY WITH BREAKFAST  . glucose blood (ACCU-CHEK GUIDE) test strip Test blood sugar 4 times per day, before meals and at bedtime.  . insulin NPH-regular Human (70-30) 100 UNIT/ML injection Inject 30 Units into the skin 2 (two) times daily before a meal.  . Insulin Pen Needle (PEN NEEDLES) 31G X 8 MM MISC 1 each by Does not apply route in the morning and at bedtime.  . Insulin Syringe-Needle U-100 (INSULIN SYRINGE .3CC/31GX5/16") 31G X 5/16" 0.3 ML MISC Use with insulin BID Dx E11.9  . levothyroxine (SYNTHROID) 25 MCG tablet  Take 1 tablet (25 mcg total) by mouth daily before breakfast.  . losartan (COZAAR) 50 MG tablet Take 1 tablet (50 mg total) by mouth daily.  Marland Kitchen terbinafine (LAMISIL) 250 MG tablet Take 1 tablet (250 mg total) by mouth daily. Come in for labs in 6 weeks  . [DISCONTINUED] azithromycin (ZITHROMAX) 250 MG tablet 2 tablets by mouth day 1.  1 tablet day 2-5.  . [DISCONTINUED] dextromethorphan-guaiFENesin (MUCINEX DM) 30-600 MG 12hr tablet Take 1 tablet by mouth 2 (two) times daily.  . [DISCONTINUED] losartan (COZAAR) 50 MG tablet Take 1 tablet (50 mg total) by mouth daily. [facility] (mail order not yet received)  . [DISCONTINUED] ondansetron (ZOFRAN) 4 MG tablet Take 1 tablet (4 mg total) by mouth every 8 (eight) hours as needed for nausea or  vomiting.  . [DISCONTINUED] predniSONE (STERAPRED UNI-PAK 21 TAB) 10 MG (21) TBPK tablet 6 tablets day 1, 5 tablets day 2, 4 tablets day 3, 3 tablets day 4, 2 tablet day 5, 1 tablet day 6.   No facility-administered encounter medications on file as of 04/20/2020.    ALLERGIES: Allergies  Allergen Reactions  . Other Hives    Pt states that he is allergic to an ABT but he can not remember what it is    VACCINATION STATUS: Immunization History  Administered Date(s) Administered  . Fluad Quad(high Dose 65+) 11/02/2019  . Influenza,inj,Quad PF,6+ Mos 12/25/2016  . Pneumococcal Conjugate-13 12/27/2018  . Pneumococcal Polysaccharide-23 04/16/2017  . Tdap 08/19/2017  . Zoster Recombinat (Shingrix) 07/28/2019    Diabetes He presents for his follow-up diabetic visit. He has type 2 diabetes mellitus. Onset time: He was diagnosed at approximate age of 65 years. His disease course has been stable. Hypoglycemia symptoms include sweats. Pertinent negatives for hypoglycemia include no headaches, nervousness/anxiousness, pallor or tremors. Pertinent negatives for diabetes include no chest pain, no fatigue, no polydipsia, no polyphagia, no polyuria and no weight loss. There are no hypoglycemic complications. Symptoms are stable. Diabetic complications include nephropathy, peripheral neuropathy and retinopathy. Risk factors for coronary artery disease include dyslipidemia, diabetes mellitus, hypertension, male sex, sedentary lifestyle and family history. Current diabetic treatment includes insulin injections and oral agent (monotherapy). He is compliant with treatment most of the time. His weight is fluctuating minimally. He is following a generally unhealthy diet. When asked about meal planning, he reported none. He has had a previous visit with a dietitian. He never participates in exercise. His home blood glucose trend is fluctuating minimally. His breakfast blood glucose range is generally 140-180 mg/dl.  His lunch blood glucose range is generally 140-180 mg/dl. His dinner blood glucose range is generally 180-200 mg/dl. His bedtime blood glucose range is generally 140-180 mg/dl. (He presents today with his CGM and logs showing stable glycemic profile.  His POCT A1c today is 7.1%, increasing slightly from previous visit of 6.8%, but still an acceptable target for him.  Analysis of his CGM shows TIR 61%, TAR 38%, TBR 1%.  He only reports rare occasions of hypoglycemia now which is much improved.) An ACE inhibitor/angiotensin II receptor blocker is being taken. He does not see a podiatrist.Eye exam is current.  Hyperlipidemia This is a chronic problem. The current episode started more than 1 year ago. The problem is controlled. Recent lipid tests were reviewed and are normal. Exacerbating diseases include chronic renal disease, diabetes and hypothyroidism. There are no known factors aggravating his hyperlipidemia. Pertinent negatives include no chest pain, myalgias or shortness of breath. Current antihyperlipidemic treatment includes  statins. The current treatment provides significant improvement of lipids. There are no compliance problems.  Risk factors for coronary artery disease include diabetes mellitus, dyslipidemia, hypertension, male sex and a sedentary lifestyle.  Hypertension This is a chronic problem. The current episode started more than 1 year ago. The problem has been gradually improving since onset. The problem is controlled. Associated symptoms include sweats. Pertinent negatives include no chest pain, headaches, palpitations or shortness of breath. There are no associated agents to hypertension. Risk factors for coronary artery disease include dyslipidemia, diabetes mellitus, family history, male gender and sedentary lifestyle. Past treatments include calcium channel blockers and angiotensin blockers. The current treatment provides mild improvement. There are no compliance problems.  Hypertensive  end-organ damage includes kidney disease and retinopathy. Identifiable causes of hypertension include chronic renal disease and a thyroid problem.  Thyroid Problem Presents for follow-up (Abnormality noted with recent thyroid studies.  No prior work-up done, no previous labs to compare to.) visit. Onset time: Unknown. Patient reports no anxiety, cold intolerance, constipation, diarrhea, fatigue, heat intolerance, palpitations, tremors, weight gain or weight loss. The symptoms have been stable. Past treatments include nothing. His past medical history is significant for diabetes and hyperlipidemia. There are no known risk factors.    Review of systems  Constitutional: + Minimally fluctuating body weight,  current Body mass index is 30.66 kg/m. , no fatigue, no subjective hyperthermia, no subjective hypothermia Eyes: no blurry vision, no xerophthalmia ENT: no sore throat, no nodules palpated in throat, no dysphagia/odynophagia, no hoarseness Cardiovascular: no chest pain, no shortness of breath, no palpitations, no leg swelling Respiratory: no cough, no shortness of breath Gastrointestinal: no nausea/vomiting/diarrhea Musculoskeletal: no muscle/joint aches Skin: no rashes, no hyperemia Neurological: no tremors, no numbness, no tingling, no dizziness Psychiatric: no depression, no anxiety   Objective:    BP 138/81 (BP Location: Right Arm)   Pulse 62   Ht _0  (1.753 m)   Wt 207 lb 9.6 oz (94.2 kg)   BMI 30.66 kg/m   Wt Readings from Last 3 Encounters:  04/20/20 207 lb 9.6 oz (94.2 kg)  02/07/20 215 lb (97.5 kg)  12/22/19 219 lb (99.3 kg)     BP Readings from Last 3 Encounters:  04/20/20 138/81  02/07/20 (!) 160/84  12/22/19 (!) 180/91     Physical Exam- Limited  Constitutional:  Body mass index is 30.66 kg/m. , not in acute distress, normal state of mind Eyes:  EOMI, no exophthalmos Neck: Supple Thyroid: No gross goiter Cardiovascular: RRR, no murmers, rubs, or gallops,  no edema Respiratory: Adequate breathing efforts, no crackles, rales, rhonchi, or wheezing Musculoskeletal: no gross deformities, strength intact in all four extremities, no gross restriction of joint movements Skin:  no rashes, no hyperemia Neurological: no tremor with outstretched hands  POCT ABI Results 04/20/20   Right ABI:  1.23      Left ABI:  1.30  Right leg systolic / diastolic: 614/43 mmHg Left leg systolic / diastolic: 154/00 mmHg  Arm systolic / diastolic: 867/61 mmHG  Detailed report will be scanned into patient chart.   CMP ( most recent) CMP     Component Value Date/Time   NA 141 04/13/2020 1400   K 5.5 (H) 04/13/2020 1400   CL 106 04/13/2020 1400   CO2 21 04/13/2020 1400   GLUCOSE 323 (H) 04/13/2020 1400   GLUCOSE 272 (H) 05/11/2019 1254   BUN 22 04/13/2020 1400   CREATININE 2.12 (H) 04/13/2020 1400   CALCIUM 9.1 04/13/2020 1400  PROT 6.4 04/13/2020 1400   ALBUMIN 3.9 04/13/2020 1400   AST 14 04/13/2020 1400   ALT 15 04/13/2020 1400   ALKPHOS 98 04/13/2020 1400   BILITOT 0.3 04/13/2020 1400   GFRNONAA 41 (L) 09/13/2019 1420   GFRAA 47 (L) 09/13/2019 1420    Diabetic Labs (most recent): Lab Results  Component Value Date   HGBA1C 7.1 (A) 04/20/2020   HGBA1C 6.8 (A) 12/22/2019   HGBA1C 6.4 (A) 09/21/2019     Lipid Panel ( most recent) Lipid Panel     Component Value Date/Time   CHOL 109 09/13/2019 1420   TRIG 126 09/13/2019 1420   HDL 33 (L) 09/13/2019 1420   CHOLHDL 3.3 09/13/2019 1420   LDLCALC 53 09/13/2019 1420   LABVLDL 23 09/13/2019 1420     Assessment & Plan:   1) Type 2 diabetes mellitus with stage 3b chronic kidney disease, with long-term current use of insulin (HCC)  - Anthony French has currently uncontrolled symptomatic type 2 DM since  65 years of age.   Recent labs reviewed.  He presents today with his CGM and logs showing stable glycemic profile.  His POCT A1c today is 7.1%, increasing slightly from previous visit of 6.8%,  but still an acceptable target for him.  Analysis of his CGM shows TIR 61%, TAR 38%, TBR 1%.  He only reports rare occasions of hypoglycemia now which is much improved.   - I had a long discussion with him about the progressive nature of diabetes and the pathology behind its complications. -his diabetes is complicated by CKD, peripheral neuropathy and he remains at a high risk for more acute and chronic complications which include CAD, CVA, CKD, retinopathy, and neuropathy. These are all discussed in detail with him.  - Nutritional counseling repeated at each appointment due to patients tendency to fall back in to old habits.  - The patient admits there is a room for improvement in their diet and drink choices. -  Suggestion is made for the patient to avoid simple carbohydrates from their diet including Cakes, Sweet Desserts / Pastries, Ice Cream, Soda (diet and regular), Sweet Tea, Candies, Chips, Cookies, Sweet Pastries,  Store Bought Juices, Alcohol in Excess of  1-2 drinks a day, Artificial Sweeteners, Coffee Creamer, and "Sugar-free" Products. This will help patient to have stable blood glucose profile and potentially avoid unintended weight gain.   - I encouraged the patient to switch to  unprocessed or minimally processed complex starch and increased protein intake (animal or plant source), fruits, and vegetables.   - Patient is advised to stick to a routine mealtimes to eat 3 meals  a day and avoid unnecessary snacks ( to snack only to correct hypoglycemia).  - he is scheduled with Jearld Fenton, RDN, CDE for diabetes education after his appointment with me today.  - I have approached him with the following individualized plan to manage  his diabetes and patient agrees:   - he will continue to benefit from simplicity of premixed insulin.   Given his stable and improved glycemic profile, he will remain on current medication regimen consisting of Novolin 70/30 30 units with breakfast and  supper if glucose is above 90 and he is eating and continue Glipizide 5 mg XL daily with breakfast.    -He is encouraged to continue using his CGM to monitor blood glucose 4 times per day, before meals and before bed, and call the clinic if he has readings less than 70 or greater than  300 for 3 tests in a row.  - Specific targets for  A1c;  LDL, HDL,  and Triglycerides were discussed with the patient.  2) Blood Pressure /Hypertension:  His blood pressure is controlled to target.  He is advised to continue Cozaar 50 mg po daily as well as Amlodipine 5 mg po daily.  3) Lipids/Hyperlipidemia:  His most recent lipid panel from 09/13/19 shows controlled LDL of 53.  He is advised to continue Atorvastatin 40 mg po daily at bedtime.  Side effects and precautions discussed with him.  Will recheck lipid panel prior to next visit.  4)  Weight/Diet:  His Body mass index is 30.66 kg/m.     he is a candidate for some weight loss. I discussed with him the fact that loss of 5 - 10% of his  current body weight will have the most impact on his diabetes management.  Exercise, and detailed carbohydrates information provided  -  detailed on discharge instructions.  5) Elevated TSH- Hypothyroidism: His previsit thyroid function tests are consistent with appropriate hormone replacement.  He is advised to continue Levothyroxine 25 mcg po daily before breakfast.    - The correct intake of thyroid hormone (Levothyroxine, Synthroid), is on empty stomach first thing in the morning, with water, separated by at least 30 minutes from breakfast and other medications,  and separated by more than 4 hours from calcium, iron, multivitamins, acid reflux medications (PPIs).  - This medication is a life-long medication and will be needed to correct thyroid hormone imbalances for the rest of your life.  The dose may change from time to time, based on thyroid blood work.  - It is extremely important to be consistent taking this  medication, near the same time each morning.  -AVOID TAKING PRODUCTS CONTAINING BIOTIN (commonly found in Hair, Skin, Nails vitamins) AS IT INTERFERES WITH THE VALIDITY OF THYROID FUNCTION BLOOD TESTS.  6) Chronic Care/Health Maintenance: -he is on ARB and Statin medications and  is encouraged to initiate and continue to follow up with Ophthalmology, Dentist,  Podiatrist at least yearly or according to recommendations, and advised to stay away from smoking. I have recommended yearly flu vaccine and pneumonia vaccine at least every 5 years; moderate intensity exercise for up to 150 minutes weekly; and  sleep for at least 7 hours a day.  - he is  advised to maintain close follow up with Janora Norlander, DO for primary care needs, as well as his other providers for optimal and coordinated care.   - Time spent on this patient care encounter:  40 min, of which > 50% was spent in  counseling and the rest reviewing his blood glucose logs , discussing his hypoglycemia and hyperglycemia episodes, reviewing his current and  previous labs / studies  ( including abstraction from other facilities) and medications  doses and developing a  long term treatment plan and documenting his care.   Please refer to Patient Instructions for Blood Glucose Monitoring and Insulin/Medications Dosing Guide"  in media tab for additional information. Please  also refer to " Patient Self Inventory" in the Media  tab for reviewed elements of pertinent patient history.  Anthony French participated in the discussions, expressed understanding, and voiced agreement with the above plans.  All questions were answered to his satisfaction. he is encouraged to contact clinic should he have any questions or concerns prior to his return visit.   Follow up plan: - Return in about 4 months (around 08/20/2020)  for Diabetes follow up- A1c and urine micro in office, Thyroid follow up, Previsit labs.  Rayetta Pigg, [facility] [address] [place], [place] [address] Phone: [phone] Fax: [phone]   04/20/2020, 11:59 AM

## 2020-04-20 NOTE — Patient Instructions (Signed)

## 2020-04-30 ENCOUNTER — Ambulatory Visit: Payer: Medicare Other | Admitting: Family Medicine

## 2020-05-04 DIAGNOSIS — H43823 Vitreomacular adhesion, bilateral: Secondary | ICD-10-CM | POA: Diagnosis not present

## 2020-05-04 DIAGNOSIS — H35033 Hypertensive retinopathy, bilateral: Secondary | ICD-10-CM | POA: Diagnosis not present

## 2020-05-04 DIAGNOSIS — E113313 Type 2 diabetes mellitus with moderate nonproliferative diabetic retinopathy with macular edema, bilateral: Secondary | ICD-10-CM | POA: Diagnosis not present

## 2020-05-10 DIAGNOSIS — D638 Anemia in other chronic diseases classified elsewhere: Secondary | ICD-10-CM | POA: Diagnosis not present

## 2020-05-10 DIAGNOSIS — E875 Hyperkalemia: Secondary | ICD-10-CM | POA: Diagnosis not present

## 2020-05-10 DIAGNOSIS — R809 Proteinuria, unspecified: Secondary | ICD-10-CM | POA: Diagnosis not present

## 2020-05-10 DIAGNOSIS — E1122 Type 2 diabetes mellitus with diabetic chronic kidney disease: Secondary | ICD-10-CM | POA: Diagnosis not present

## 2020-05-10 DIAGNOSIS — I129 Hypertensive chronic kidney disease with stage 1 through stage 4 chronic kidney disease, or unspecified chronic kidney disease: Secondary | ICD-10-CM | POA: Diagnosis not present

## 2020-05-10 DIAGNOSIS — E1129 Type 2 diabetes mellitus with other diabetic kidney complication: Secondary | ICD-10-CM | POA: Diagnosis not present

## 2020-05-10 DIAGNOSIS — N189 Chronic kidney disease, unspecified: Secondary | ICD-10-CM | POA: Diagnosis not present

## 2020-05-18 ENCOUNTER — Other Ambulatory Visit: Payer: Self-pay

## 2020-05-18 ENCOUNTER — Other Ambulatory Visit: Payer: Medicare Other

## 2020-05-18 DIAGNOSIS — E1122 Type 2 diabetes mellitus with diabetic chronic kidney disease: Secondary | ICD-10-CM | POA: Diagnosis not present

## 2020-05-18 DIAGNOSIS — E1129 Type 2 diabetes mellitus with other diabetic kidney complication: Secondary | ICD-10-CM | POA: Diagnosis not present

## 2020-05-18 DIAGNOSIS — R809 Proteinuria, unspecified: Secondary | ICD-10-CM | POA: Diagnosis not present

## 2020-05-18 DIAGNOSIS — N189 Chronic kidney disease, unspecified: Secondary | ICD-10-CM | POA: Diagnosis not present

## 2020-05-18 DIAGNOSIS — I129 Hypertensive chronic kidney disease with stage 1 through stage 4 chronic kidney disease, or unspecified chronic kidney disease: Secondary | ICD-10-CM | POA: Diagnosis not present

## 2020-05-24 ENCOUNTER — Ambulatory Visit: Payer: Medicare Other

## 2020-06-01 DIAGNOSIS — E113313 Type 2 diabetes mellitus with moderate nonproliferative diabetic retinopathy with macular edema, bilateral: Secondary | ICD-10-CM | POA: Diagnosis not present

## 2020-06-06 ENCOUNTER — Encounter: Payer: Self-pay | Admitting: Family Medicine

## 2020-06-06 ENCOUNTER — Other Ambulatory Visit: Payer: Self-pay

## 2020-06-06 ENCOUNTER — Ambulatory Visit (INDEPENDENT_AMBULATORY_CARE_PROVIDER_SITE_OTHER): Payer: Medicare Other | Admitting: Family Medicine

## 2020-06-06 VITALS — BP 141/77 | HR 63 | Temp 97.4°F | Ht 69.0 in | Wt 211.4 lb

## 2020-06-06 DIAGNOSIS — E875 Hyperkalemia: Secondary | ICD-10-CM | POA: Diagnosis not present

## 2020-06-06 DIAGNOSIS — B351 Tinea unguium: Secondary | ICD-10-CM

## 2020-06-06 NOTE — Progress Notes (Signed)
Subjective:  CC: Onychomycosis follow up PCP: Janora Norlander, DO GYF:VCBSW Anthony French is a 65 y.o. male presenting to clinic today for:  1.  Onychomycosis Patient was started on Lamisil 250 mg daily at last visit for onychomycosis of his toenails.  He is here for interval follow-up and liver enzyme check.  He notes that the discoloration of his left great toenail really has not changed at all with the use of Lamisil.  He is willing to see a podiatrist.  2.  Hyperkalemia Patient noted to be hyperkalemic by his nephrologist recently.  He was started on [facility] and is using this a couple of times per week.  Has not had a repeat renal function panel since that time but seems to be tolerating the medication without difficulty.  No reports of heart palpitations   ROS: Per HPI  Allergies  Allergen Reactions  . Other Hives    Pt states that he is allergic to an ABT but he can not remember what it is   Past Medical History:  Diagnosis Date  . Diabetes ([place])   . Diabetic neuropathy ([place])   . Essential hypertension, benign 06/01/2019  . Hyperlipidemia     Current Outpatient Medications:  .  amLODipine (NORVASC) 5 MG tablet, Take 1 tablet (5 mg total) by mouth daily as needed (blood pressure above 150/90.)., Disp: 90 tablet, Rfl: 3 .  atorvastatin (LIPITOR) 40 MG tablet, TAKE 1 TABLET BY MOUTH  DAILY, Disp: 90 tablet, Rfl: 1 .  blood glucose meter kit and supplies, Dispense based on patient and insurance preference. Use up to four times daily as directed. (FOR ICD-10 E10.9, E11.9). Check blood sugar twice a day as directed., Disp: 1 each, Rfl: 0 .  Continuous Blood Gluc Receiver (FREESTYLE LIBRE 2 READER) DEVI, Use to test blood sugars up to 6 times daily. DX: E11.9, Disp: 1 each, Rfl: 0 .  Continuous Blood Gluc Sensor (FREESTYLE LIBRE 2 SENSOR) MISC, Use to test blood sugars up to 6 times daily. DX: E11.9, Disp: 6 each, Rfl: 4 .  gabapentin (NEURONTIN) 300 MG capsule, TAKE 1 CAPSULE BY MOUTH  IN  THE MORNING AND TAKE 3  CAPSULES BY MOUTH IN THE  EVENING, Disp: 360 capsule, Rfl: 0 .  glipiZIDE (GLUCOTROL XL) 5 MG 24 hr tablet, TAKE 1 TABLET BY MOUTH  DAILY WITH BREAKFAST, Disp: 90 tablet, Rfl: 0 .  glucose blood (ACCU-CHEK GUIDE) test strip, Test blood sugar 4 times per day, before meals and at bedtime., Disp: 500 strip, Rfl: 3 .  insulin NPH-regular Human (70-30) 100 UNIT/ML injection, Inject 30 Units into the skin 2 (two) times daily before a meal., Disp: , Rfl:  .  Insulin Pen Needle (PEN NEEDLES) 31G X 8 MM MISC, 1 each by Does not apply route in the morning and at bedtime., Disp: 100 each, Rfl: 11 .  Insulin Syringe-Needle U-100 (INSULIN SYRINGE .3CC/31GX5/16") 31G X 5/16" 0.3 ML MISC, Use with insulin BID Dx E11.9, Disp: 200 each, Rfl: 3 .  levothyroxine (SYNTHROID) 25 MCG tablet, Take 1 tablet (25 mcg total) by mouth daily before breakfast., Disp: 90 tablet, Rfl: 3 .  losartan (COZAAR) 50 MG tablet, Take 1 tablet (50 mg total) by mouth daily., Disp: 90 tablet, Rfl: 3 .  terbinafine (LAMISIL) 250 MG tablet, Take 1 tablet (250 mg total) by mouth daily. Come in for labs in 6 weeks, Disp: 90 tablet, Rfl: 0 Social History   Socioeconomic History  . Marital status: Single  Spouse name: Not on file  . Number of children: 3  . Years of education: Not on file  . Highest education level: Not on file  Occupational History  . Occupation: retired  Tobacco Use  . Smoking status: Never Smoker  . Smokeless tobacco: Never Used  Vaping Use  . Vaping Use: Never used  Substance and Sexual Activity  . Alcohol use: Not Currently  . Drug use: Never  . Sexual activity: Not Currently  Other Topics Concern  . Not on file  Social History Narrative  . Not on file   Social Determinants of Health   Financial Resource Strain: Not on file  Food Insecurity: Not on file  Transportation Needs: Not on file  Physical Activity: Not on file  Stress: Not on file  Social Connections: Not on file   Intimate Partner Violence: Not on file   Family History  Problem Relation Age of Onset  . Stroke Mother   . Diabetes Mother   . Hypertension Mother   . Hyperlipidemia Mother   . Kidney disease Mother   . COPD Father   . Diabetes Sister   . Stroke Brother   . Diabetes Brother   . Arthritis Maternal Grandmother     Objective: Office vital signs reviewed. BP (!) 141/77   Pulse 63   Temp (!) 97.4 F (36.3 C)   Ht 5' 9"  (1.753 m)   Wt 211 lb 6.4 oz (95.9 kg)   SpO2 98%   BMI 31.22 kg/m   Physical Examination:  General: Awake, alert, well nourished, No acute distress HEENT: Sclera white.  No jaundice Pulm: Normal work of breathing on room air Skin: Left great toenail with yellow, thickening of the nail.  He only has the medial lower portion of the nail that is clear and pink.  Assessment/ Plan: 65 y.o. male   Onychomycosis of great toe - Plan: CMP14+EGFR, Ambulatory referral to Podiatry  Hyperkalemia - Plan: CMP14+EGFR  Recheck renal function panel, liver enzymes.  Will CC to nephrology once available  Referral to podiatry.  Wonder if he will need the toenail totally removed.  Surprised that he has not had any change in the nail with Lamisil.  Wonder if this is perhaps damage to the underlying nailbed in fact not onychomycosis  Recheck potassium level.  Currently being treated with [facility] by nephrology  Orders Placed This Encounter  Procedures  . CMP14+EGFR  . Ambulatory referral to Podiatry    Referral Priority:   Routine    Referral Type:   Consultation    Referral Reason:   Specialty Services Required    Requested Specialty:   Podiatry    Number of Visits Requested:   1   No orders of the defined types were placed in this encounter.    Janora Norlander, DO [place] [phone]

## 2020-06-07 LAB — CMP14+EGFR
ALT: 12 IU/L (ref 0–44)
AST: 12 IU/L (ref 0–40)
Albumin/Globulin Ratio: 2.2 (ref 1.2–2.2)
Albumin: 4.2 g/dL (ref 3.8–4.8)
Alkaline Phosphatase: 72 IU/L (ref 44–121)
BUN/Creatinine Ratio: 21 (ref 10–24)
BUN: 44 mg/dL — ABNORMAL HIGH (ref 8–27)
Bilirubin Total: 0.3 mg/dL (ref 0.0–1.2)
CO2: 19 mmol/L — ABNORMAL LOW (ref 20–29)
Calcium: 8.7 mg/dL (ref 8.6–10.2)
Chloride: 108 mmol/L — ABNORMAL HIGH (ref 96–106)
Creatinine, Ser: 2.13 mg/dL — ABNORMAL HIGH (ref 0.76–1.27)
Globulin, Total: 1.9 g/dL (ref 1.5–4.5)
Glucose: 184 mg/dL — ABNORMAL HIGH (ref 65–99)
Potassium: 6.2 mmol/L (ref 3.5–5.2)
Sodium: 143 mmol/L (ref 134–144)
Total Protein: 6.1 g/dL (ref 6.0–8.5)
eGFR: 34 mL/min/{1.73_m2} — ABNORMAL LOW (ref >59)

## 2020-06-07 NOTE — Progress Notes (Signed)
Noted, thanks for the update 

## 2020-06-13 ENCOUNTER — Other Ambulatory Visit: Payer: Self-pay | Admitting: Family Medicine

## 2020-06-19 ENCOUNTER — Ambulatory Visit: Payer: Medicare Other | Admitting: Podiatry

## 2020-06-26 ENCOUNTER — Encounter: Payer: Self-pay | Admitting: Podiatry

## 2020-06-26 ENCOUNTER — Telehealth: Payer: Self-pay

## 2020-06-26 ENCOUNTER — Other Ambulatory Visit: Payer: Self-pay

## 2020-06-26 ENCOUNTER — Ambulatory Visit (INDEPENDENT_AMBULATORY_CARE_PROVIDER_SITE_OTHER): Payer: Medicare Other | Admitting: Podiatry

## 2020-06-26 DIAGNOSIS — B351 Tinea unguium: Secondary | ICD-10-CM | POA: Diagnosis not present

## 2020-06-26 DIAGNOSIS — L603 Nail dystrophy: Secondary | ICD-10-CM | POA: Diagnosis not present

## 2020-06-26 MED ORDER — JUBLIA 10 % EX SOLN: 1.0000 "application " | Freq: Every day | CUTANEOUS | 5 refills | Status: DC

## 2020-06-26 NOTE — Telephone Encounter (Signed)
Left great toenail specimen mailed to [facility] for fungal culture

## 2020-06-27 NOTE — Progress Notes (Signed)
  Subjective:  Patient ID: Anthony French, male    DOB: September 29, 1955,  MRN: [id number]  Chief Complaint  Patient presents with  . Nail Problem      (NP)Onychomycosis of great toe    65 y.o. male presents with the above complaint. History confirmed with patient.  He has been on Lamisil for 3 months  Objective:  Physical Exam: warm, good capillary refill, no trophic changes or ulcerative lesions, normal DP and PT pulses and normal sensory exam. Left Foot: Left hallux superficial white onychomycosis      Assessment:   1. Onychomycosis      Plan:  Patient was evaluated and treated and all questions answered.  Discussed etiology and treatment option of nail fungus.  He did not improve with terbinafine.  Taking a culture today to assess if this is a yeast or other atypical species.  Jublia prescription was sent we will changes to ciclopirox if not covered.  May repeat oral treatment with different agent such as fluconazole pulse therapy if not improving  Return in about 2 months (around 08/26/2020) for follow up on nail fungus .

## 2020-06-29 DIAGNOSIS — E113313 Type 2 diabetes mellitus with moderate nonproliferative diabetic retinopathy with macular edema, bilateral: Secondary | ICD-10-CM | POA: Diagnosis not present

## 2020-07-02 DIAGNOSIS — E119 Type 2 diabetes mellitus without complications: Secondary | ICD-10-CM | POA: Diagnosis not present

## 2020-07-02 DIAGNOSIS — Z794 Long term (current) use of insulin: Secondary | ICD-10-CM | POA: Diagnosis not present

## 2020-07-11 DIAGNOSIS — E872 Acidosis: Secondary | ICD-10-CM | POA: Diagnosis not present

## 2020-07-11 DIAGNOSIS — E875 Hyperkalemia: Secondary | ICD-10-CM | POA: Diagnosis not present

## 2020-07-11 DIAGNOSIS — N189 Chronic kidney disease, unspecified: Secondary | ICD-10-CM | POA: Diagnosis not present

## 2020-07-11 DIAGNOSIS — D638 Anemia in other chronic diseases classified elsewhere: Secondary | ICD-10-CM | POA: Diagnosis not present

## 2020-07-11 DIAGNOSIS — E1122 Type 2 diabetes mellitus with diabetic chronic kidney disease: Secondary | ICD-10-CM | POA: Diagnosis not present

## 2020-07-11 DIAGNOSIS — I129 Hypertensive chronic kidney disease with stage 1 through stage 4 chronic kidney disease, or unspecified chronic kidney disease: Secondary | ICD-10-CM | POA: Diagnosis not present

## 2020-07-11 DIAGNOSIS — E1129 Type 2 diabetes mellitus with other diabetic kidney complication: Secondary | ICD-10-CM | POA: Diagnosis not present

## 2020-07-11 DIAGNOSIS — R809 Proteinuria, unspecified: Secondary | ICD-10-CM | POA: Diagnosis not present

## 2020-07-18 ENCOUNTER — Other Ambulatory Visit: Payer: Medicare Other

## 2020-07-18 ENCOUNTER — Other Ambulatory Visit: Payer: Self-pay

## 2020-07-18 DIAGNOSIS — R809 Proteinuria, unspecified: Secondary | ICD-10-CM | POA: Diagnosis not present

## 2020-07-18 DIAGNOSIS — E1122 Type 2 diabetes mellitus with diabetic chronic kidney disease: Secondary | ICD-10-CM | POA: Diagnosis not present

## 2020-07-18 DIAGNOSIS — E1129 Type 2 diabetes mellitus with other diabetic kidney complication: Secondary | ICD-10-CM | POA: Diagnosis not present

## 2020-07-18 DIAGNOSIS — N189 Chronic kidney disease, unspecified: Secondary | ICD-10-CM | POA: Diagnosis not present

## 2020-07-18 DIAGNOSIS — I129 Hypertensive chronic kidney disease with stage 1 through stage 4 chronic kidney disease, or unspecified chronic kidney disease: Secondary | ICD-10-CM | POA: Diagnosis not present

## 2020-07-27 DIAGNOSIS — H43823 Vitreomacular adhesion, bilateral: Secondary | ICD-10-CM | POA: Diagnosis not present

## 2020-07-27 DIAGNOSIS — E113313 Type 2 diabetes mellitus with moderate nonproliferative diabetic retinopathy with macular edema, bilateral: Secondary | ICD-10-CM | POA: Diagnosis not present

## 2020-07-27 DIAGNOSIS — H35033 Hypertensive retinopathy, bilateral: Secondary | ICD-10-CM | POA: Diagnosis not present

## 2020-08-15 ENCOUNTER — Other Ambulatory Visit: Payer: Self-pay

## 2020-08-15 ENCOUNTER — Other Ambulatory Visit: Payer: Medicare Other

## 2020-08-15 DIAGNOSIS — N1832 Chronic kidney disease, stage 3b: Secondary | ICD-10-CM | POA: Diagnosis not present

## 2020-08-15 DIAGNOSIS — E039 Hypothyroidism, unspecified: Secondary | ICD-10-CM | POA: Diagnosis not present

## 2020-08-15 DIAGNOSIS — Z794 Long term (current) use of insulin: Secondary | ICD-10-CM | POA: Diagnosis not present

## 2020-08-15 DIAGNOSIS — E1122 Type 2 diabetes mellitus with diabetic chronic kidney disease: Secondary | ICD-10-CM | POA: Diagnosis not present

## 2020-08-15 DIAGNOSIS — E559 Vitamin D deficiency, unspecified: Secondary | ICD-10-CM | POA: Diagnosis not present

## 2020-08-16 LAB — COMPREHENSIVE METABOLIC PANEL
ALT: 17 IU/L (ref 0–44)
AST: 14 IU/L (ref 0–40)
Albumin/Globulin Ratio: 2.3 — ABNORMAL HIGH (ref 1.2–2.2)
Albumin: 4.4 g/dL (ref 3.8–4.8)
Alkaline Phosphatase: 71 IU/L (ref 44–121)
BUN/Creatinine Ratio: 17 (ref 10–24)
BUN: 37 mg/dL — ABNORMAL HIGH (ref 8–27)
Bilirubin Total: 0.5 mg/dL (ref 0.0–1.2)
CO2: 20 mmol/L (ref 20–29)
Calcium: 8.9 mg/dL (ref 8.6–10.2)
Chloride: 107 mmol/L — ABNORMAL HIGH (ref 96–106)
Creatinine, Ser: 2.19 mg/dL — ABNORMAL HIGH (ref 0.76–1.27)
Globulin, Total: 1.9 g/dL (ref 1.5–4.5)
Glucose: 185 mg/dL — ABNORMAL HIGH (ref 65–99)
Potassium: 6 mmol/L — ABNORMAL HIGH (ref 3.5–5.2)
Sodium: 142 mmol/L (ref 134–144)
Total Protein: 6.3 g/dL (ref 6.0–8.5)
eGFR: 32 mL/min/{1.73_m2} — ABNORMAL LOW (ref >59)

## 2020-08-16 LAB — LIPID PANEL
Chol/HDL Ratio: 4.1 ratio (ref 0.0–5.0)
Cholesterol, Total: 146 mg/dL (ref 100–199)
HDL: 36 mg/dL — ABNORMAL LOW (ref >39)
LDL Chol Calc (NIH): 87 mg/dL (ref 0–99)
Triglycerides: 130 mg/dL (ref 0–149)
VLDL Cholesterol Cal: 23 mg/dL (ref 5–40)

## 2020-08-16 LAB — TSH: TSH: 3.81 u[IU]/mL (ref 0.450–4.500)

## 2020-08-16 LAB — VITAMIN D 25 HYDROXY (VIT D DEFICIENCY, FRACTURES): Vit D, 25-Hydroxy: 33.3 ng/mL (ref 30.0–100.0)

## 2020-08-16 LAB — T4, FREE: Free T4: 1.22 ng/dL (ref 0.82–1.77)

## 2020-08-20 ENCOUNTER — Encounter: Payer: Self-pay | Admitting: Nurse Practitioner

## 2020-08-20 ENCOUNTER — Ambulatory Visit (INDEPENDENT_AMBULATORY_CARE_PROVIDER_SITE_OTHER): Payer: Medicare Other | Admitting: Nurse Practitioner

## 2020-08-20 VITALS — Ht 69.0 in | Wt 212.0 lb

## 2020-08-20 DIAGNOSIS — N1832 Chronic kidney disease, stage 3b: Secondary | ICD-10-CM | POA: Diagnosis not present

## 2020-08-20 DIAGNOSIS — E1122 Type 2 diabetes mellitus with diabetic chronic kidney disease: Secondary | ICD-10-CM | POA: Diagnosis not present

## 2020-08-20 DIAGNOSIS — Z794 Long term (current) use of insulin: Secondary | ICD-10-CM | POA: Diagnosis not present

## 2020-08-20 DIAGNOSIS — E039 Hypothyroidism, unspecified: Secondary | ICD-10-CM

## 2020-08-20 LAB — POCT GLYCOSYLATED HEMOGLOBIN (HGB A1C): HbA1c, POC (controlled diabetic range): 6.1 % (ref 0.0–7.0)

## 2020-08-20 LAB — POCT UA - MICROALBUMIN
Creatinine, POC: 300 mg/dL
Microalbumin Ur, POC: 150 mg/L

## 2020-08-20 MED ORDER — LEVOTHYROXINE SODIUM 25 MCG PO TABS: 25.0000 ug | ORAL_TABLET | Freq: Every day | ORAL | 3 refills | Status: DC

## 2020-08-20 MED ORDER — LEVOTHYROXINE SODIUM 50 MCG PO TABS: 25.0000 ug | ORAL_TABLET | Freq: Every day | ORAL | 3 refills | Status: DC

## 2020-08-20 NOTE — Patient Instructions (Signed)

## 2020-08-20 NOTE — Progress Notes (Signed)
08/20/2020, 2:28 PM  Endocrinology follow-up note   Subjective:    Patient ID: Anthony French, male    DOB: Mar 20, 1955.  Anthony French is being seen in follow-up after he was seen in consultation for management of currently uncontrolled symptomatic diabetes requested by  Janora Norlander, DO.   Past Medical History:  Diagnosis Date   Diabetes ([place])    Diabetic neuropathy ([place])    Essential hypertension, benign 06/01/2019   Hyperlipidemia     Past Surgical History:  Procedure Laterality Date   APPENDECTOMY  1980   CYST REMOVAL TRUNK      Social History   Socioeconomic History   Marital status: Single    Spouse name: Not on file   Number of children: 3   Years of education: Not on file   Highest education level: Not on file  Occupational History   Occupation: retired  Tobacco Use   Smoking status: Never   Smokeless tobacco: Never  Vaping Use   Vaping Use: Never used  Substance and Sexual Activity   Alcohol use: Not Currently   Drug use: Never   Sexual activity: Not Currently  Other Topics Concern   Not on file  Social History Narrative   Not on file   Social Determinants of Health   Financial Resource Strain: Not on file  Food Insecurity: Not on file  Transportation Needs: Not on file  Physical Activity: Not on file  Stress: Not on file  Social Connections: Not on file    Family History  Problem Relation Age of Onset   Stroke Mother    Diabetes Mother    Hypertension Mother    Hyperlipidemia Mother    Kidney disease Mother    COPD Father    Diabetes Sister    Stroke Brother    Diabetes Brother    Arthritis Maternal Grandmother     Outpatient Encounter Medications as of 08/20/2020  Medication Sig   amLODipine (NORVASC) 5 MG tablet Take 1 tablet (5 mg total) by mouth daily as needed (blood pressure above 150/90.).   atorvastatin (LIPITOR) 40 MG tablet TAKE 1 TABLET BY MOUTH  DAILY   blood glucose meter kit and supplies  Dispense based on patient and insurance preference. Use up to four times daily as directed. (FOR ICD-10 E10.9, E11.9). Check blood sugar twice a day as directed.   Continuous Blood Gluc Receiver (FREESTYLE LIBRE 2 READER) DEVI Use to test blood sugars up to 6 times daily. DX: E11.9   Continuous Blood Gluc Sensor (FREESTYLE LIBRE 2 SENSOR) MISC Use to test blood sugars up to 6 times daily. DX: E11.9   Efinaconazole (JUBLIA) 10 % SOLN Apply 1 application topically daily.   gabapentin (NEURONTIN) 300 MG capsule TAKE 1 CAPSULE BY MOUTH IN  THE MORNING AND TAKE 3  CAPSULES BY MOUTH IN THE  EVENING   glipiZIDE (GLUCOTROL XL) 5 MG 24 hr tablet TAKE 1 TABLET BY MOUTH  DAILY WITH BREAKFAST   glucose blood (ACCU-CHEK GUIDE) test strip Test blood sugar 4 times per day, before meals and at bedtime.   insulin NPH-regular Human (70-30) 100 UNIT/ML injection Inject 30 Units into the skin 2 (two) times daily before a meal.   Insulin Pen Needle (PEN NEEDLES) 31G X 8 MM MISC 1 each by Does not apply route in the morning and at bedtime.   Insulin Syringe-Needle U-100 (INSULIN SYRINGE .3CC/31GX5/16") 31G X 5/16" 0.3 ML MISC Use with insulin  BID Dx E11.9   levothyroxine (SYNTHROID) 25 MCG tablet Take 1 tablet (25 mcg total) by mouth daily before breakfast.   losartan (COZAAR) 50 MG tablet Take 1 tablet (50 mg total) by mouth daily.   terbinafine (LAMISIL) 250 MG tablet Take 1 tablet (250 mg total) by mouth daily. Come in for labs in 6 weeks   [DISCONTINUED] levothyroxine (SYNTHROID) 25 MCG tablet Take 1 tablet (25 mcg total) by mouth daily before breakfast.   [DISCONTINUED] levothyroxine (SYNTHROID) 50 MCG tablet Take 0.5 tablets (25 mcg total) by mouth daily before breakfast.   No facility-administered encounter medications on file as of 08/20/2020.    ALLERGIES: Allergies  Allergen Reactions   Other Hives    Pt states that he is allergic to an ABT but he can not remember what it is    VACCINATION  STATUS: Immunization History  Administered Date(s) Administered   Fluad Quad(high Dose 65+) 11/02/2019   Influenza,inj,Quad PF,6+ Mos 12/25/2016   Pneumococcal Conjugate-13 12/27/2018   Pneumococcal Polysaccharide-23 04/16/2017   Tdap 08/19/2017   Zoster Recombinat (Shingrix) 07/28/2019    Diabetes He presents for his follow-up diabetic visit. He has type 2 diabetes mellitus. Onset time: He was diagnosed at approximate age of 66 years. His disease course has been improving. There are no hypoglycemic associated symptoms. Pertinent negatives for hypoglycemia include no headaches, nervousness/anxiousness, pallor or tremors. Pertinent negatives for diabetes include no chest pain, no fatigue, no polydipsia, no polyphagia, no polyuria and no weight loss. There are no hypoglycemic complications. Symptoms are stable. Diabetic complications include nephropathy, peripheral neuropathy and retinopathy. Risk factors for coronary artery disease include dyslipidemia, diabetes mellitus, hypertension, male sex, sedentary lifestyle and family history. Current diabetic treatment includes insulin injections and oral agent (monotherapy). He is compliant with treatment most of the time. His weight is increasing steadily. He is following a generally healthy diet. When asked about meal planning, he reported none. He has had a previous visit with a dietitian. He never participates in exercise. His home blood glucose trend is fluctuating minimally. His breakfast blood glucose range is generally 140-180 mg/dl. His lunch blood glucose range is generally 180-200 mg/dl. His dinner blood glucose range is generally 140-180 mg/dl. His bedtime blood glucose range is generally 140-180 mg/dl. His overall blood glucose range is 140-180 mg/dl. (He presents today with his CGM and logs showing increasing glycemic profile over the last week.  His POCT A1c today is 6.1%, improving from last visit of 7.1%.  Analysis of his CGM shows TIR 60%, TAR  40%, TBR 0%.  He reports his diet has slipped over the last week or so but he has plans to get back on track.) An ACE inhibitor/angiotensin II receptor blocker is being taken. He does not see a podiatrist.Eye exam is current.  Hyperlipidemia This is a chronic problem. The current episode started more than 1 year ago. The problem is controlled. Recent lipid tests were reviewed and are normal. Exacerbating diseases include chronic renal disease, diabetes and hypothyroidism. There are no known factors aggravating his hyperlipidemia. Pertinent negatives include no chest pain, myalgias or shortness of breath. Current antihyperlipidemic treatment includes statins. The current treatment provides significant improvement of lipids. There are no compliance problems.  Risk factors for coronary artery disease include diabetes mellitus, dyslipidemia, hypertension, male sex and a sedentary lifestyle.  Hypertension This is a chronic problem. The current episode started more than 1 year ago. The problem has been gradually improving since onset. The problem is controlled. Pertinent negatives  include no chest pain, headaches, palpitations or shortness of breath. There are no associated agents to hypertension. Risk factors for coronary artery disease include dyslipidemia, diabetes mellitus, family history, male gender and sedentary lifestyle. Past treatments include calcium channel blockers and angiotensin blockers. The current treatment provides mild improvement. There are no compliance problems.  Hypertensive end-organ damage includes kidney disease and retinopathy. Identifiable causes of hypertension include chronic renal disease and a thyroid problem.  Thyroid Problem Presents for follow-up (Abnormality noted with recent thyroid studies.  No prior work-up done, no previous labs to compare to.) visit. Onset time: Unknown. Patient reports no anxiety, cold intolerance, constipation, diarrhea, fatigue, heat intolerance,  palpitations, tremors, weight gain or weight loss. The symptoms have been stable. Past treatments include nothing. His past medical history is significant for diabetes and hyperlipidemia. There are no known risk factors.   Review of systems  Constitutional: + steadily increasing body weight,  current Body mass index is 31.31 kg/m. , no fatigue, no subjective hyperthermia, no subjective hypothermia Eyes: no blurry vision, no xerophthalmia ENT: no sore throat, no nodules palpated in throat, no dysphagia/odynophagia, no hoarseness Cardiovascular: no chest pain, no shortness of breath, no palpitations, no leg swelling Respiratory: no cough, no shortness of breath Gastrointestinal: no nausea/vomiting/diarrhea Musculoskeletal: no muscle/joint aches Skin: no rashes, no hyperemia Neurological: no tremors, no numbness, no tingling, no dizziness Psychiatric: no depression, no anxiety   Objective:    Ht 5' 9" (1.753 m)   Wt 212 lb (96.2 kg)   BMI 31.31 kg/m   Wt Readings from Last 3 Encounters:  08/20/20 212 lb (96.2 kg)  06/06/20 211 lb 6.4 oz (95.9 kg)  04/20/20 207 lb 9.6 oz (94.2 kg)     BP Readings from Last 3 Encounters:  06/06/20 (!) 141/77  04/20/20 138/81  02/07/20 (!) 160/84    Physical Exam- Limited  Constitutional:  Body mass index is 31.31 kg/m. , not in acute distress, normal state of mind Eyes:  EOMI, no exophthalmos Neck: Supple Cardiovascular: RRR, no murmurs, rubs, or gallops, no edema Respiratory: Adequate breathing efforts, no crackles, rales, rhonchi, or wheezing Musculoskeletal: no gross deformities, strength intact in all four extremities, no gross restriction of joint movements Skin:  no rashes, no hyperemia Neurological: no tremor with outstretched hands    CMP ( most recent) CMP     Component Value Date/Time   NA 142 08/15/2020 0817   K 6.0 (H) 08/15/2020 0817   CL 107 (H) 08/15/2020 0817   CO2 20 08/15/2020 0817   GLUCOSE 185 (H) 08/15/2020  0817   GLUCOSE 272 (H) 05/11/2019 1254   BUN 37 (H) 08/15/2020 0817   CREATININE 2.19 (H) 08/15/2020 0817   CALCIUM 8.9 08/15/2020 0817   PROT 6.3 08/15/2020 0817   ALBUMIN 4.4 08/15/2020 0817   AST 14 08/15/2020 0817   ALT 17 08/15/2020 0817   ALKPHOS 71 08/15/2020 0817   BILITOT 0.5 08/15/2020 0817   GFRNONAA 41 (L) 09/13/2019 1420   GFRAA 47 (L) 09/13/2019 1420    Diabetic Labs (most recent): Lab Results  Component Value Date   HGBA1C 6.1 08/20/2020   HGBA1C 7.1 (A) 04/20/2020   HGBA1C 6.8 (A) 12/22/2019     Lipid Panel ( most recent) Lipid Panel     Component Value Date/Time   CHOL 146 08/15/2020 0817   TRIG 130 08/15/2020 0817   HDL 36 (L) 08/15/2020 0817   CHOLHDL 4.1 08/15/2020 0817   LDLCALC 87 08/15/2020 0817   LABVLDL  23 08/15/2020 0817     Assessment & Plan:   1) Type 2 diabetes mellitus with stage 3b chronic kidney disease, with long-term current use of insulin (HCC)  - Carter Kassel has currently uncontrolled symptomatic type 2 DM since  65 years of age.   Recent labs reviewed.  He presents today with his CGM and logs showing increasing glycemic profile over the last week.  His POCT A1c today is 6.1%, improving from last visit of 7.1%.  Analysis of his CGM shows TIR 60%, TAR 40%, TBR 0%.  He reports his diet has slipped over the last week or so but he has plans to get back on track.  - I had a long discussion with him about the progressive nature of diabetes and the pathology behind its complications. -his diabetes is complicated by CKD, peripheral neuropathy and he remains at a high risk for more acute and chronic complications which include CAD, CVA, CKD, retinopathy, and neuropathy. These are all discussed in detail with him.  - Nutritional counseling repeated at each appointment due to patients tendency to fall back in to old habits.  - The patient admits there is a room for improvement in their diet and drink choices. -  Suggestion is made for the  patient to avoid simple carbohydrates from their diet including Cakes, Sweet Desserts / Pastries, Ice Cream, Soda (diet and regular), Sweet Tea, Candies, Chips, Cookies, Sweet Pastries, Store Bought Juices, Alcohol in Excess of 1-2 drinks a day, Artificial Sweeteners, Coffee Creamer, and "Sugar-free" Products. This will help patient to have stable blood glucose profile and potentially avoid unintended weight gain.   - I encouraged the patient to switch to unprocessed or minimally processed complex starch and increased protein intake (animal or plant source), fruits, and vegetables.   - Patient is advised to stick to a routine mealtimes to eat 3 meals a day and avoid unnecessary snacks (to snack only to correct hypoglycemia).  - I have approached him with the following individualized plan to manage  his diabetes and patient agrees:   - he will continue to benefit from simplicity of premixed insulin.   Based on his stable glycemic profile overall, he is advised to continue current medication regimen of Novolin 70/30 30 units with breakfast and supper if glucose is above 90 and he is eating.  He can also continue Glipizide 5 mg XL daily with breakfast.    -He is encouraged to continue using his CGM to monitor blood glucose 4 times per day, before meals and before bed, and call the clinic if he has readings less than 70 or greater than 300 for 3 tests in a row.  - Specific targets for  A1c;  LDL, HDL,  and Triglycerides were discussed with the patient.  2) Blood Pressure /Hypertension:  His blood pressure is controlled to target.  He is advised to continue Cozaar 50 mg po daily as well as Amlodipine 5 mg po daily.  3) Lipids/Hyperlipidemia:  His most recent lipid panel from 09/13/19 shows controlled LDL of 53.  He is advised to continue Atorvastatin 40 mg po daily at bedtime.  Side effects and precautions discussed with him.  Will recheck lipid panel prior to next visit.  4)  Weight/Diet:  His Body  mass index is 31.31 kg/m.     he is a candidate for some weight loss. I discussed with him the fact that loss of 5 - 10% of his  current body weight will have the  most impact on his diabetes management.  Exercise, and detailed carbohydrates information provided  -  detailed on discharge instructions.  5)Hypothyroidism-acquired: His previsit thyroid function tests are consistent with slight under-replacement, however he reports he has been taking his thyroid pill along with his other medications in the morning making decreased absorption the issue.  He is advised to continue Levothyroxine 25 mcg po daily before breakfast for now.  Will recheck TFTs prior to next visit.    - The correct intake of thyroid hormone (Levothyroxine, Synthroid), is on empty stomach first thing in the morning, with water, separated by at least 30 minutes from breakfast and other medications,  and separated by more than 4 hours from calcium, iron, multivitamins, acid reflux medications (PPIs).  - This medication is a life-long medication and will be needed to correct thyroid hormone imbalances for the rest of your life.  The dose may change from time to time, based on thyroid blood work.  - It is extremely important to be consistent taking this medication, near the same time each morning.  -AVOID TAKING PRODUCTS CONTAINING BIOTIN (commonly found in Hair, Skin, Nails vitamins) AS IT INTERFERES WITH THE VALIDITY OF THYROID FUNCTION BLOOD TESTS.  6) Chronic Care/Health Maintenance: -he is on ARB and Statin medications and  is encouraged to initiate and continue to follow up with Ophthalmology, Dentist,  Podiatrist at least yearly or according to recommendations, and advised to stay away from smoking. I have recommended yearly flu vaccine and pneumonia vaccine at least every 5 years; moderate intensity exercise for up to 150 minutes weekly; and  sleep for at least 7 hours a day.  - he is advised to maintain close follow up with  Janora Norlander, DO for primary care needs, as well as his other providers for optimal and coordinated care.     I spent 32 minutes in the care of the patient today including review of labs from [facility], Lipids, Thyroid Function, Hematology (current and previous including abstractions from other facilities); face-to-face time discussing  his blood glucose readings/logs, discussing hypoglycemia and hyperglycemia episodes and symptoms, medications doses, his options of short and long term treatment based on the latest standards of care / guidelines;  discussion about incorporating lifestyle medicine;  and documenting the encounter.    Please refer to Patient Instructions for Blood Glucose Monitoring and Insulin/Medications Dosing Guide"  in media tab for additional information. Please  also refer to " Patient Self Inventory" in the Media  tab for reviewed elements of pertinent patient history.  [name] participated in the discussions, expressed understanding, and voiced agreement with the above plans.  All questions were answered to his satisfaction. he is encouraged to contact clinic should he have any questions or concerns prior to his return visit.   Follow up plan: - Return in about 4 months (around 12/21/2020) for Diabetes F/U with A1c in office, Thyroid follow up, Previsit labs, Bring meter and logs.  Rayetta Pigg, [facility] [address] [place], [place] [address] Phone: [phone] Fax: [phone]   08/20/2020, 2:28 PM

## 2020-08-27 ENCOUNTER — Ambulatory Visit (INDEPENDENT_AMBULATORY_CARE_PROVIDER_SITE_OTHER): Payer: Medicare Other | Admitting: Podiatry

## 2020-08-27 ENCOUNTER — Other Ambulatory Visit: Payer: Self-pay

## 2020-08-27 DIAGNOSIS — B351 Tinea unguium: Secondary | ICD-10-CM | POA: Diagnosis not present

## 2020-08-27 MED ORDER — FLUCONAZOLE 150 MG PO TABS: 150.0000 mg | ORAL_TABLET | ORAL | 0 refills | Status: AC

## 2020-08-27 NOTE — Progress Notes (Signed)
  Subjective:  Patient ID: Anthony French, male    DOB: 23-May-1955,  MRN: [id number]  Chief Complaint  Patient presents with   Nail Problem      Onychomycosis of great toe    65 y.o. male presents with the above complaint. History confirmed with patient.  He is doing well has been using the Jublia has been out of it and the pharmacy will not refill it until July 27  Objective:  Physical Exam: warm, good capillary refill, no trophic changes or ulcerative lesions, normal DP and PT pulses, and normal sensory exam. Left Foot: Hallux nail with approximately 25% improvement since last visit   [place] pathology with yeast and Aspergillus species Assessment:   1. Onychomycosis      Plan:  Patient was evaluated and treated and all questions answered.  Doing well I think we should continue the Jublia treatment.  Also discussed with him the results of the nail biopsy.  I recommend adding fluconazole weekly pulsed therapy for 6 months.  Prescription sent to pharmacy.  We will follow-up with him then.  Return in about 6 months (around 02/27/2021) for follow up after nail fungus treatment.

## 2020-08-30 DIAGNOSIS — E113313 Type 2 diabetes mellitus with moderate nonproliferative diabetic retinopathy with macular edema, bilateral: Secondary | ICD-10-CM | POA: Diagnosis not present

## 2020-09-07 ENCOUNTER — Other Ambulatory Visit: Payer: Self-pay

## 2020-09-07 ENCOUNTER — Ambulatory Visit (INDEPENDENT_AMBULATORY_CARE_PROVIDER_SITE_OTHER): Payer: Medicare Other | Admitting: Family Medicine

## 2020-09-07 ENCOUNTER — Encounter: Payer: Self-pay | Admitting: Family Medicine

## 2020-09-07 VITALS — BP 133/85 | HR 63 | Temp 97.4°F | Ht 69.0 in | Wt 211.4 lb

## 2020-09-07 DIAGNOSIS — Z794 Long term (current) use of insulin: Secondary | ICD-10-CM | POA: Diagnosis not present

## 2020-09-07 DIAGNOSIS — E875 Hyperkalemia: Secondary | ICD-10-CM | POA: Diagnosis not present

## 2020-09-07 DIAGNOSIS — I129 Hypertensive chronic kidney disease with stage 1 through stage 4 chronic kidney disease, or unspecified chronic kidney disease: Secondary | ICD-10-CM | POA: Diagnosis not present

## 2020-09-07 DIAGNOSIS — N189 Chronic kidney disease, unspecified: Secondary | ICD-10-CM | POA: Diagnosis not present

## 2020-09-07 DIAGNOSIS — E1122 Type 2 diabetes mellitus with diabetic chronic kidney disease: Secondary | ICD-10-CM

## 2020-09-07 DIAGNOSIS — Z23 Encounter for immunization: Secondary | ICD-10-CM

## 2020-09-07 DIAGNOSIS — N1832 Chronic kidney disease, stage 3b: Secondary | ICD-10-CM | POA: Diagnosis not present

## 2020-09-07 DIAGNOSIS — E1129 Type 2 diabetes mellitus with other diabetic kidney complication: Secondary | ICD-10-CM | POA: Diagnosis not present

## 2020-09-07 DIAGNOSIS — R809 Proteinuria, unspecified: Secondary | ICD-10-CM | POA: Diagnosis not present

## 2020-09-07 NOTE — Progress Notes (Addendum)
Subjective: CC: DM, hyperkalemia PCP: Janora Norlander, DO LNL:[id number] Anthony French is a 65 y.o. male presenting to clinic today for:  1. Type 2 Diabetes with hypertension, hyperlipidemia and CKD:  DM managed by Dr Dorris Fetch Rayetta Pigg.  He was noted to be hyperkalemic to 6.0 on 08/15/20. Last OV with Dr Theador Hawthorne was 07/2020.  His A1c showed good control at 6.1 on his most recent visit.  He is treated with [facility] for his hyperkalemia.  No reports of heart palpitations or confusion.  Had a recent checkup with his home health insurance nurse.  Surprisingly A1c on that visit was 7.1 but this preceded his most recent visit with endocrinology.  He continues to follow-up with ophthalmology as scheduled.  Due for second Shingrix vaccine today.  No reports of chest pain, shortness of breath or dizziness.  Currently utilizing a freestyle Elenor Legato which is really helped him manage his blood sugars  Last eye exam: Up-to-date Last foot exam: Needs Last A1c:  Lab Results  Component Value Date   HGBA1C 6.1 08/20/2020   Nephropathy screen indicated?:  Up-to-date Last flu, zoster and/or pneumovax:  Immunization History  Administered Date(s) Administered   Fluad Quad(high Dose 65+) 11/02/2019   Influenza,inj,Quad PF,6+ Mos 12/25/2016   Pneumococcal Conjugate-13 12/27/2018   Pneumococcal Polysaccharide-23 04/16/2017   Tdap 08/19/2017   Zoster Recombinat (Shingrix) 07/28/2019     ROS: Per HPI  Allergies  Allergen Reactions   Other Hives    Pt states that he is allergic to an ABT but he can not remember what it is   Past Medical History:  Diagnosis Date   Diabetes ([place])    Diabetic neuropathy ([place])    Essential hypertension, benign 06/01/2019   Hyperlipidemia     Current Outpatient Medications:    amLODipine (NORVASC) 5 MG tablet, Take 1 tablet (5 mg total) by mouth daily as needed (blood pressure above 150/90.)., Disp: 90 tablet, Rfl: 3   atorvastatin (LIPITOR) 40 MG tablet, TAKE 1 TABLET BY MOUTH   DAILY, Disp: 90 tablet, Rfl: 1   blood glucose meter kit and supplies, Dispense based on patient and insurance preference. Use up to four times daily as directed. (FOR ICD-10 E10.9, E11.9). Check blood sugar twice a day as directed., Disp: 1 each, Rfl: 0   Continuous Blood Gluc Receiver (FREESTYLE LIBRE 2 READER) DEVI, Use to test blood sugars up to 6 times daily. DX: E11.9, Disp: 1 each, Rfl: 0   Continuous Blood Gluc Sensor (FREESTYLE LIBRE 2 SENSOR) MISC, Use to test blood sugars up to 6 times daily. DX: E11.9, Disp: 6 each, Rfl: 4   Efinaconazole (JUBLIA) 10 % SOLN, Apply 1 application topically daily., Disp: 4 mL, Rfl: 5   fluconazole (DIFLUCAN) 150 MG tablet, Take 1 tablet (150 mg total) by mouth once a week for 26 doses., Disp: 26 tablet, Rfl: 0   gabapentin (NEURONTIN) 300 MG capsule, TAKE 1 CAPSULE BY MOUTH IN  THE MORNING AND TAKE 3  CAPSULES BY MOUTH IN THE  EVENING, Disp: 360 capsule, Rfl: 0   glipiZIDE (GLUCOTROL XL) 5 MG 24 hr tablet, TAKE 1 TABLET BY MOUTH  DAILY WITH BREAKFAST, Disp: 90 tablet, Rfl: 0   glucose blood (ACCU-CHEK GUIDE) test strip, Test blood sugar 4 times per day, before meals and at bedtime., Disp: 500 strip, Rfl: 3   insulin NPH-regular Human (70-30) 100 UNIT/ML injection, Inject 30 Units into the skin 2 (two) times daily before a meal., Disp: , Rfl:  Insulin Pen Needle (PEN NEEDLES) 31G X 8 MM MISC, 1 each by Does not apply route in the morning and at bedtime., Disp: 100 each, Rfl: 11   Insulin Syringe-Needle U-100 (INSULIN SYRINGE .3CC/31GX5/16") 31G X 5/16" 0.3 ML MISC, Use with insulin BID Dx E11.9, Disp: 200 each, Rfl: 3   levothyroxine (SYNTHROID) 25 MCG tablet, Take 1 tablet (25 mcg total) by mouth daily before breakfast., Disp: 30 tablet, Rfl: 3   losartan (COZAAR) 50 MG tablet, Take 1 tablet (50 mg total) by mouth daily., Disp: 90 tablet, Rfl: 3   terbinafine (LAMISIL) 250 MG tablet, Take 1 tablet (250 mg total) by mouth daily. Come in for labs in 6 weeks,  Disp: 90 tablet, Rfl: 0 Social History   Socioeconomic History   Marital status: Single    Spouse name: Not on file   Number of children: 3   Years of education: Not on file   Highest education level: Not on file  Occupational History   Occupation: retired  Tobacco Use   Smoking status: Never   Smokeless tobacco: Never  Vaping Use   Vaping Use: Never used  Substance and Sexual Activity   Alcohol use: Not Currently   Drug use: Never   Sexual activity: Not Currently  Other Topics Concern   Not on file  Social History Narrative   Not on file   Social Determinants of Health   Financial Resource Strain: Not on file  Food Insecurity: Not on file  Transportation Needs: Not on file  Physical Activity: Not on file  Stress: Not on file  Social Connections: Not on file  Intimate Partner Violence: Not on file   Family History  Problem Relation Age of Onset   Stroke Mother    Diabetes Mother    Hypertension Mother    Hyperlipidemia Mother    Kidney disease Mother    COPD Father    Diabetes Sister    Stroke Brother    Diabetes Brother    Arthritis Maternal Grandmother     Objective: Office vital signs reviewed. BP 133/85   Pulse 63   Temp (!) 97.4 F (36.3 C)   Ht 5' 9" (1.753 m)   Wt 211 lb 6.4 oz (95.9 kg)   SpO2 96%   BMI 31.22 kg/m   Physical Examination:  General: Awake, alert, well nourished, No acute distress HEENT: Normal; sclera white Cardio: regular rate and rhythm, S1S2 heard, no murmurs appreciated Pulm: clear to auscultation bilaterally, no wheezes, rhonchi or rales; normal work of breathing on room air Extremities: warm, well perfused, No edema, cyanosis or clubbing; +2 pulses bilaterally  Diabetic Foot Exam - Simple   Simple Foot Form Diabetic Foot exam was performed with the following findings: Yes 09/07/2020  2:04 PM  Visual Inspection See comments: Yes Sensation Testing Intact to touch and monofilament testing bilaterally: Yes Pulse  Check Posterior Tibialis and Dorsalis pulse intact bilaterally: Yes Comments He has onychomycotic changes to the left great toenail      Assessment/ Plan: 65 y.o. male   Hyperkalemia - Plan: Basic Metabolic Panel, CANCELED: Basic Metabolic Panel  Type 2 diabetes mellitus with stage 3b chronic kidney disease, with long-term current use of insulin ([place])  Treated with Lokelma.  Recheck BMP through Dr. Theador Hawthorne.  Diabetic foot exam performed today.  Continue follow-up with specialist as directed.  Sounds to be doing extremely well.  Recommend CGM for blood sugar monitoring in this patient with CKD 3B, electrolyte derangements and insulin  dependence  Second shingles shot administered today  No orders of the defined types were placed in this encounter.  No orders of the defined types were placed in this encounter.    Janora Norlander, DO [place] [phone]

## 2020-09-07 NOTE — Patient Instructions (Signed)
Return a copy of your Advanced directive next visit.  You are up to date on all the other things on that sheet with the insurance nurse.

## 2020-09-08 DIAGNOSIS — E1122 Type 2 diabetes mellitus with diabetic chronic kidney disease: Secondary | ICD-10-CM | POA: Diagnosis not present

## 2020-09-08 DIAGNOSIS — Z79899 Other long term (current) drug therapy: Secondary | ICD-10-CM | POA: Diagnosis not present

## 2020-09-08 DIAGNOSIS — Z882 Allergy status to sulfonamides status: Secondary | ICD-10-CM | POA: Diagnosis not present

## 2020-09-08 DIAGNOSIS — N179 Acute kidney failure, unspecified: Secondary | ICD-10-CM | POA: Diagnosis not present

## 2020-09-08 DIAGNOSIS — Z794 Long term (current) use of insulin: Secondary | ICD-10-CM | POA: Diagnosis not present

## 2020-09-08 DIAGNOSIS — E875 Hyperkalemia: Secondary | ICD-10-CM | POA: Diagnosis not present

## 2020-09-08 DIAGNOSIS — N189 Chronic kidney disease, unspecified: Secondary | ICD-10-CM | POA: Diagnosis not present

## 2020-09-10 ENCOUNTER — Other Ambulatory Visit: Payer: Medicare Other

## 2020-09-10 ENCOUNTER — Other Ambulatory Visit: Payer: Self-pay

## 2020-09-10 DIAGNOSIS — E875 Hyperkalemia: Secondary | ICD-10-CM | POA: Diagnosis not present

## 2020-09-10 LAB — BASIC METABOLIC PANEL
BUN/Creatinine Ratio: 21 (ref 10–24)
BUN: 57 mg/dL — ABNORMAL HIGH (ref 8–27)
CO2: 19 mmol/L — ABNORMAL LOW (ref 20–29)
Calcium: 9 mg/dL (ref 8.6–10.2)
Chloride: 109 mmol/L — ABNORMAL HIGH (ref 96–106)
Creatinine, Ser: 2.72 mg/dL — ABNORMAL HIGH (ref 0.76–1.27)
Glucose: 215 mg/dL — ABNORMAL HIGH (ref 65–99)
Potassium: 6 mmol/L (ref 3.5–5.2)
Sodium: 145 mmol/L — ABNORMAL HIGH (ref 134–144)
eGFR: 25 mL/min/{1.73_m2} — ABNORMAL LOW (ref >59)

## 2020-09-14 ENCOUNTER — Other Ambulatory Visit (HOSPITAL_COMMUNITY): Payer: Self-pay | Admitting: Nephrology

## 2020-09-14 DIAGNOSIS — E1129 Type 2 diabetes mellitus with other diabetic kidney complication: Secondary | ICD-10-CM | POA: Diagnosis not present

## 2020-09-14 DIAGNOSIS — N189 Chronic kidney disease, unspecified: Secondary | ICD-10-CM | POA: Diagnosis not present

## 2020-09-14 DIAGNOSIS — N170 Acute kidney failure with tubular necrosis: Secondary | ICD-10-CM

## 2020-09-14 DIAGNOSIS — E1122 Type 2 diabetes mellitus with diabetic chronic kidney disease: Secondary | ICD-10-CM | POA: Diagnosis not present

## 2020-09-14 DIAGNOSIS — R809 Proteinuria, unspecified: Secondary | ICD-10-CM | POA: Diagnosis not present

## 2020-09-14 DIAGNOSIS — I129 Hypertensive chronic kidney disease with stage 1 through stage 4 chronic kidney disease, or unspecified chronic kidney disease: Secondary | ICD-10-CM | POA: Diagnosis not present

## 2020-09-14 DIAGNOSIS — E875 Hyperkalemia: Secondary | ICD-10-CM | POA: Diagnosis not present

## 2020-09-17 ENCOUNTER — Telehealth: Payer: Self-pay | Admitting: *Deleted

## 2020-09-17 NOTE — Telephone Encounter (Signed)
Patient is calling because he said that the nail that has been worked on is now loose.Please schedule for upcoming appointment.

## 2020-09-18 ENCOUNTER — Ambulatory Visit (INDEPENDENT_AMBULATORY_CARE_PROVIDER_SITE_OTHER): Payer: Medicare Other | Admitting: Podiatry

## 2020-09-18 ENCOUNTER — Other Ambulatory Visit: Payer: Self-pay

## 2020-09-18 DIAGNOSIS — E1122 Type 2 diabetes mellitus with diabetic chronic kidney disease: Secondary | ICD-10-CM | POA: Diagnosis not present

## 2020-09-18 DIAGNOSIS — M79674 Pain in right toe(s): Secondary | ICD-10-CM

## 2020-09-18 DIAGNOSIS — B351 Tinea unguium: Secondary | ICD-10-CM

## 2020-09-18 DIAGNOSIS — M79675 Pain in left toe(s): Secondary | ICD-10-CM

## 2020-09-18 DIAGNOSIS — N1832 Chronic kidney disease, stage 3b: Secondary | ICD-10-CM | POA: Diagnosis not present

## 2020-09-18 DIAGNOSIS — Z794 Long term (current) use of insulin: Secondary | ICD-10-CM | POA: Diagnosis not present

## 2020-09-19 ENCOUNTER — Other Ambulatory Visit (HOSPITAL_COMMUNITY)
Admission: RE | Admit: 2020-09-19 | Discharge: 2020-09-19 | Disposition: A | Discharge status: Home / Self Care | Payer: Medicare Other | Source: Ambulatory Visit | Attending: Nephrology | Admitting: Nephrology

## 2020-09-19 ENCOUNTER — Ambulatory Visit (HOSPITAL_COMMUNITY)
Admission: RE | Admit: 2020-09-19 | Discharge: 2020-09-19 | Disposition: A | Discharge status: Home / Self Care | Payer: Medicare Other | Source: Ambulatory Visit | Attending: Nephrology | Admitting: Nephrology

## 2020-09-19 DIAGNOSIS — R809 Proteinuria, unspecified: Secondary | ICD-10-CM | POA: Insufficient documentation

## 2020-09-19 DIAGNOSIS — N170 Acute kidney failure with tubular necrosis: Secondary | ICD-10-CM | POA: Insufficient documentation

## 2020-09-19 DIAGNOSIS — N189 Chronic kidney disease, unspecified: Secondary | ICD-10-CM | POA: Insufficient documentation

## 2020-09-19 DIAGNOSIS — N179 Acute kidney failure, unspecified: Secondary | ICD-10-CM | POA: Diagnosis not present

## 2020-09-19 DIAGNOSIS — E1129 Type 2 diabetes mellitus with other diabetic kidney complication: Secondary | ICD-10-CM | POA: Insufficient documentation

## 2020-09-19 DIAGNOSIS — E1122 Type 2 diabetes mellitus with diabetic chronic kidney disease: Secondary | ICD-10-CM | POA: Insufficient documentation

## 2020-09-19 DIAGNOSIS — E875 Hyperkalemia: Secondary | ICD-10-CM | POA: Insufficient documentation

## 2020-09-19 DIAGNOSIS — K760 Fatty (change of) liver, not elsewhere classified: Secondary | ICD-10-CM | POA: Diagnosis not present

## 2020-09-19 DIAGNOSIS — I129 Hypertensive chronic kidney disease with stage 1 through stage 4 chronic kidney disease, or unspecified chronic kidney disease: Secondary | ICD-10-CM | POA: Insufficient documentation

## 2020-09-19 LAB — RENAL FUNCTION PANEL
Albumin: 3.9 g/dL (ref 3.5–5.0)
Anion gap: 8 (ref 5–15)
BUN: 37 mg/dL — ABNORMAL HIGH (ref 8–23)
CO2: 24 mmol/L (ref 22–32)
Calcium: 8.7 mg/dL — ABNORMAL LOW (ref 8.9–10.3)
Chloride: 107 mmol/L (ref 98–111)
Creatinine, Ser: 2.15 mg/dL — ABNORMAL HIGH (ref 0.61–1.24)
GFR, Estimated: 33 mL/min — ABNORMAL LOW (ref >60)
Glucose, Bld: 232 mg/dL — ABNORMAL HIGH (ref 70–99)
Phosphorus: 3.3 mg/dL (ref 2.5–4.6)
Potassium: 4.9 mmol/L (ref 3.5–5.1)
Sodium: 139 mmol/L (ref 135–145)

## 2020-09-19 NOTE — Progress Notes (Signed)
  Subjective:  Patient ID: Anthony French, male    DOB: Jan 23, 1956,  MRN: [id number]  Chief Complaint  Patient presents with   Nail Problem      Onychomycosis of great toe    65 y.o. male presents with the above complaint. History confirmed with patient.  His left hallux nail has become loose and is coming off  Objective:  Physical Exam: warm, good capillary refill, no trophic changes or ulcerative lesions, normal DP and PT pulses, and normal sensory exam. Left Foot: Hallux nail with approximately 25% improvement since last visit  Left hallux nail loose and peeling  Bako pathology with yeast and Aspergillus species Assessment:   1. Pain due to onychomycosis of toenails of both feet   2. Type 2 diabetes mellitus with stage 3b chronic kidney disease, with long-term current use of insulin ([place])      Plan:  Patient was evaluated and treated and all questions answered.  Debrided the left hallux nail he will continue the Jublia  Return if symptoms worsen or fail to improve.

## 2020-09-21 DIAGNOSIS — I129 Hypertensive chronic kidney disease with stage 1 through stage 4 chronic kidney disease, or unspecified chronic kidney disease: Secondary | ICD-10-CM | POA: Diagnosis not present

## 2020-09-21 DIAGNOSIS — E1129 Type 2 diabetes mellitus with other diabetic kidney complication: Secondary | ICD-10-CM | POA: Diagnosis not present

## 2020-09-21 DIAGNOSIS — E875 Hyperkalemia: Secondary | ICD-10-CM | POA: Diagnosis not present

## 2020-09-21 DIAGNOSIS — R809 Proteinuria, unspecified: Secondary | ICD-10-CM | POA: Diagnosis not present

## 2020-09-21 DIAGNOSIS — N189 Chronic kidney disease, unspecified: Secondary | ICD-10-CM | POA: Diagnosis not present

## 2020-09-21 DIAGNOSIS — R9341 Abnormal radiologic findings on diagnostic imaging of renal pelvis, ureter, or bladder: Secondary | ICD-10-CM | POA: Diagnosis not present

## 2020-09-21 DIAGNOSIS — N170 Acute kidney failure with tubular necrosis: Secondary | ICD-10-CM | POA: Diagnosis not present

## 2020-09-21 DIAGNOSIS — E1122 Type 2 diabetes mellitus with diabetic chronic kidney disease: Secondary | ICD-10-CM | POA: Diagnosis not present

## 2020-09-21 DIAGNOSIS — K760 Fatty (change of) liver, not elsewhere classified: Secondary | ICD-10-CM | POA: Diagnosis not present

## 2020-09-26 DIAGNOSIS — E119 Type 2 diabetes mellitus without complications: Secondary | ICD-10-CM | POA: Diagnosis not present

## 2020-09-26 DIAGNOSIS — Z794 Long term (current) use of insulin: Secondary | ICD-10-CM | POA: Diagnosis not present

## 2020-10-04 DIAGNOSIS — E113313 Type 2 diabetes mellitus with moderate nonproliferative diabetic retinopathy with macular edema, bilateral: Secondary | ICD-10-CM | POA: Diagnosis not present

## 2020-10-19 ENCOUNTER — Telehealth: Payer: Self-pay | Admitting: Family Medicine

## 2020-10-19 NOTE — Telephone Encounter (Signed)
Yes please have him check BP after being seated for 15 minutes quietly.  If persistently elevated would like him to be seen in the office for blood pressure check with nurse

## 2020-10-19 NOTE — Telephone Encounter (Signed)
Pt's wife is calling because they want to discuss some of the pt's medications.

## 2020-10-19 NOTE — Telephone Encounter (Signed)
Can you provide details of what patient needs/if urgent?   I would like to schedule him as CCM.   Fully booked today, but may be able to squeeze in this afternoon

## 2020-10-19 NOTE — Telephone Encounter (Signed)
Not sure how Almyra Free became involved but when speaking to pts wife she is concerned that pt needs to start back on his BP meds. For the past week numbers have been elevated and pt has had a slight HA. BP now is 173/72. Pt has no other symptoms.   Wife states that kidney specialist took pt off of BP med she thinks due to an elevated Potassium. They are currently driving down the road. Instructed that it would be best if he could lye down in a cool dark room and re check BP.

## 2020-10-19 NOTE — Telephone Encounter (Signed)
Wife aware

## 2020-10-26 ENCOUNTER — Other Ambulatory Visit: Payer: Self-pay

## 2020-10-26 ENCOUNTER — Other Ambulatory Visit: Payer: Self-pay | Admitting: Family Medicine

## 2020-10-26 ENCOUNTER — Other Ambulatory Visit: Payer: Medicare Other

## 2020-10-26 DIAGNOSIS — E039 Hypothyroidism, unspecified: Secondary | ICD-10-CM | POA: Diagnosis not present

## 2020-10-26 DIAGNOSIS — N189 Chronic kidney disease, unspecified: Secondary | ICD-10-CM | POA: Diagnosis not present

## 2020-10-26 DIAGNOSIS — E1122 Type 2 diabetes mellitus with diabetic chronic kidney disease: Secondary | ICD-10-CM | POA: Diagnosis not present

## 2020-10-27 LAB — TSH: TSH: 2.88 u[IU]/mL (ref 0.450–4.500)

## 2020-10-27 LAB — T4, FREE: Free T4: 1.2 ng/dL (ref 0.82–1.77)

## 2020-10-31 DIAGNOSIS — E1122 Type 2 diabetes mellitus with diabetic chronic kidney disease: Secondary | ICD-10-CM | POA: Diagnosis not present

## 2020-10-31 DIAGNOSIS — R809 Proteinuria, unspecified: Secondary | ICD-10-CM | POA: Diagnosis not present

## 2020-10-31 DIAGNOSIS — E875 Hyperkalemia: Secondary | ICD-10-CM | POA: Diagnosis not present

## 2020-10-31 DIAGNOSIS — E1129 Type 2 diabetes mellitus with other diabetic kidney complication: Secondary | ICD-10-CM | POA: Diagnosis not present

## 2020-10-31 DIAGNOSIS — I129 Hypertensive chronic kidney disease with stage 1 through stage 4 chronic kidney disease, or unspecified chronic kidney disease: Secondary | ICD-10-CM | POA: Diagnosis not present

## 2020-10-31 DIAGNOSIS — N189 Chronic kidney disease, unspecified: Secondary | ICD-10-CM | POA: Diagnosis not present

## 2020-10-31 DIAGNOSIS — E872 Acidosis: Secondary | ICD-10-CM | POA: Diagnosis not present

## 2020-11-06 ENCOUNTER — Encounter: Payer: Self-pay | Admitting: Urology

## 2020-11-06 ENCOUNTER — Ambulatory Visit (INDEPENDENT_AMBULATORY_CARE_PROVIDER_SITE_OTHER): Payer: Medicare Other | Admitting: Urology

## 2020-11-06 ENCOUNTER — Other Ambulatory Visit: Payer: Self-pay

## 2020-11-06 VITALS — BP 190/99 | HR 62 | Ht 69.0 in | Wt 216.5 lb

## 2020-11-06 DIAGNOSIS — R351 Nocturia: Secondary | ICD-10-CM | POA: Diagnosis not present

## 2020-11-06 DIAGNOSIS — R9341 Abnormal radiologic findings on diagnostic imaging of renal pelvis, ureter, or bladder: Secondary | ICD-10-CM

## 2020-11-06 LAB — URINALYSIS, ROUTINE W REFLEX MICROSCOPIC
Bilirubin, UA: NEGATIVE
Glucose, UA: NEGATIVE
Ketones, UA: NEGATIVE
Leukocytes,UA: NEGATIVE
Nitrite, UA: NEGATIVE
Specific Gravity, UA: 1.015 (ref 1.005–1.030)
Urobilinogen, Ur: 0.2 mg/dL (ref 0.2–1.0)
pH, UA: 5 (ref 5.0–7.5)

## 2020-11-06 LAB — MICROSCOPIC EXAMINATION
Bacteria, UA: NONE SEEN
Renal Epithel, UA: NONE SEEN /hpf
WBC, UA: NONE SEEN /hpf (ref 0–5)

## 2020-11-06 MED ORDER — CIPROFLOXACIN HCL 500 MG PO TABS
500.0000 mg | ORAL_TABLET | Freq: Once | ORAL | Status: AC
  Administered 2020-11-06: 500 mg via ORAL

## 2020-11-06 NOTE — Progress Notes (Signed)
Assessment: 1. Abnormal ultrasound of bladder   2. Nocturia     Plan: No evidence of bladder mass on cystoscopy today.  Findings on U/S may have been related to mucosal fold in bladder due to under distention. Findings discussed with patient. Cipro x 1 following cystoscopy Return to office prn  Chief Complaint:  Chief Complaint  Patient presents with   bladder mass on U/S    History of Present Illness:  Anthony French is a 65 y.o. year old male who is seen in consultation from Ronnie Doss M, DO for evaluation of possible bladder mass seen on ultrasound imaging.  Renal ultrasound from 09/21/2020 showed normal kidneys bilaterally without mass or hydronephrosis and a 1.1 x 0.6 cm nodular density along the right bladder wall.  Review of a study from 3/21 showed this area possibly present at that time.  The patient does not have any history of dysuria or gross hematuria.  No history of UTIs.  He does have frequency and nocturia x3. AUA score = 9 today. No history of tobacco use.   Past Medical History:  Past Medical History:  Diagnosis Date   Diabetes ([place])    Diabetic neuropathy ([place])    Essential hypertension, benign 06/01/2019   Hyperlipidemia     Past Surgical History:  Past Surgical History:  Procedure Laterality Date   APPENDECTOMY  1980   CYST REMOVAL TRUNK      Allergies:  Allergies  Allergen Reactions   Other Hives    Pt states that he is allergic to an ABT but he can not remember what it is    Family History:  Family History  Problem Relation Age of Onset   Stroke Mother    Diabetes Mother    Hypertension Mother    Hyperlipidemia Mother    Kidney disease Mother    COPD Father    Diabetes Sister    Stroke Brother    Diabetes Brother    Arthritis Maternal Grandmother     Social History:  Social History   Tobacco Use   Smoking status: Never   Smokeless tobacco: Never  Vaping Use   Vaping Use: Never used  Substance Use Topics   Alcohol use:  Not Currently   Drug use: Never    Review of symptoms:  Constitutional:  Negative for unexplained weight loss, night sweats, fever, chills ENT:  Negative for nose bleeds, sinus pain, painful swallowing CV:  Negative for chest pain, shortness of breath, exercise intolerance, palpitations, loss of consciousness Resp:  Negative for cough, wheezing, shortness of breath GI:  Negative for nausea, vomiting, diarrhea, bloody stools GU:  Positives noted in HPI; otherwise negative for gross hematuria, dysuria, urinary incontinence Neuro:  Negative for seizures, poor balance, limb weakness, slurred speech Psych:  Negative for lack of energy, depression, anxiety Endocrine:  Negative for polydipsia, polyuria, symptoms of hypoglycemia (dizziness, hunger, sweating) Hematologic:  Negative for anemia, purpura, petechia, prolonged or excessive bleeding, use of anticoagulants  Allergic:  Negative for difficulty breathing or choking as a result of exposure to anything; no shellfish allergy; no allergic response (rash/itch) to materials, foods  Physical exam: BP (!) 190/99   Pulse 62   Ht 5\' 9"  (1.753 m)   Wt 216 lb 8 oz (98.2 kg)   BMI 31.97 kg/m  GENERAL APPEARANCE:  Well appearing, well developed, well nourished, NAD HEENT: Atraumatic, Normocephalic, oropharynx clear. NECK: Supple without lymphadenopathy or thyromegaly. LUNGS: Clear to auscultation bilaterally. HEART: Regular Rate and Rhythm without murmurs,  gallops, or rubs. ABDOMEN: Soft, non-tender, No Masses. EXTREMITIES: Moves all extremities well.  Without clubbing, cyanosis, or edema. NEUROLOGIC:  Alert and oriented x 3, normal gait, CN II-XII grossly intact.  MENTAL STATUS:  Appropriate. BACK:  Non-tender to palpation.  No CVAT SKIN:  Warm, dry and intact.   GU: Penis:  circumcised Meatus: Normal Scrotum: normal, no masses Testis: normal without masses bilateral   Results: Results for orders placed or performed in visit on 11/06/20  (from the past 24 hour(s))  Microscopic Examination   Collection Time: 11/06/20  4:15 PM   Urine  Result Value Ref Range   WBC, UA None seen 0 - 5 /hpf   RBC 0-2 0 - 2 /hpf   Epithelial Cells (non renal) 0-10 0 - 10 /hpf   Renal Epithel, UA None seen None seen /hpf   Mucus, UA Present Not Estab.   Bacteria, UA None seen None seen/Few  Urinalysis, Routine w reflex microscopic   Collection Time: 11/06/20  4:15 PM  Result Value Ref Range   Specific Gravity, UA 1.015 1.005 - 1.030   pH, UA 5.0 5.0 - 7.5   Color, UA Yellow Yellow   Appearance Ur Clear Clear   Leukocytes,UA Negative Negative   Protein,UA 3+ (A) Negative/Trace   Glucose, UA Negative Negative   Ketones, UA Negative Negative   RBC, UA Trace (A) Negative   Bilirubin, UA Negative Negative   Urobilinogen, Ur 0.2 0.2 - 1.0 mg/dL   Nitrite, UA Negative Negative   Microscopic Examination See below:     Procedure:  Flexible Cystourethroscopy  Pre-operative Diagnosis: Bladder lesion  Post-operative Diagnosis:  Normal  Anesthesia:  local with lidocaine jelly  Surgical Narrative:  After appropriate informed consent was obtained, the patient was prepped and draped in the usual sterile fashion in the supine position.  He was correctly identified and the proper procedure delineated prior to proceeding.  Sterile lidocaine gel was instilled in the urethra. The flexible cystoscope was introduced without difficulty.  Findings:  Anterior urethra: Normal  Posterior urethra: Lateral lobe hypertrophy  Bladder: Normal  Ureteral orifices: normal  Additional findings:  Saline bladder wash for cytology was not performed.    The cystoscope was then removed.  The patient tolerated the procedure well.

## 2020-11-06 NOTE — Progress Notes (Signed)

## 2020-11-08 DIAGNOSIS — H35033 Hypertensive retinopathy, bilateral: Secondary | ICD-10-CM | POA: Diagnosis not present

## 2020-11-08 DIAGNOSIS — E113313 Type 2 diabetes mellitus with moderate nonproliferative diabetic retinopathy with macular edema, bilateral: Secondary | ICD-10-CM | POA: Diagnosis not present

## 2020-11-08 DIAGNOSIS — H43823 Vitreomacular adhesion, bilateral: Secondary | ICD-10-CM | POA: Diagnosis not present

## 2020-11-08 DIAGNOSIS — H2513 Age-related nuclear cataract, bilateral: Secondary | ICD-10-CM | POA: Diagnosis not present

## 2020-11-20 ENCOUNTER — Other Ambulatory Visit: Payer: Self-pay | Admitting: Nurse Practitioner

## 2020-11-20 DIAGNOSIS — H43813 Vitreous degeneration, bilateral: Secondary | ICD-10-CM | POA: Diagnosis not present

## 2020-11-20 DIAGNOSIS — Z7984 Long term (current) use of oral hypoglycemic drugs: Secondary | ICD-10-CM | POA: Diagnosis not present

## 2020-11-20 DIAGNOSIS — E113313 Type 2 diabetes mellitus with moderate nonproliferative diabetic retinopathy with macular edema, bilateral: Secondary | ICD-10-CM | POA: Diagnosis not present

## 2020-11-20 DIAGNOSIS — H2513 Age-related nuclear cataract, bilateral: Secondary | ICD-10-CM | POA: Diagnosis not present

## 2020-11-20 LAB — HM DIABETES EYE EXAM

## 2020-11-27 ENCOUNTER — Ambulatory Visit: Payer: Medicare Other | Admitting: Urology

## 2020-12-07 ENCOUNTER — Other Ambulatory Visit: Payer: Medicare Other

## 2020-12-07 ENCOUNTER — Other Ambulatory Visit: Payer: Self-pay

## 2020-12-14 DIAGNOSIS — D696 Thrombocytopenia, unspecified: Secondary | ICD-10-CM | POA: Diagnosis not present

## 2020-12-14 DIAGNOSIS — I129 Hypertensive chronic kidney disease with stage 1 through stage 4 chronic kidney disease, or unspecified chronic kidney disease: Secondary | ICD-10-CM | POA: Diagnosis not present

## 2020-12-14 DIAGNOSIS — E1129 Type 2 diabetes mellitus with other diabetic kidney complication: Secondary | ICD-10-CM | POA: Diagnosis not present

## 2020-12-14 DIAGNOSIS — R809 Proteinuria, unspecified: Secondary | ICD-10-CM | POA: Diagnosis not present

## 2020-12-14 DIAGNOSIS — N170 Acute kidney failure with tubular necrosis: Secondary | ICD-10-CM | POA: Diagnosis not present

## 2020-12-14 DIAGNOSIS — E1122 Type 2 diabetes mellitus with diabetic chronic kidney disease: Secondary | ICD-10-CM | POA: Diagnosis not present

## 2020-12-14 DIAGNOSIS — N189 Chronic kidney disease, unspecified: Secondary | ICD-10-CM | POA: Diagnosis not present

## 2020-12-14 DIAGNOSIS — E875 Hyperkalemia: Secondary | ICD-10-CM | POA: Diagnosis not present

## 2020-12-17 ENCOUNTER — Telehealth: Payer: Self-pay | Admitting: Family Medicine

## 2020-12-17 MED ORDER — FREESTYLE LIBRE 2 SENSOR MISC: 2 refills | Status: DC

## 2020-12-17 NOTE — Telephone Encounter (Signed)
  Prescription Request  12/17/2020  Is this a "Controlled Substance" medicine? no  Have you seen your PCP in the last 2 weeks? no  If YES, route message to pool  -  If NO, patient needs to be scheduled for appointment.  What is the name of the medication or equipment? Continuous Blood Gluc Sensor (FREESTYLE LIBRE 2 SENSOR) MISC  Have you contacted your pharmacy to request a refill? No. Pt has a check up apt on 03/12/2020  Which pharmacy would you like this sent to? [facility] mail order   Patient notified that their request is being sent to the clinical staff for review and that they should receive a response within 2 business days.

## 2020-12-19 ENCOUNTER — Telehealth: Payer: Self-pay | Admitting: Family Medicine

## 2020-12-19 NOTE — Telephone Encounter (Signed)
LMOVM received fax from [facility] for Dr. Lajuana Ripple to sign for his Elenor Legato, when signed will fax back to them

## 2020-12-19 NOTE — Telephone Encounter (Signed)
  Prescription Request  12/19/2020  Is this a "Controlled Substance" medicine? no  Have you seen your PCP in the last 2 weeks? no  If YES, route message to pool  -  If NO, patient needs to be scheduled for appointment.  What is the name of the medication or equipment? Libre  Have you contacted your pharmacy to request a refill? yes   Which pharmacy would you like this sent to? [facility]?   Patient notified that their request is being sent to the clinical staff for review and that they should receive a response within 2 business days.    [facility] pt.  Please call pt.

## 2020-12-21 ENCOUNTER — Ambulatory Visit (INDEPENDENT_AMBULATORY_CARE_PROVIDER_SITE_OTHER): Payer: Medicare Other | Admitting: Podiatry

## 2020-12-21 ENCOUNTER — Other Ambulatory Visit: Payer: Self-pay

## 2020-12-21 DIAGNOSIS — Z794 Long term (current) use of insulin: Secondary | ICD-10-CM

## 2020-12-21 DIAGNOSIS — B351 Tinea unguium: Secondary | ICD-10-CM

## 2020-12-21 DIAGNOSIS — N1832 Chronic kidney disease, stage 3b: Secondary | ICD-10-CM

## 2020-12-21 DIAGNOSIS — M79675 Pain in left toe(s): Secondary | ICD-10-CM

## 2020-12-21 DIAGNOSIS — E1122 Type 2 diabetes mellitus with diabetic chronic kidney disease: Secondary | ICD-10-CM | POA: Diagnosis not present

## 2020-12-21 DIAGNOSIS — M79674 Pain in right toe(s): Secondary | ICD-10-CM

## 2020-12-23 ENCOUNTER — Other Ambulatory Visit: Payer: Self-pay | Admitting: Family Medicine

## 2020-12-24 ENCOUNTER — Ambulatory Visit (INDEPENDENT_AMBULATORY_CARE_PROVIDER_SITE_OTHER): Payer: Medicare Other | Admitting: Nurse Practitioner

## 2020-12-24 ENCOUNTER — Encounter: Payer: Self-pay | Admitting: Nurse Practitioner

## 2020-12-24 ENCOUNTER — Other Ambulatory Visit: Payer: Self-pay

## 2020-12-24 VITALS — BP 202/77 | HR 55 | Ht 69.0 in | Wt 218.8 lb

## 2020-12-24 DIAGNOSIS — E782 Mixed hyperlipidemia: Secondary | ICD-10-CM

## 2020-12-24 DIAGNOSIS — E1122 Type 2 diabetes mellitus with diabetic chronic kidney disease: Secondary | ICD-10-CM

## 2020-12-24 DIAGNOSIS — E039 Hypothyroidism, unspecified: Secondary | ICD-10-CM | POA: Diagnosis not present

## 2020-12-24 DIAGNOSIS — I10 Essential (primary) hypertension: Secondary | ICD-10-CM | POA: Diagnosis not present

## 2020-12-24 DIAGNOSIS — Z794 Long term (current) use of insulin: Secondary | ICD-10-CM | POA: Diagnosis not present

## 2020-12-24 DIAGNOSIS — N1832 Chronic kidney disease, stage 3b: Secondary | ICD-10-CM

## 2020-12-24 DIAGNOSIS — E559 Vitamin D deficiency, unspecified: Secondary | ICD-10-CM

## 2020-12-24 LAB — POCT GLYCOSYLATED HEMOGLOBIN (HGB A1C): HbA1c, POC (controlled diabetic range): 6.5 % (ref 0.0–7.0)

## 2020-12-24 MED ORDER — LEVOTHYROXINE SODIUM 50 MCG PO TABS: 50.0000 ug | ORAL_TABLET | Freq: Every day | ORAL | 1 refills | Status: DC

## 2020-12-24 NOTE — Progress Notes (Signed)
12/24/2020, 11:45 AM  Endocrinology follow-up note   Subjective:    Patient ID: Anthony French, male    DOB: [birth year].  Anthony French is being seen in follow-up after he was seen in consultation for management of currently uncontrolled symptomatic diabetes requested by  Janora Norlander, DO.   Past Medical History:  Diagnosis Date   Diabetes ([place])    Diabetic neuropathy ([place])    Essential hypertension, benign 06/01/2019   Hyperlipidemia     Past Surgical History:  Procedure Laterality Date   APPENDECTOMY  1980   CYST REMOVAL TRUNK      Social History   Socioeconomic History   Marital status: Single    Spouse name: Not on file   Number of children: 3   Years of education: Not on file   Highest education level: Not on file  Occupational History   Occupation: retired  Tobacco Use   Smoking status: Never   Smokeless tobacco: Never  Vaping Use   Vaping Use: Never used  Substance and Sexual Activity   Alcohol use: Not Currently   Drug use: Never   Sexual activity: Not Currently  Other Topics Concern   Not on file  Social History Narrative   Not on file   Social Determinants of Health   Financial Resource Strain: Not on file  Food Insecurity: Not on file  Transportation Needs: Not on file  Physical Activity: Not on file  Stress: Not on file  Social Connections: Not on file    Family History  Problem Relation Age of Onset   Stroke Mother    Diabetes Mother    Hypertension Mother    Hyperlipidemia Mother    Kidney disease Mother    COPD Father    Diabetes Sister    Stroke Brother    Diabetes Brother    Arthritis Maternal Grandmother     Outpatient Encounter Medications as of 12/24/2020  Medication Sig   amLODipine (NORVASC) 5 MG tablet Take 1 tablet (5 mg total) by mouth daily as needed (blood pressure above 150/90.).   atorvastatin (LIPITOR) 40 MG tablet TAKE 1 TABLET BY MOUTH  DAILY   blood glucose meter kit and  supplies Dispense based on patient and insurance preference. Use up to four times daily as directed. (FOR ICD-10 E10.9, E11.9). Check blood sugar twice a day as directed.   Continuous Blood Gluc Receiver (FREESTYLE LIBRE 2 READER) DEVI Use to test blood sugars up to 6 times daily. DX: E11.9   Continuous Blood Gluc Sensor (FREESTYLE LIBRE 2 SENSOR) MISC Use to test blood sugars up to 6 times daily. DX: E11.9   Efinaconazole (JUBLIA) 10 % SOLN Apply 1 application topically daily.   fluconazole (DIFLUCAN) 150 MG tablet Take 1 tablet (150 mg total) by mouth once a week for 26 doses.   gabapentin (NEURONTIN) 300 MG capsule TAKE 1 CAPSULE BY MOUTH IN  THE MORNING AND TAKE 3  CAPSULES BY MOUTH IN THE  EVENING   glucose blood (ACCU-CHEK GUIDE) test strip Test blood sugar 4 times per day, before meals and at bedtime.   insulin NPH-regular Human (70-30) 100 UNIT/ML injection Inject 30 Units into the skin 2 (two) times daily before a meal.   Insulin Pen Needle (PEN NEEDLES) 31G X 8 MM MISC 1 each by Does not apply route in the morning and at bedtime.   Insulin Syringe-Needle U-100 (BD INSULIN SYRINGE U/F) 31G X 5/16" 0.3 ML MISC  USE WITH INSULIN TWICE  DAILY DX E11.22   losartan (COZAAR) 25 MG tablet Take 25 mg by mouth daily.   sodium bicarbonate 650 MG tablet Take 650 mg by mouth 4 (four) times daily.   sodium zirconium cyclosilicate (LOKELMA) 5 g packet Take 5 g by mouth daily.   [DISCONTINUED] glipiZIDE (GLUCOTROL XL) 5 MG 24 hr tablet TAKE 1 TABLET BY MOUTH  DAILY WITH BREAKFAST   [DISCONTINUED] levothyroxine (SYNTHROID) 25 MCG tablet TAKE 1 TABLET BY MOUTH  DAILY BEFORE BREAKFAST   levothyroxine (SYNTHROID) 50 MCG tablet Take 1 tablet (50 mcg total) by mouth daily before breakfast.   [DISCONTINUED] losartan (COZAAR) 50 MG tablet Take 1 tablet (50 mg total) by mouth daily. (Patient not taking: No sig reported)   No facility-administered encounter medications on file as of 12/24/2020.     ALLERGIES: Allergies  Allergen Reactions   Other Hives    Pt states that he is allergic to an ABT but he can not remember what it is   Sulfa Antibiotics Hives    VACCINATION STATUS: Immunization History  Administered Date(s) Administered   Fluad Quad(high Dose 65+) 11/02/2019   Influenza,inj,Quad PF,6+ Mos 12/25/2016   Pneumococcal Conjugate-13 12/27/2018   Pneumococcal Polysaccharide-23 04/16/2017   Tdap 08/19/2017   Zoster Recombinat (Shingrix) 07/28/2019, 09/07/2020    Diabetes He presents for his follow-up diabetic visit. He has type 2 diabetes mellitus. Onset time: He was diagnosed at approximate age of 54 years. His disease course has been stable. There are no hypoglycemic associated symptoms. Pertinent negatives for hypoglycemia include no headaches, nervousness/anxiousness, pallor or tremors. Pertinent negatives for diabetes include no chest pain, no fatigue, no polydipsia, no polyphagia, no polyuria and no weight loss. There are no hypoglycemic complications. Symptoms are stable. Diabetic complications include nephropathy, peripheral neuropathy and retinopathy. Risk factors for coronary artery disease include dyslipidemia, diabetes mellitus, hypertension, male sex, sedentary lifestyle and family history. Current diabetic treatment includes insulin injections and oral agent (monotherapy). He is compliant with treatment most of the time. His weight is fluctuating minimally. He is following a generally healthy diet. When asked about meal planning, he reported none. He has had a previous visit with a dietitian. He never participates in exercise. His home blood glucose trend is fluctuating minimally. His overall blood glucose range is 180-200 mg/dl. (He presents today with his CGM, no logs, showing at goal fasting and postprandial glycemic profile.  His POCT A1c today is 6.5%, increasing slightly from last visit of 6.1%.  He denies any significant hypoglycemia.  He has had trouble  getting his CGM sensors refilled.  Analysis of his CGM shows TIR 51%, TAR 48%, TBR 1%.) An ACE inhibitor/angiotensin II receptor blocker is being taken. He does not see a podiatrist.Eye exam is current.  Hyperlipidemia This is a chronic problem. The current episode started more than 1 year ago. The problem is controlled. Recent lipid tests were reviewed and are normal. Exacerbating diseases include chronic renal disease, diabetes and hypothyroidism. There are no known factors aggravating his hyperlipidemia. Pertinent negatives include no chest pain, myalgias or shortness of breath. Current antihyperlipidemic treatment includes statins. The current treatment provides significant improvement of lipids. There are no compliance problems.  Risk factors for coronary artery disease include diabetes mellitus, dyslipidemia, hypertension, male sex and a sedentary lifestyle.  Hypertension This is a chronic problem. The current episode started more than 1 year ago. The problem has been gradually improving since onset. The problem is controlled. Pertinent negatives include no chest  pain, headaches, palpitations or shortness of breath. There are no associated agents to hypertension. Risk factors for coronary artery disease include dyslipidemia, diabetes mellitus, family history, male gender and sedentary lifestyle. Past treatments include calcium channel blockers and angiotensin blockers. The current treatment provides mild improvement. There are no compliance problems.  Hypertensive end-organ damage includes kidney disease and retinopathy. Identifiable causes of hypertension include chronic renal disease and a thyroid problem.  Thyroid Problem Presents for follow-up (Abnormality noted with recent thyroid studies.  No prior work-up done, no previous labs to compare to.) visit. Onset time: Unknown. Patient reports no anxiety, cold intolerance, constipation, diarrhea, fatigue, heat intolerance, palpitations, tremors, weight  gain or weight loss. The symptoms have been stable. Past treatments include nothing. His past medical history is significant for diabetes and hyperlipidemia. There are no known risk factors. per  Review of systems  Constitutional: + Minimally fluctuating body weight,  current Body mass index is 32.31 kg/m. , no fatigue, no subjective hyperthermia, no subjective hypothermia Eyes: no blurry vision, no xerophthalmia ENT: no sore throat, no nodules palpated in throat, no dysphagia/odynophagia, no hoarseness Cardiovascular: no chest pain, no shortness of breath, no palpitations, no leg swelling Respiratory: no cough, no shortness of breath Gastrointestinal: no nausea/vomiting/diarrhea Musculoskeletal: no muscle/joint aches Skin: no rashes, no hyperemia Neurological: no tremors, no numbness, no tingling, no dizziness Psychiatric: no depression, no anxiety   Objective:    BP (!) 202/77   Pulse (!) 55   Ht 5' 9"  (1.753 m)   Wt 218 lb 12.8 oz (99.2 kg)   BMI 32.31 kg/m   Wt Readings from Last 3 Encounters:  12/24/20 218 lb 12.8 oz (99.2 kg)  11/06/20 216 lb 8 oz (98.2 kg)  09/07/20 211 lb 6.4 oz (95.9 kg)     BP Readings from Last 3 Encounters:  12/24/20 (!) 202/77  11/06/20 (!) 190/99  09/07/20 133/85     Physical Exam- Limited  Constitutional:  Body mass index is 32.31 kg/m. , not in acute distress, normal state of mind Eyes:  EOMI, no exophthalmos Neck: Supple Cardiovascular: RRR, no murmurs, rubs, or gallops, no edema Respiratory: Adequate breathing efforts, no crackles, rales, rhonchi, or wheezing Musculoskeletal: no gross deformities, strength intact in all four extremities, no gross restriction of joint movements Skin:  no rashes, no hyperemia Neurological: no tremor with outstretched hands    CMP ( most recent) CMP     Component Value Date/Time   NA 139 09/19/2020 1412   NA 145 (H) 09/07/2020 1410   K 4.9 09/19/2020 1412   CL 107 09/19/2020 1412   CO2 24  09/19/2020 1412   GLUCOSE 232 (H) 09/19/2020 1412   BUN 37 (H) 09/19/2020 1412   BUN 57 (H) 09/07/2020 1410   CREATININE 2.15 (H) 09/19/2020 1412   CALCIUM 8.7 (L) 09/19/2020 1412   PROT 6.3 08/15/2020 0817   ALBUMIN 3.9 09/19/2020 1412   ALBUMIN 4.4 08/15/2020 0817   AST 14 08/15/2020 0817   ALT 17 08/15/2020 0817   ALKPHOS 71 08/15/2020 0817   BILITOT 0.5 08/15/2020 0817   GFRNONAA 33 (L) 09/19/2020 1412   GFRAA 47 (L) 09/13/2019 1420    Diabetic Labs (most recent): Lab Results  Component Value Date   HGBA1C 6.5 12/24/2020   HGBA1C 6.1 08/20/2020   HGBA1C 7.1 (A) 04/20/2020     Lipid Panel ( most recent) Lipid Panel     Component Value Date/Time   CHOL 146 08/15/2020 0817   TRIG 130 08/15/2020  0817   HDL 36 (L) 08/15/2020 0817   CHOLHDL 4.1 08/15/2020 0817   LDLCALC 87 08/15/2020 0817   LABVLDL 23 08/15/2020 0817     Assessment & Plan:   1) Type 2 diabetes mellitus with stage 3b chronic kidney disease, with long-term current use of insulin ([facility])  - Dejuan Elman has currently uncontrolled symptomatic type 2 DM since  65 years of age.   Recent labs reviewed.  He presents today with his CGM, no logs, showing at goal fasting and postprandial glycemic profile.  His POCT A1c today is 6.5%, increasing slightly from last visit of 6.1%.  He denies any significant hypoglycemia.  He has had trouble getting his CGM sensors refilled.  Analysis of his CGM shows TIR 51%, TAR 48%, TBR 1%.  - I had a long discussion with him about the progressive nature of diabetes and the pathology behind its complications. -his diabetes is complicated by CKD, peripheral neuropathy and he remains at a high risk for more acute and chronic complications which include CAD, CVA, CKD, retinopathy, and neuropathy. These are all discussed in detail with him.  - Nutritional counseling repeated at each appointment due to patients tendency to fall back in to old habits.  - The patient admits there is a  room for improvement in their diet and drink choices. -  Suggestion is made for the patient to avoid simple carbohydrates from their diet including Cakes, Sweet Desserts / Pastries, Ice Cream, Soda (diet and regular), Sweet Tea, Candies, Chips, Cookies, Sweet Pastries, Store Bought Juices, Alcohol in Excess of 1-2 drinks a day, Artificial Sweeteners, Coffee Creamer, and "Sugar-free" Products. This will help patient to have stable blood glucose profile and potentially avoid unintended weight gain.   - I encouraged the patient to switch to unprocessed or minimally processed complex starch and increased protein intake (animal or plant source), fruits, and vegetables.   - Patient is advised to stick to a routine mealtimes to eat 3 meals a day and avoid unnecessary snacks (to snack only to correct hypoglycemia).  - I have approached him with the following individualized plan to manage  his diabetes and patient agrees:   - he will continue to benefit from simplicity of premixed insulin.   -Based on his stable glycemic profile overall, he is advised to continue current medication regimen of Novolin 70/30 30 units with breakfast and supper if glucose is above 90 and he is eating.  He can also continue Glipizide 5 mg XL daily with breakfast.    -He is encouraged to continue using his CGM to monitor blood glucose 4 times per day, before meals and before bed, and call the clinic if he has readings less than 70 or greater than 300 for 3 tests in a row.  - Specific targets for  A1c;  LDL, HDL,  and Triglycerides were discussed with the patient.  2) Blood Pressure /Hypertension:  His blood pressure is not controlled to target, he had not long taken his medication prior to the visit today.  He is advised to continue Cozaar 25 mg po daily as well as Amlodipine 5 mg po daily as needed.  3) Lipids/Hyperlipidemia:  His most recent lipid panel from 08/15/20 shows controlled LDL of 87.  He is advised to continue  Atorvastatin 40 mg po daily at bedtime.  Side effects and precautions discussed with him.    4)  Weight/Diet:  His Body mass index is 32.31 kg/m.     he is a  candidate for some weight loss. I discussed with him the fact that loss of 5 - 10% of his  current body weight will have the most impact on his diabetes management.  Exercise, and detailed carbohydrates information provided  -  detailed on discharge instructions.  5)Hypothyroidism-acquired: His previsit thyroid function tests are consistent with slight under-replacement.  He is advised to increase his dose of Levothyroxine to 50 mcg po daily before breakfast.  Will recheck TFTs prior to next visit and adjust dose further if needed.  - The correct intake of thyroid hormone (Levothyroxine, Synthroid), is on empty stomach first thing in the morning, with water, separated by at least 30 minutes from breakfast and other medications,  and separated by more than 4 hours from calcium, iron, multivitamins, acid reflux medications (PPIs).  - This medication is a life-long medication and will be needed to correct thyroid hormone imbalances for the rest of your life.  The dose may change from time to time, based on thyroid blood work.  - It is extremely important to be consistent taking this medication, near the same time each morning.  -AVOID TAKING PRODUCTS CONTAINING BIOTIN (commonly found in Hair, Skin, Nails vitamins) AS IT INTERFERES WITH THE VALIDITY OF THYROID FUNCTION BLOOD TESTS.  6) Chronic Care/Health Maintenance: -he is on ARB and Statin medications and  is encouraged to initiate and continue to follow up with Ophthalmology, Dentist,  Podiatrist at least yearly or according to recommendations, and advised to stay away from smoking. I have recommended yearly flu vaccine and pneumonia vaccine at least every 5 years; moderate intensity exercise for up to 150 minutes weekly; and  sleep for at least 7 hours a day.  - he is advised to maintain  close follow up with Janora Norlander, DO for primary care needs, as well as his other providers for optimal and coordinated care.     I spent 40 minutes in the care of the patient today including review of labs from [facility], Lipids, Thyroid Function, Hematology (current and previous including abstractions from other facilities); face-to-face time discussing  his blood glucose readings/logs, discussing hypoglycemia and hyperglycemia episodes and symptoms, medications doses, his options of short and long term treatment based on the latest standards of care / guidelines;  discussion about incorporating lifestyle medicine;  and documenting the encounter.    Please refer to Patient Instructions for Blood Glucose Monitoring and Insulin/Medications Dosing Guide"  in media tab for additional information. Please  also refer to " Patient Self Inventory" in the Media  tab for reviewed elements of pertinent patient history.  [name] participated in the discussions, expressed understanding, and voiced agreement with the above plans.  All questions were answered to his satisfaction. he is encouraged to contact clinic should he have any questions or concerns prior to his return visit.   Follow up plan: - Return in about 4 months (around 04/23/2021) for Diabetes F/U with A1c in office, Thyroid follow up, Previsit labs, Bring meter and logs.  Rayetta Pigg, [facility] [address] [place], [place] [address] Phone: [phone] Fax: [phone]   12/24/2020, 11:45 AM

## 2020-12-24 NOTE — Patient Instructions (Signed)

## 2020-12-25 ENCOUNTER — Encounter: Payer: Self-pay | Admitting: Podiatry

## 2020-12-25 NOTE — Progress Notes (Signed)
  Subjective:  Patient ID: Anthony French, male    DOB: [birth year],  MRN: [id number]  Anthony French presents to clinic today for at risk foot care with history of diabetic neuropathy and painful elongated mycotic toenails 1-5 bilaterally which are tender when wearing enclosed shoe gear. Pain is relieved with periodic professional debridement.  Patient states blood glucose was 164 mg/dl today.    PCP is Anthony Norlander, DO , and last visit was 09/07/2020.  Allergies  Allergen Reactions   Other Hives    Pt states that he is allergic to an ABT but he can not remember what it is   Sulfa Antibiotics Hives    Review of Systems: Negative except as noted in the HPI. Objective:   Constitutional Anthony French is a pleasant 65 y.o. Caucasian male, WD, WN in NAD. AAO x 3.   Vascular CFT immediate b/l LE. Palpable DP/PT pulses b/l LE. Digital hair present b/l. Skin temperature gradient WNL b/l. No pain with calf compression b/l. No edema noted b/l. No cyanosis or clubbing noted b/l LE. No cyanosis or clubbing noted.  Neurologic Normal speech. Oriented to person, place, and time. Protective sensation intact 5/5 intact bilaterally with 10g monofilament b/l. Vibratory sensation intact b/l.  Dermatologic Toenails L hallux elongated, discolored, dystrophic, thickened, and crumbly with subungual debris and tenderness to dorsal palpation.  Orthopedic: Normal muscle strength 5/5 to all lower extremity muscle groups bilaterally. No pain, crepitus or joint limitation noted with ROM b/l LE. No gross bony pedal deformities b/l. Patient ambulates independently without assistive aids.   Radiographs: None Assessment:   1. Pain due to onychomycosis of toenails of both feet   2. Type 2 diabetes mellitus with stage 3b chronic kidney disease, with long-term current use of insulin ([place])    Plan:  Patient was evaluated and treated and all questions answered. Consent given for treatment as described below: -No new  findings. No new orders. -Patient instructed to continue use of Jublia (Efinzonazole) 10% Solution to affected toenails once daily. -Toenails L hallux debrided in length and girth without iatrogenic bleeding with sterile nail nipper and dremel.  -Nondystrophic toenails trimmed 2-5 bilaterally and R hallux. -Patient/POA to call should there be question/concern in the interim.  Return in about 3 months (around 03/23/2021).  Marzetta Board, DPM

## 2020-12-27 ENCOUNTER — Ambulatory Visit (INDEPENDENT_AMBULATORY_CARE_PROVIDER_SITE_OTHER): Payer: Medicare Other | Admitting: Family Medicine

## 2020-12-27 ENCOUNTER — Other Ambulatory Visit: Payer: Self-pay | Admitting: Family Medicine

## 2020-12-27 ENCOUNTER — Encounter: Payer: Self-pay | Admitting: Family Medicine

## 2020-12-27 ENCOUNTER — Other Ambulatory Visit: Payer: Self-pay

## 2020-12-27 VITALS — BP 179/89 | HR 60 | Temp 98.1°F | Resp 20 | Ht 69.0 in | Wt 219.0 lb

## 2020-12-27 DIAGNOSIS — I10 Essential (primary) hypertension: Secondary | ICD-10-CM

## 2020-12-27 DIAGNOSIS — Z23 Encounter for immunization: Secondary | ICD-10-CM | POA: Diagnosis not present

## 2020-12-27 MED ORDER — AMLODIPINE BESYLATE 10 MG PO TABS: 10.0000 mg | ORAL_TABLET | Freq: Every day | ORAL | 1 refills | Status: DC

## 2020-12-27 NOTE — Patient Instructions (Signed)

## 2020-12-27 NOTE — Progress Notes (Signed)
Acute Office Visit  Subjective:    Patient ID: Anthony French, male    DOB: December 06, 1955, 66 y.o.   MRN: [id number]  Chief Complaint  Patient presents with   Hypertension    Elevated recently     HPI Patient is in today for elevated BP. He is here with his wife. He reports that this BPs have been elevated for the last few weeks. They were running 150s/90s and he was restarted on losartan last week for this. On Monday his BP was 202/77 at his endocrinology visit. He has been monitoring it at home since then and it has been 170s/80s. He has been taking amlodipine 5 mg daily this week as well. He denies chest pain, shortness of breath, edema, dizziness, fatigue, palpitations, focal weakness, nausea, visual disturbances, or confusion.   Past Medical History:  Diagnosis Date   Diabetes ([place])    Diabetic neuropathy ([place])    Essential hypertension, benign 06/01/2019   Hyperlipidemia     Past Surgical History:  Procedure Laterality Date   APPENDECTOMY  1980   CYST REMOVAL TRUNK      Family History  Problem Relation Age of Onset   Stroke Mother    Diabetes Mother    Hypertension Mother    Hyperlipidemia Mother    Kidney disease Mother    COPD Father    Diabetes Sister    Stroke Brother    Diabetes Brother    Arthritis Maternal Grandmother     Social History   Socioeconomic History   Marital status: Single    Spouse name: Not on file   Number of children: 3   Years of education: Not on file   Highest education level: Not on file  Occupational History   Occupation: retired  Tobacco Use   Smoking status: Never   Smokeless tobacco: Never  Vaping Use   Vaping Use: Never used  Substance and Sexual Activity   Alcohol use: Not Currently   Drug use: Never   Sexual activity: Not Currently  Other Topics Concern   Not on file  Social History Narrative   Not on file   Social Determinants of Health   Financial Resource Strain: Not on file  Food Insecurity: Not on file   Transportation Needs: Not on file  Physical Activity: Not on file  Stress: Not on file  Social Connections: Not on file  Intimate Partner Violence: Not on file    Outpatient Medications Prior to Visit  Medication Sig Dispense Refill   amLODipine (NORVASC) 5 MG tablet TAKE 1 TABLET BY MOUTH  DAILY AS NEEDED FOR BLOOD  PRESSURE ABOVE 150/90 90 tablet 0   atorvastatin (LIPITOR) 40 MG tablet TAKE 1 TABLET BY MOUTH  DAILY 90 tablet 0   blood glucose meter kit and supplies Dispense based on patient and insurance preference. Use up to four times daily as directed. (FOR ICD-10 E10.9, E11.9). Check blood sugar twice a day as directed. 1 each 0   Continuous Blood Gluc Receiver (FREESTYLE LIBRE 2 READER) DEVI Use to test blood sugars up to 6 times daily. DX: E11.9 1 each 0   Continuous Blood Gluc Sensor (FREESTYLE LIBRE 2 SENSOR) MISC Use to test blood sugars up to 6 times daily. DX: E11.9 6 each 2   Efinaconazole (JUBLIA) 10 % SOLN Apply 1 application topically daily. 4 mL 5   gabapentin (NEURONTIN) 300 MG capsule TAKE 1 CAPSULE BY MOUTH IN  THE MORNING AND TAKE 3  CAPSULES BY MOUTH  IN THE  EVENING 360 capsule 0   glipiZIDE (GLUCOTROL XL) 5 MG 24 hr tablet TAKE 1 TABLET BY MOUTH  DAILY WITH BREAKFAST 90 tablet 0   glucose blood (ACCU-CHEK GUIDE) test strip Test blood sugar 4 times per day, before meals and at bedtime. 500 strip 3   insulin NPH-regular Human (70-30) 100 UNIT/ML injection Inject 30 Units into the skin 2 (two) times daily before a meal.     Insulin Pen Needle (PEN NEEDLES) 31G X 8 MM MISC 1 each by Does not apply route in the morning and at bedtime. 100 each 11   Insulin Syringe-Needle U-100 (BD INSULIN SYRINGE U/F) 31G X 5/16" 0.3 ML MISC USE WITH INSULIN TWICE  DAILY DX E11.22 200 each 3   levothyroxine (SYNTHROID) 50 MCG tablet Take 1 tablet (50 mcg total) by mouth daily before breakfast. 90 tablet 1   losartan (COZAAR) 25 MG tablet Take 25 mg by mouth daily.     sodium bicarbonate  650 MG tablet Take 650 mg by mouth 4 (four) times daily.     sodium zirconium cyclosilicate (LOKELMA) 5 g packet Take 5 g by mouth daily.     fluconazole (DIFLUCAN) 150 MG tablet Take 1 tablet (150 mg total) by mouth once a week for 26 doses. (Patient not taking: Reported on 12/27/2020) 26 tablet 0   No facility-administered medications prior to visit.    Allergies  Allergen Reactions   Other Hives    Pt states that he is allergic to an ABT but he can not remember what it is   Sulfa Antibiotics Hives    Review of Systems As per HPI.    Objective:    Physical Exam Vitals and nursing note reviewed.  Constitutional:      General: He is not in acute distress.    Appearance: He is not ill-appearing, toxic-appearing or diaphoretic.  HENT:     Head: Normocephalic and atraumatic.  Eyes:     Extraocular Movements: Extraocular movements intact.     Conjunctiva/sclera: Conjunctivae normal.     Pupils: Pupils are equal, round, and reactive to light.  Cardiovascular:     Rate and Rhythm: Normal rate and regular rhythm.     Heart sounds: Normal heart sounds. No murmur heard. Pulmonary:     Effort: No respiratory distress.     Breath sounds: Normal breath sounds. No wheezing, rhonchi or rales.  Abdominal:     General: Bowel sounds are normal. There is no distension.     Palpations: Abdomen is soft.     Tenderness: There is no abdominal tenderness. There is no guarding or rebound.  Musculoskeletal:     Right lower leg: No edema.     Left lower leg: No edema.  Skin:    General: Skin is warm and dry.  Neurological:     General: No focal deficit present.     Mental Status: He is alert and oriented to person, place, and time.     Cranial Nerves: No cranial nerve deficit.     Motor: No weakness.     Gait: Gait normal.  Psychiatric:        Mood and Affect: Mood normal.        Behavior: Behavior normal.    BP (!) 179/89 Comment: home reading  Pulse 60   Temp 98.1 F (36.7 C)    Resp 20   Ht 5' 9"  (1.753 m)   Wt 219 lb (99.3 kg)   SpO2  98%   BMI 32.34 kg/m  Wt Readings from Last 3 Encounters:  12/27/20 219 lb (99.3 kg)  12/24/20 218 lb 12.8 oz (99.2 kg)  11/06/20 216 lb 8 oz (98.2 kg)    Health Maintenance Due  Topic Date Due   COVID-19 Vaccine (1) Never done   INFLUENZA VACCINE  09/10/2020   OPHTHALMOLOGY EXAM  11/21/2020    There are no preventive care reminders to display for this patient.   Lab Results  Component Value Date   TSH 2.880 10/26/2020   Lab Results  Component Value Date   WBC 4.9 08/23/2019   HGB 14.1 08/23/2019   HCT 41.3 08/23/2019   MCV 87 08/23/2019   PLT 150 08/23/2019   Lab Results  Component Value Date   NA 139 09/19/2020   K 4.9 09/19/2020   CO2 24 09/19/2020   GLUCOSE 232 (H) 09/19/2020   BUN 37 (H) 09/19/2020   CREATININE 2.15 (H) 09/19/2020   BILITOT 0.5 08/15/2020   ALKPHOS 71 08/15/2020   AST 14 08/15/2020   ALT 17 08/15/2020   PROT 6.3 08/15/2020   ALBUMIN 3.9 09/19/2020   CALCIUM 8.7 (L) 09/19/2020   ANIONGAP 8 09/19/2020   EGFR 25 (L) 09/07/2020   Lab Results  Component Value Date   CHOL 146 08/15/2020   Lab Results  Component Value Date   HDL 36 (L) 08/15/2020   Lab Results  Component Value Date   LDLCALC 87 08/15/2020   Lab Results  Component Value Date   TRIG 130 08/15/2020   Lab Results  Component Value Date   CHOLHDL 4.1 08/15/2020   Lab Results  Component Value Date   HGBA1C 6.5 12/24/2020       Assessment & Plan:   Gianmarco was seen today for hypertension.  Diagnoses and all orders for this visit:  Uncontrolled hypertension Asymptomatic, benign exam today. Increase amlodipine to 10 mg daily. BP log given. Follow up in 1 week. Strict return precautions given.  -     amLODipine (NORVASC) 10 MG tablet; Take 1 tablet (10 mg total) by mouth daily.  Need for immunization against influenza Flu vaccine today in office.   The above assessment and management plan was  discussed with the patient. The patient verbalized understanding of and has agreed to the management plan.  Patient is aware when to return to the clinic for a follow-up visit. Patient educated on when it is appropriate to go to the emergency department.   Gwenlyn Perking, FNP

## 2021-01-02 ENCOUNTER — Encounter: Payer: Self-pay | Admitting: Family Medicine

## 2021-01-02 ENCOUNTER — Other Ambulatory Visit: Payer: Self-pay

## 2021-01-02 ENCOUNTER — Ambulatory Visit (INDEPENDENT_AMBULATORY_CARE_PROVIDER_SITE_OTHER): Payer: Medicare Other | Admitting: Family Medicine

## 2021-01-02 VITALS — BP 149/74 | HR 60 | Temp 98.5°F | Ht 69.0 in | Wt 219.0 lb

## 2021-01-02 DIAGNOSIS — I10 Essential (primary) hypertension: Secondary | ICD-10-CM | POA: Diagnosis not present

## 2021-01-02 NOTE — Patient Instructions (Signed)
Cooking With Less Salt Cooking with less salt is one way to reduce the amount of sodium you get from food. Sodium is one of the elements that make up salt. It is found naturally in foods and is also added to certain foods. Depending on your condition and overall health, your health care provider or dietitian may recommend that you reduce your sodium intake. Most people should have less than 2,300 milligrams (mg) of sodium each day. If you have high blood pressure (hypertension), you may need to limit your sodium to 1,500 mg each day. Follow the tipsbelow to help reduce your sodium intake. What are tips for eating less sodium? Reading food labels  Check the food label before buying or using packaged ingredients. Always check the label for the serving size and sodium content. Look for products with no more than 140 mg of sodium in one serving. Check the % Daily Value column to see what percent of the daily recommended amount of sodium is provided in one serving of the product. Foods with 5% or less in this column are considered low in sodium. Foods with 20% or higher are considered high in sodium. Do not choose foods with salt as one of the first three ingredients on the ingredients list. If salt is one of the first three ingredients, it usually means the item is high in sodium.  Shopping Buy sodium-free or low-sodium products. Look for the following words on food labels: Low-sodium. Sodium-free. Reduced-sodium. No salt added. Unsalted. Always check the sodium content even if foods are labeled as low-sodium or no salt added. Buy fresh foods. Cooking Use herbs, seasonings without salt, and spices as substitutes for salt. Use sodium-free baking soda when baking. Grill, braise, or roast foods to add flavor with less salt. Avoid adding salt to pasta, rice, or hot cereals. Drain and rinse canned vegetables, beans, and meat before use. Avoid adding salt when cooking sweets and desserts. Cook with  low-sodium ingredients. What foods are high in sodium? Vegetables Regular canned vegetables (not low-sodium or reduced-sodium). Sauerkraut, pickled vegetables, and relishes. Olives. French fries. Onion rings. Regular canned tomato sauce and paste. Regular tomato and vegetable juice. Frozenvegetables in sauces. Grains Instant hot cereals. Bread stuffing, pancake, and biscuit mixes. Croutons. Seasoned rice or pasta mixes. Noodle soup cups. Boxed or frozen macaroni and cheese. Regular salted crackers. Self-rising flour. Rolls. Bagels. Flourtortillas and wraps. Meats and other proteins Meat or fish that is salted, canned, smoked, cured, spiced, or pickled. This includes bacon, ham, sausages, hot dogs, corned beef, chipped beef, meat loaves, salt pork, jerky, pickled herring, anchovies, regular canned tuna, andsardines. Salted nuts. Dairy Processed cheese and cheese spreads. Cheese curds. Blue cheese. Feta cheese.String cheese. Regular cottage cheese. Buttermilk. Canned milk. The items listed above may not be a complete list of foods high in sodium. Actual amounts of sodium may be different depending on processing. Contact a dietitian for more information. What foods are low in sodium? Fruits Fresh, frozen, or canned fruit with no sauce added. Fruit juice. Vegetables Fresh or frozen vegetables with no sauce added. "No salt added" canned vegetables. "No salt added" tomato sauce and paste. Low-sodium orreduced-sodium tomato and vegetable juice. Grains Noodles, pasta, quinoa, rice. Shredded or puffed wheat or puffed rice. Regular or quick oats (not instant). Low-sodium crackers. Low-sodium bread. Whole-grainbread and whole-grain pasta. Unsalted popcorn. Meats and other proteins Fresh or frozen whole meats, poultry (not injected with sodium), and fish with no sauce added. Unsalted nuts. Dried peas, beans, and   lentils without added salt. Unsalted canned beans. Eggs. Unsalted nut butters. Low-sodium canned  tunaor chicken. Dairy Milk. Soy milk. Yogurt. Low-sodium cheeses, such as Swiss, Monterey Jack, mozzarella, and ricotta. Sherbet or ice cream (keep to  cup per serving).Cream cheese. Fats and oils Unsalted butter or margarine. Other foods Homemade pudding. Sodium-free baking soda and baking powder. Herbs and spices.Low-sodium seasoning mixes. Beverages Coffee and tea. Carbonated beverages. The items listed above may not be a complete list of foods low in sodium. Actual amounts of sodium may be different depending on processing. Contact a dietitian for more information. What are some salt alternatives when cooking? The following are herbs, seasonings, and spices that can be used instead of salt to flavor your food. Herbs should be fresh or dried. Do not choose packaged mixes. Next to the name of the herb, spice, or seasoning aresome examples of foods you can pair it with. Herbs [place] leaves - Soups, meat and vegetable dishes, and spaghetti sauce. Basil - Italian dishes, soups, pasta, and fish dishes. Cilantro - Meat, poultry, and vegetable dishes. Chili powder - Marinades and Mexican dishes. Chives - Salad dressings and potato dishes. Cumin - Mexican dishes, couscous, and meat dishes. Dill - Fish dishes, sauces, and salads. Fennel - Meat and vegetable dishes, breads, and cookies. Garlic (do not use garlic salt) - Italian dishes, meat dishes, salad dressings, and sauces. Marjoram - Soups, potato dishes, and meat dishes. Oregano - Pizza and spaghetti sauce. Parsley - Salads, soups, pasta, and meat dishes. Rosemary - Italian dishes, salad dressings, soups, and red meats. Saffron - Fish dishes, pasta, and some poultry dishes. Sage - Stuffings and sauces. Tarragon - Fish and poultry dishes. Thyme - Stuffing, meat, and fish dishes. Seasonings Lemon juice - Fish dishes, poultry dishes, vegetables, and salads. Vinegar - Salad dressings, vegetables, and fish dishes. Spices Cinnamon - Sweet  dishes, such as cakes, cookies, and puddings. Cloves - Gingerbread, puddings, and marinades for meats. Curry - Vegetable dishes, fish and poultry dishes, and stir-fry dishes. Ginger - Vegetable dishes, fish dishes, and stir-fry dishes. Nutmeg - Pasta, vegetables, poultry, fish dishes, and custard. Summary Cooking with less salt is one way to reduce the amount of sodium that you get from food. Buy sodium-free or low-sodium products. Check the food label before using or buying packaged ingredients. Use herbs, seasonings without salt, and spices as substitutes for salt in foods. This information is not intended to replace advice given to you by your health care provider. Make sure you discuss any questions you have with your healthcare provider. Document Revised: 01/19/2019 Document Reviewed: 01/19/2019 Elsevier Patient Education  2022 Elsevier Inc.  

## 2021-01-02 NOTE — Progress Notes (Signed)
Established Patient Office Visit  Subjective:  Patient ID: Anthony French, male    DOB: 09/08/1955  Age: 65 y.o. MRN: [id number]  CC:  Chief Complaint  Patient presents with   Hypertension    HPI Anthony French presents for follow up of HTN. At his visit last his BP was 179/89. Two days prior, it was 882C systolic. His amlodipine dosage was increased form 5 mg to 10 daily at his visit last week. He has been restarted on losartan 25 mg the week prior. This had previously been discontinued for hyperkalemia. He has been keeping a BP log and has this with him today. His BPs have been trending down and has been 150s/70s most recently. He denies chest pain, edema, orthopnea, dyspnea, dizziness, palpitations, weakness, or visual disturbances. He does eat a lot of frozen dinner meals. He does not add salt to food.   Past Medical History:  Diagnosis Date   Diabetes ([place])    Diabetic neuropathy ([place])    Essential hypertension, benign 06/01/2019   Hyperlipidemia     Past Surgical History:  Procedure Laterality Date   APPENDECTOMY  1980   CYST REMOVAL TRUNK      Family History  Problem Relation Age of Onset   Stroke Mother    Diabetes Mother    Hypertension Mother    Hyperlipidemia Mother    Kidney disease Mother    COPD Father    Diabetes Sister    Stroke Brother    Diabetes Brother    Arthritis Maternal Grandmother     Social History   Socioeconomic History   Marital status: Single    Spouse name: Not on file   Number of children: 3   Years of education: Not on file   Highest education level: Not on file  Occupational History   Occupation: retired  Tobacco Use   Smoking status: Never   Smokeless tobacco: Never  Vaping Use   Vaping Use: Never used  Substance and Sexual Activity   Alcohol use: Not Currently   Drug use: Never   Sexual activity: Not Currently  Other Topics Concern   Not on file  Social History Narrative   Not on file   Social Determinants of Health    Financial Resource Strain: Not on file  Food Insecurity: Not on file  Transportation Needs: Not on file  Physical Activity: Not on file  Stress: Not on file  Social Connections: Not on file  Intimate Partner Violence: Not on file    Outpatient Medications Prior to Visit  Medication Sig Dispense Refill   amLODipine (NORVASC) 10 MG tablet Take 1 tablet (10 mg total) by mouth daily. 90 tablet 1   atorvastatin (LIPITOR) 40 MG tablet TAKE 1 TABLET BY MOUTH  DAILY 90 tablet 0   blood glucose meter kit and supplies Dispense based on patient and insurance preference. Use up to four times daily as directed. (FOR ICD-10 E10.9, E11.9). Check blood sugar twice a day as directed. 1 each 0   Continuous Blood Gluc Receiver (FREESTYLE LIBRE 2 READER) DEVI Use to test blood sugars up to 6 times daily. DX: E11.9 1 each 0   Continuous Blood Gluc Sensor (FREESTYLE LIBRE 2 SENSOR) MISC Use to test blood sugars up to 6 times daily. DX: E11.9 6 each 2   Efinaconazole (JUBLIA) 10 % SOLN Apply 1 application topically daily. 4 mL 5   fluconazole (DIFLUCAN) 150 MG tablet Take 1 tablet (150 mg total) by mouth once a  week for 26 doses. 26 tablet 0   gabapentin (NEURONTIN) 300 MG capsule TAKE 1 CAPSULE BY MOUTH IN  THE MORNING AND TAKE 3  CAPSULES BY MOUTH IN THE  EVENING 360 capsule 0   glipiZIDE (GLUCOTROL XL) 5 MG 24 hr tablet TAKE 1 TABLET BY MOUTH  DAILY WITH BREAKFAST 90 tablet 0   glucose blood (ACCU-CHEK GUIDE) test strip Test blood sugar 4 times per day, before meals and at bedtime. 500 strip 3   insulin NPH-regular Human (70-30) 100 UNIT/ML injection Inject 30 Units into the skin 2 (two) times daily before a meal.     Insulin Pen Needle (PEN NEEDLES) 31G X 8 MM MISC 1 each by Does not apply route in the morning and at bedtime. 100 each 11   Insulin Syringe-Needle U-100 (BD INSULIN SYRINGE U/F) 31G X 5/16" 0.3 ML MISC USE WITH INSULIN TWICE  DAILY DX E11.22 200 each 3   levothyroxine (SYNTHROID) 50 MCG  tablet Take 1 tablet (50 mcg total) by mouth daily before breakfast. 90 tablet 1   losartan (COZAAR) 25 MG tablet Take 25 mg by mouth daily.     sodium bicarbonate 650 MG tablet Take 650 mg by mouth 4 (four) times daily.     sodium zirconium cyclosilicate (LOKELMA) 5 g packet Take 5 g by mouth daily.     No facility-administered medications prior to visit.    Allergies  Allergen Reactions   Other Hives    Pt states that he is allergic to an ABT but he can not remember what it is   Sulfa Antibiotics Hives    ROS Review of Systems As per HPI.   Objective:    Physical Exam Vitals and nursing note reviewed.  Constitutional:      Appearance: Normal appearance.  Neck:     Vascular: No carotid bruit.  Cardiovascular:     Rate and Rhythm: Normal rate and regular rhythm.     Heart sounds: Normal heart sounds. No murmur heard. Pulmonary:     Effort: Pulmonary effort is normal. No respiratory distress.     Breath sounds: Normal breath sounds.  Abdominal:     General: Bowel sounds are normal.     Palpations: Abdomen is soft.  Musculoskeletal:     Cervical back: Neck supple. No tenderness.     Right lower leg: No edema.     Left lower leg: No edema.  Skin:    General: Skin is warm and dry.  Neurological:     General: No focal deficit present.     Mental Status: He is alert and oriented to person, place, and time.  Psychiatric:        Mood and Affect: Mood normal.        Behavior: Behavior normal.    BP (!) 149/74   Pulse 60   Temp 98.5 F (36.9 C) (Temporal)   Ht $R'5\' 9"'WI$  (1.753 m)   Wt 219 lb (99.3 kg)   BMI 32.34 kg/m  Wt Readings from Last 3 Encounters:  01/02/21 219 lb (99.3 kg)  12/27/20 219 lb (99.3 kg)  12/24/20 218 lb 12.8 oz (99.2 kg)     Health Maintenance Due  Topic Date Due   COVID-19 Vaccine (1) Never done   OPHTHALMOLOGY EXAM  11/21/2020    There are no preventive care reminders to display for this patient.  Lab Results  Component Value Date    TSH 2.880 10/26/2020   Lab Results  Component Value  Date   WBC 4.9 08/23/2019   HGB 14.1 08/23/2019   HCT 41.3 08/23/2019   MCV 87 08/23/2019   PLT 150 08/23/2019   Lab Results  Component Value Date   NA 139 09/19/2020   K 4.9 09/19/2020   CO2 24 09/19/2020   GLUCOSE 232 (H) 09/19/2020   BUN 37 (H) 09/19/2020   CREATININE 2.15 (H) 09/19/2020   BILITOT 0.5 08/15/2020   ALKPHOS 71 08/15/2020   AST 14 08/15/2020   ALT 17 08/15/2020   PROT 6.3 08/15/2020   ALBUMIN 3.9 09/19/2020   CALCIUM 8.7 (L) 09/19/2020   ANIONGAP 8 09/19/2020   EGFR 25 (L) 09/07/2020   Lab Results  Component Value Date   CHOL 146 08/15/2020   Lab Results  Component Value Date   HDL 36 (L) 08/15/2020   Lab Results  Component Value Date   LDLCALC 87 08/15/2020   Lab Results  Component Value Date   TRIG 130 08/15/2020   Lab Results  Component Value Date   CHOLHDL 4.1 08/15/2020   Lab Results  Component Value Date   HGBA1C 6.5 12/24/2020      Assessment & Plan:   Avrum was seen today for hypertension.  Diagnoses and all orders for this visit:  Uncontrolled hypertension Not quite at goal, although vastly improved from last week. Asymptomatic. He has bene on max dose of amlodipine for 1 week now. Was also restarted on losartan 2 weeks ago. Discussed will give medications more time to work and recommend follow up in 2-3 weeks to recheck BP and reassess if further adjustment is needed. Discussed diet and salt intake to lower BP. Bring BP log to next visit.   Follow-up: Return in about 3 weeks (around 01/23/2021) for HTN.   The patient indicates understanding of these issues and agrees with the plan.  Gwenlyn Perking, FNP

## 2021-01-17 ENCOUNTER — Ambulatory Visit (INDEPENDENT_AMBULATORY_CARE_PROVIDER_SITE_OTHER): Payer: Medicare Other

## 2021-01-17 DIAGNOSIS — Z0000 Encounter for general adult medical examination without abnormal findings: Secondary | ICD-10-CM

## 2021-01-17 NOTE — Progress Notes (Signed)
MEDICARE ANNUAL WELLNESS VISIT  01/17/2021  Telephone Visit Disclaimer This Medicare AWV was conducted by telephone due to national recommendations for restrictions regarding the COVID-19 Pandemic (e.g. social distancing).  I verified, using two identifiers, that I am speaking with Anthony French or their authorized healthcare agent. I discussed the limitations, risks, security, and privacy concerns of performing an evaluation and management service by telephone and the potential availability of an in-person appointment in the future. The patient expressed understanding and agreed to proceed.  Location of Patient: Home Location of Provider (nurse):  [facility]  Subjective:    Anthony French is a 65 y.o. male patient of Anthony Norlander, DO who had a Medicare Annual Wellness Visit today via telephone. Anthony French is Retired and lives with an adult companion. He has three children, three grandchildren and one great-grandchild.  He reports that he is socially active and does interact with friends/family regularly. He is minimally physically active and enjoys working around the house and in his yard.  Patient Care Team: Anthony Norlander, DO as PCP - General (Family Medicine) Anthony French, [facility] (Pharmacist) Anthony Gerold, MD as Consulting Physician (Nephrology) Anthony Jack, MD as Consulting Physician (Hematology) Anthony Romp, NP as Nurse Practitioner (Nurse Practitioner)  Advanced Directives 01/17/2021 09/01/2019 05/26/2019 05/11/2019  Does Patient Have a Medical Advance Directive? No No No No  Would patient like information on creating a medical advance directive? No - Patient declined No - Patient declined No - Patient declined No - Patient declined    Hospital Utilization Over the Past 12 Months: # of hospitalizations or ER visits: 1 # of surgeries: 0  Review of Systems    Patient reports that his overall health is unchanged compared to last year.  History obtained from  chart review and the patient  Patient Reported Readings (BP, Pulse, CBG, Weight, etc) none  Pain Assessment Pain : No/denies pain     Current Medications & Allergies (verified) Allergies as of 01/17/2021       Reactions   Other Hives   Pt states that he is allergic to an ABT but he can not remember what it is   Sulfa Antibiotics Hives        Medication List        Accurate as of January 17, 2021 11:10 AM. If you have any questions, ask your nurse or doctor.          Accu-Chek Guide test strip Generic drug: glucose blood Test blood sugar 4 times per day, before meals and at bedtime.   amLODipine 10 MG tablet Commonly known as: NORVASC Take 1 tablet (10 mg total) by mouth daily.   atorvastatin 40 MG tablet Commonly known as: LIPITOR TAKE 1 TABLET BY MOUTH  DAILY   blood glucose meter kit and supplies Dispense based on patient and insurance preference. Use up to four times daily as directed. (FOR ICD-10 E10.9, E11.9). Check blood sugar twice a day as directed.   fluconazole 150 MG tablet Commonly known as: DIFLUCAN Take 1 tablet (150 mg total) by mouth once a week for 26 doses.   FreeStyle Libre 2 Reader [place] Use to test blood sugars up to 6 times daily. DX: E11.9   FreeStyle Libre 2 Sensor Misc Use to test blood sugars up to 6 times daily. DX: E11.9   gabapentin 300 MG capsule Commonly known as: NEURONTIN TAKE 1 CAPSULE BY MOUTH IN  THE MORNING AND TAKE 3  CAPSULES BY MOUTH  IN THE  EVENING What changed: See the new instructions.   glipiZIDE 5 MG 24 hr tablet Commonly known as: GLUCOTROL XL TAKE 1 TABLET BY MOUTH  DAILY WITH BREAKFAST   insulin NPH-regular Human (70-30) 100 UNIT/ML injection Inject 30 Units into the skin 2 (two) times daily before a meal.   Insulin Syringe-Needle U-100 31G X 5/16" 0.3 ML Misc Commonly known as: BD Insulin Syringe U/F USE WITH INSULIN TWICE  DAILY DX E11.22   Jublia 10 % Soln Generic drug: Efinaconazole Apply 1  application topically daily.   levothyroxine 50 MCG tablet Commonly known as: SYNTHROID Take 1 tablet (50 mcg total) by mouth daily before breakfast.   losartan 25 MG tablet Commonly known as: COZAAR Take 25 mg by mouth daily.   Pen Needles 31G X 8 MM Misc 1 each by Does not apply route in the morning and at bedtime.   sodium bicarbonate 650 MG tablet Take 650 mg by mouth 2 (two) times daily.   sodium zirconium cyclosilicate 5 g packet Commonly known as: LOKELMA Take 5 g by mouth daily.        History (reviewed): Past Medical History:  Diagnosis Date   Diabetes ([place])    Diabetic neuropathy ([place])    Essential hypertension, benign 06/01/2019   Hyperlipidemia    Past Surgical History:  Procedure Laterality Date   APPENDECTOMY  1980   CYST REMOVAL TRUNK     Family History  Problem Relation Age of Onset   Stroke Mother    Diabetes Mother    Hypertension Mother    Hyperlipidemia Mother    Kidney disease Mother    COPD Father    Diabetes Sister    Stroke Brother    Diabetes Brother    Arthritis Maternal Grandmother    Social History   Socioeconomic History   Marital status: Single    Spouse name: Not on file   Number of children: 3   Years of education: Not on file   Highest education level: Not on file  Occupational History   Occupation: retired  Tobacco Use   Smoking status: Never   Smokeless tobacco: Never  Vaping Use   Vaping Use: Never used  Substance and Sexual Activity   Alcohol use: Not Currently   Drug use: Never   Sexual activity: Not Currently  Other Topics Concern   Not on file  Social History Narrative   Not on file   Social Determinants of Health   Financial Resource Strain: Not on file  Food Insecurity: Not on file  Transportation Needs: Not on file  Physical Activity: Not on file  Stress: Not on file  Social Connections: Not on file    Activities of Daily Living In your present state of health, do you have any difficulty  performing the following activities: 01/17/2021  Hearing? N  Vision? N  Difficulty concentrating or making decisions? N  Walking or climbing stairs? N  Dressing or bathing? N  Doing errands, shopping? N  Preparing Food and eating ? N  Using the Toilet? N  In the past six months, have you accidently leaked urine? N  Do you have problems with loss of bowel control? N  Managing your Medications? N  Managing your Finances? N  Housekeeping or managing your Housekeeping? N  Some recent data might be hidden    Patient Education/ Literacy How often do you need to have someone help you when you read instructions, pamphlets, or other written materials from  your doctor or pharmacy?: 1 - Never What is the last grade level you completed in school?: Some college  Exercise Current Exercise Habits: The patient does not participate in regular exercise at present  Diet Patient reports consuming 3 meals a day and 1 snack(s) a day Patient reports that his primary diet is: Regular Patient reports that he does have regular access to food.   Depression Screen PHQ 2/9 Scores 01/02/2021 12/27/2020 09/07/2020 06/06/2020 02/07/2020 11/02/2019 07/28/2019  PHQ - 2 Score 0 0 2 0 0 0 0  PHQ- 9 Score 0 - 2 - 0 - -     Fall Risk Fall Risk  01/17/2021 01/02/2021 12/27/2020 09/07/2020 06/06/2020  Falls in the past year? 0 0 0 0 0  Number falls in past yr: - - - - -  Injury with Fall? - - - - -  Risk for fall due to : - - - - -  Follow up Falls evaluation completed - - - -     Objective:  Anthony French seemed alert and oriented and he participated appropriately during our telephone visit.  Blood Pressure Weight BMI  BP Readings from Last 3 Encounters:  01/02/21 (!) 149/74  12/27/20 (!) 179/89  12/24/20 (!) 202/77   Wt Readings from Last 3 Encounters:  01/02/21 219 lb (99.3 kg)  12/27/20 219 lb (99.3 kg)  12/24/20 218 lb 12.8 oz (99.2 kg)   BMI Readings from Last 1 Encounters:  01/02/21 32.34 kg/m     *Unable to obtain current vital signs, weight, and BMI due to telephone visit type  Hearing/Vision  Anthony French did not seem to have difficulty with hearing/understanding during the telephone conversation Reports that he has had a formal eye exam by an eye care professional within the past year Reports that he has not had a formal hearing evaluation within the past year *Unable to fully assess hearing and vision during telephone visit type  Cognitive Function: 6CIT Screen 01/17/2021  What Year? 0 points  What month? 0 points  What time? 0 points  Count back from 20 0 points  Months in reverse 0 points  Repeat phrase 0 points  Total Score 0   (Normal:0-7, Significant for Dysfunction: >8)  Normal Cognitive Function Screening: Yes   Immunization & Health Maintenance Record Immunization History  Administered Date(s) Administered   Fluad Quad(high Dose 65+) 11/02/2019, 12/27/2020   Influenza Inj Mdck Quad With Preservative 11/26/2015   Influenza,inj,Quad PF,6+ Mos 12/25/2016   Pneumococcal Conjugate-13 12/27/2018   Pneumococcal Polysaccharide-23 04/16/2017   Tdap 08/19/2017   Zoster Recombinat (Shingrix) 07/28/2019, 09/07/2020    Health Maintenance  Topic Date Due   COVID-19 Vaccine (1) Never done   OPHTHALMOLOGY EXAM  11/21/2020   HEMOGLOBIN A1C  06/23/2021   FOOT EXAM  09/07/2021   Pneumonia Vaccine 35+ Years old (3 - PPSV23 if available, else PCV20) 04/17/2022   Fecal DNA (Cologuard)  07/25/2022   TETANUS/TDAP  08/20/2027   INFLUENZA VACCINE  Completed   Hepatitis C Screening  Completed   Zoster Vaccines- Shingrix  Completed   HPV VACCINES  Aged Out       Assessment  This is a routine wellness examination for [employer].  Health Maintenance: Due or Overdue Health Maintenance Due  Topic Date Due   COVID-19 Vaccine (1) Never done   OPHTHALMOLOGY EXAM  11/21/2020    Anthony French does not need a referral for Community Assistance: Care Management:   no Social  Work:    no  Prescription Assistance:  no Nutrition/Diabetes Education:  no   Plan:  Personalized Goals  Goals Addressed             This Visit's Progress    Patient Stated       01/17/2021 AWV Goal: Exercise for General Health  Patient will verbalize understanding of the benefits of increased physical activity: Exercising regularly is important. It will improve your overall fitness, flexibility, and endurance. Regular exercise also will improve your overall health. It can help you control your weight, reduce stress, and improve your bone density. Over the next year, patient will increase physical activity as tolerated with a goal of at least 150 minutes of moderate physical activity per week.  You can tell that you are exercising at a moderate intensity if your heart starts beating faster and you start breathing faster but can still hold a conversation. Moderate-intensity exercise ideas include: Walking 1 mile (1.6 km) in about 15 minutes Biking Hiking Golfing Dancing Water aerobics Patient will verbalize understanding of everyday activities that increase physical activity by providing examples like the following: Yard work, such as: Sales promotion account executive Gardening Washing windows or floors Patient will be able to explain general safety guidelines for exercising:  Before you start a new exercise program, talk with your health care provider. Do not exercise so much that you hurt yourself, feel dizzy, or get very short of breath. Wear comfortable clothes and wear shoes with good support. Drink plenty of water while you exercise to prevent dehydration or heat stroke. Work out until your breathing and your heartbeat get faster.        Personalized Health Maintenance & Screening Recommendations    Lung Cancer Screening Recommended: no (Low Dose CT Chest recommended if Age 99-80 years, 30  pack-year currently smoking OR have quit w/in past 15 years) Hepatitis C Screening recommended: no HIV Screening recommended: no  Advanced Directives: Written information was not prepared per patient's request.  Referrals & Orders No orders of the defined types were placed in this encounter.   Follow-up Plan Follow-up with Anthony Norlander, DO as planned    I have personally reviewed and noted the following in the patient's chart:   Medical and social history Use of alcohol, tobacco or illicit drugs  Current medications and supplements Functional ability and status Nutritional status Physical activity Advanced directives List of other physicians Hospitalizations, surgeries, and ER visits in previous 12 months Vitals Screenings to include cognitive, depression, and falls Referrals and appointments  In addition, I have reviewed and discussed with Anthony French certain preventive protocols, quality metrics, and best practice recommendations. A written personalized care plan for preventive services as well as general preventive health recommendations is available and can be mailed to the patient at his request.      Felicity Coyer, LPN    [id number]

## 2021-01-23 ENCOUNTER — Encounter: Payer: Self-pay | Admitting: Family Medicine

## 2021-01-23 ENCOUNTER — Ambulatory Visit (INDEPENDENT_AMBULATORY_CARE_PROVIDER_SITE_OTHER): Payer: Medicare Other | Admitting: Family Medicine

## 2021-01-23 VITALS — BP 142/70 | HR 58 | Temp 98.2°F | Ht 69.0 in | Wt 221.4 lb

## 2021-01-23 DIAGNOSIS — I10 Essential (primary) hypertension: Secondary | ICD-10-CM | POA: Diagnosis not present

## 2021-01-23 NOTE — Progress Notes (Signed)
Established Patient Office Visit  Subjective:  Patient ID: Anthony French, male    DOB: 09/14/1955  Age: 65 y.o. MRN: [id number]  CC:  Chief Complaint  Patient presents with   Hypertension    HPI [name] presents for HTN follow up. He has now been on amlodipine 10 mg for 2 weeks. He was restarted on losartan 25 mg 3 weeks ago after having to discontinue it previously for hyperkalemia. He reports doing well. He denies chest pain, shortness of breath, edema, orthopnea, dyspnea, dizziness, palpitations, weakness, or visual disturbances. He has been checking his BP at home. It has been trending down with most reading in the 140s/80s. He will be getting labs done in 2 weeks for his nephrology appointment.    BP Readings from Last 3 Encounters:  01/23/21 (!) 142/70  01/02/21 (!) 149/74  12/27/20 (!) 179/89     Past Medical History:  Diagnosis Date   Diabetes ([place])    Diabetic neuropathy ([place])    Essential hypertension, benign 06/01/2019   Hyperlipidemia     Past Surgical History:  Procedure Laterality Date   APPENDECTOMY  1980   CYST REMOVAL TRUNK      Family History  Problem Relation Age of Onset   Stroke Mother    Diabetes Mother    Hypertension Mother    Hyperlipidemia Mother    Kidney disease Mother    COPD Father    Diabetes Sister    Stroke Brother    Diabetes Brother    Arthritis Maternal Grandmother     Social History   Socioeconomic History   Marital status: Single    Spouse name: Not on file   Number of children: 3   Years of education: Not on file   Highest education level: Not on file  Occupational History   Occupation: retired  Tobacco Use   Smoking status: Never   Smokeless tobacco: Never  Vaping Use   Vaping Use: Never used  Substance and Sexual Activity   Alcohol use: Not Currently   Drug use: Never   Sexual activity: Not Currently  Other Topics Concern   Not on file  Social History Narrative   Not on file   Social Determinants of  Health   Financial Resource Strain: Not on file  Food Insecurity: Not on file  Transportation Needs: Not on file  Physical Activity: Not on file  Stress: Not on file  Social Connections: Not on file  Intimate Partner Violence: Not on file    Outpatient Medications Prior to Visit  Medication Sig Dispense Refill   amLODipine (NORVASC) 10 MG tablet Take 1 tablet (10 mg total) by mouth daily. 90 tablet 1   atorvastatin (LIPITOR) 40 MG tablet TAKE 1 TABLET BY MOUTH  DAILY 90 tablet 0   blood glucose meter kit and supplies Dispense based on patient and insurance preference. Use up to four times daily as directed. (FOR ICD-10 E10.9, E11.9). Check blood sugar twice a day as directed. 1 each 0   Continuous Blood Gluc Receiver (FREESTYLE LIBRE 2 READER) [place] Use to test blood sugars up to 6 times daily. DX: E11.9 1 each 0   Continuous Blood Gluc Sensor (FREESTYLE LIBRE 2 SENSOR) MISC Use to test blood sugars up to 6 times daily. DX: E11.9 6 each 2   Efinaconazole (JUBLIA) 10 % SOLN Apply 1 application topically daily. 4 mL 5   fluconazole (DIFLUCAN) 150 MG tablet Take 1 tablet (150 mg total) by mouth once a  week for 26 doses. 26 tablet 0   gabapentin (NEURONTIN) 300 MG capsule TAKE 1 CAPSULE BY MOUTH IN  THE MORNING AND TAKE 3  CAPSULES BY MOUTH IN THE  EVENING (Patient taking differently: TAKE 1 CAPSULE BY MOUTH IN THE MORNING AND TAKE 2 CAPSULES BY MOUTH IN THE EVENING) 360 capsule 0   glipiZIDE (GLUCOTROL XL) 5 MG 24 hr tablet TAKE 1 TABLET BY MOUTH  DAILY WITH BREAKFAST 90 tablet 0   glucose blood (ACCU-CHEK GUIDE) test strip Test blood sugar 4 times per day, before meals and at bedtime. 500 strip 3   insulin NPH-regular Human (70-30) 100 UNIT/ML injection Inject 30 Units into the skin 2 (two) times daily before a meal.     Insulin Pen Needle (PEN NEEDLES) 31G X 8 MM MISC 1 each by Does not apply route in the morning and at bedtime. 100 each 11   Insulin Syringe-Needle U-100 (BD INSULIN SYRINGE  U/F) 31G X 5/16" 0.3 ML MISC USE WITH INSULIN TWICE  DAILY DX E11.22 200 each 3   levothyroxine (SYNTHROID) 50 MCG tablet Take 1 tablet (50 mcg total) by mouth daily before breakfast. 90 tablet 1   losartan (COZAAR) 25 MG tablet Take 25 mg by mouth daily.     sodium bicarbonate 650 MG tablet Take 650 mg by mouth 2 (two) times daily.     sodium zirconium cyclosilicate (LOKELMA) 5 g packet Take 5 g by mouth daily.     No facility-administered medications prior to visit.    Allergies  Allergen Reactions   Other Hives    Pt states that he is allergic to an ABT but he can not remember what it is   Sulfa Antibiotics Hives    ROS Review of Systems As per HPI.    Objective:    Physical Exam Vitals and nursing note reviewed.  Constitutional:      Appearance: Normal appearance.  Neck:     Vascular: No carotid bruit.  Cardiovascular:     Rate and Rhythm: Normal rate and regular rhythm.     Heart sounds: Normal heart sounds. No murmur heard. Pulmonary:     Effort: Pulmonary effort is normal. No respiratory distress.     Breath sounds: Normal breath sounds.  Abdominal:     General: Bowel sounds are normal.     Palpations: Abdomen is soft.  Musculoskeletal:     Cervical back: Neck supple. No tenderness.     Right lower leg: No edema.     Left lower leg: No edema.  Skin:    General: Skin is warm and dry.  Neurological:     General: No focal deficit present.     Mental Status: He is alert and oriented to person, place, and time.  Psychiatric:        Mood and Affect: Mood normal.        Behavior: Behavior normal.    BP (!) 142/70    Pulse (!) 58    Temp 98.2 F (36.8 C) (Temporal)    Ht 5' 9"  (1.753 m)    Wt 221 lb 6 oz (100.4 kg)    BMI 32.69 kg/m  Wt Readings from Last 3 Encounters:  01/23/21 221 lb 6 oz (100.4 kg)  01/02/21 219 lb (99.3 kg)  12/27/20 219 lb (99.3 kg)     Health Maintenance Due  Topic Date Due   COVID-19 Vaccine (1) Never done   OPHTHALMOLOGY EXAM   11/21/2020    There  are no preventive care reminders to display for this patient.  Lab Results  Component Value Date   TSH 2.880 10/26/2020   Lab Results  Component Value Date   WBC 4.9 08/23/2019   HGB 14.1 08/23/2019   HCT 41.3 08/23/2019   MCV 87 08/23/2019   PLT 150 08/23/2019   Lab Results  Component Value Date   NA 139 09/19/2020   K 4.9 09/19/2020   CO2 24 09/19/2020   GLUCOSE 232 (H) 09/19/2020   BUN 37 (H) 09/19/2020   CREATININE 2.15 (H) 09/19/2020   BILITOT 0.5 08/15/2020   ALKPHOS 71 08/15/2020   AST 14 08/15/2020   ALT 17 08/15/2020   PROT 6.3 08/15/2020   ALBUMIN 3.9 09/19/2020   CALCIUM 8.7 (L) 09/19/2020   ANIONGAP 8 09/19/2020   EGFR 25 (L) 09/07/2020   Lab Results  Component Value Date   CHOL 146 08/15/2020   Lab Results  Component Value Date   HDL 36 (L) 08/15/2020   Lab Results  Component Value Date   LDLCALC 87 08/15/2020   Lab Results  Component Value Date   TRIG 130 08/15/2020   Lab Results  Component Value Date   CHOLHDL 4.1 08/15/2020   Lab Results  Component Value Date   HGBA1C 6.5 12/24/2020      Assessment & Plan:   Akshath was seen today for hypertension.  Diagnoses and all orders for this visit:  Primary hypertension Improving. Slightly above goal today. Has upcoming labs ordered by nephrology. Discussed may be able to increase losartan pending lab results. Continue to check BP at home, notify for elevated reading. Discussed bring BP cuff to next visit to check against the office reading. Low salt diet, exercise. Keep appointment with nephrology and PCP next month.    The patient indicates understanding of these issues and agrees with the plan.    Gwenlyn Perking, FNP

## 2021-01-23 NOTE — Patient Instructions (Signed)

## 2021-02-14 ENCOUNTER — Other Ambulatory Visit: Payer: Commercial Managed Care - HMO

## 2021-02-14 DIAGNOSIS — E875 Hyperkalemia: Secondary | ICD-10-CM | POA: Diagnosis not present

## 2021-02-14 DIAGNOSIS — R809 Proteinuria, unspecified: Secondary | ICD-10-CM | POA: Diagnosis not present

## 2021-02-14 DIAGNOSIS — N189 Chronic kidney disease, unspecified: Secondary | ICD-10-CM | POA: Diagnosis not present

## 2021-02-14 DIAGNOSIS — E1122 Type 2 diabetes mellitus with diabetic chronic kidney disease: Secondary | ICD-10-CM | POA: Diagnosis not present

## 2021-02-14 DIAGNOSIS — E1129 Type 2 diabetes mellitus with other diabetic kidney complication: Secondary | ICD-10-CM | POA: Diagnosis not present

## 2021-02-20 DIAGNOSIS — E113313 Type 2 diabetes mellitus with moderate nonproliferative diabetic retinopathy with macular edema, bilateral: Secondary | ICD-10-CM | POA: Diagnosis not present

## 2021-02-20 DIAGNOSIS — H2513 Age-related nuclear cataract, bilateral: Secondary | ICD-10-CM | POA: Diagnosis not present

## 2021-02-20 DIAGNOSIS — H35033 Hypertensive retinopathy, bilateral: Secondary | ICD-10-CM | POA: Diagnosis not present

## 2021-02-20 DIAGNOSIS — H43823 Vitreomacular adhesion, bilateral: Secondary | ICD-10-CM | POA: Diagnosis not present

## 2021-02-22 DIAGNOSIS — E1129 Type 2 diabetes mellitus with other diabetic kidney complication: Secondary | ICD-10-CM | POA: Diagnosis not present

## 2021-02-22 DIAGNOSIS — E875 Hyperkalemia: Secondary | ICD-10-CM | POA: Diagnosis not present

## 2021-02-22 DIAGNOSIS — N189 Chronic kidney disease, unspecified: Secondary | ICD-10-CM | POA: Diagnosis not present

## 2021-02-22 DIAGNOSIS — E1122 Type 2 diabetes mellitus with diabetic chronic kidney disease: Secondary | ICD-10-CM | POA: Diagnosis not present

## 2021-02-22 DIAGNOSIS — R809 Proteinuria, unspecified: Secondary | ICD-10-CM | POA: Diagnosis not present

## 2021-02-22 DIAGNOSIS — E870 Hyperosmolality and hypernatremia: Secondary | ICD-10-CM | POA: Diagnosis not present

## 2021-02-22 DIAGNOSIS — I129 Hypertensive chronic kidney disease with stage 1 through stage 4 chronic kidney disease, or unspecified chronic kidney disease: Secondary | ICD-10-CM | POA: Diagnosis not present

## 2021-03-12 ENCOUNTER — Ambulatory Visit (INDEPENDENT_AMBULATORY_CARE_PROVIDER_SITE_OTHER): Payer: Medicare Other | Admitting: Family Medicine

## 2021-03-12 ENCOUNTER — Encounter: Payer: Self-pay | Admitting: Family Medicine

## 2021-03-12 ENCOUNTER — Other Ambulatory Visit: Payer: Self-pay | Admitting: Family Medicine

## 2021-03-12 VITALS — BP 139/81 | HR 56 | Temp 98.0°F | Ht 69.0 in | Wt 216.8 lb

## 2021-03-12 DIAGNOSIS — E1159 Type 2 diabetes mellitus with other circulatory complications: Secondary | ICD-10-CM | POA: Diagnosis not present

## 2021-03-12 DIAGNOSIS — E1142 Type 2 diabetes mellitus with diabetic polyneuropathy: Secondary | ICD-10-CM

## 2021-03-12 DIAGNOSIS — E785 Hyperlipidemia, unspecified: Secondary | ICD-10-CM | POA: Diagnosis not present

## 2021-03-12 DIAGNOSIS — I152 Hypertension secondary to endocrine disorders: Secondary | ICD-10-CM | POA: Diagnosis not present

## 2021-03-12 DIAGNOSIS — E1169 Type 2 diabetes mellitus with other specified complication: Secondary | ICD-10-CM | POA: Diagnosis not present

## 2021-03-12 DIAGNOSIS — N1832 Chronic kidney disease, stage 3b: Secondary | ICD-10-CM

## 2021-03-12 DIAGNOSIS — E1122 Type 2 diabetes mellitus with diabetic chronic kidney disease: Secondary | ICD-10-CM | POA: Diagnosis not present

## 2021-03-12 DIAGNOSIS — Z794 Long term (current) use of insulin: Secondary | ICD-10-CM

## 2021-03-12 NOTE — Progress Notes (Signed)
Subjective: CC:DM PCP: Janora Norlander, DO WUJ:[id number] Anthony French is a 66 y.o. male presenting to clinic today for:  1. Type 2 Diabetes with hypertension, hyperlipidemia:  Last seen by endo 12/24/2020.  BGs well controlled w/ A1C 6.5 that visit.  He continues to see his retinal specialist for eye injections but is currently stable and they have not required further injection.  He had diabetic retinal exam done and there.  Recently chlorthalidone 25 mg was added.  He denies any increased urinary frequency, chest pain.  He is compliant with Lipitor, Cozaar and Norvasc.  Not on Lokelma currently  Last eye exam: UTD Last foot exam: UTD Last A1c:  Lab Results  Component Value Date   HGBA1C 6.5 12/24/2020   Nephropathy screen indicated?: on ARB Last flu, zoster and/or pneumovax:  Immunization History  Administered Date(s) Administered   Fluad Quad(high Dose 65+) 11/02/2019, 12/27/2020   Influenza Inj Mdck Quad With Preservative 11/26/2015   Influenza,inj,Quad PF,6+ Mos 12/25/2016   Pneumococcal Conjugate-13 12/27/2018   Pneumococcal Polysaccharide-23 04/16/2017   Tdap 08/19/2017   Zoster Recombinat (Shingrix) 07/28/2019, 09/07/2020    ROS: Denies dizziness, LOC, polyuria, polydipsia, unintended weight loss/gain, foot ulcerations     ROS: Per HPI  Allergies  Allergen Reactions   Other Hives    Pt states that he is allergic to an ABT but he can not remember what it is   Sulfa Antibiotics Hives   Past Medical History:  Diagnosis Date   Diabetes ([place])    Diabetic neuropathy ([place])    Essential hypertension, benign 06/01/2019   Hyperlipidemia     Current Outpatient Medications:    amLODipine (NORVASC) 10 MG tablet, Take 1 tablet (10 mg total) by mouth daily., Disp: 90 tablet, Rfl: 1   atorvastatin (LIPITOR) 40 MG tablet, TAKE 1 TABLET BY MOUTH  DAILY, Disp: 90 tablet, Rfl: 0   blood glucose meter kit and supplies, Dispense based on patient and insurance preference. Use up to  four times daily as directed. (FOR ICD-10 E10.9, E11.9). Check blood sugar twice a day as directed., Disp: 1 each, Rfl: 0   Continuous Blood Gluc Receiver (FREESTYLE LIBRE 2 READER) DEVI, Use to test blood sugars up to 6 times daily. DX: E11.9, Disp: 1 each, Rfl: 0   Continuous Blood Gluc Sensor (FREESTYLE LIBRE 2 SENSOR) MISC, Use to test blood sugars up to 6 times daily. DX: E11.9, Disp: 6 each, Rfl: 2   Efinaconazole (JUBLIA) 10 % SOLN, Apply 1 application topically daily., Disp: 4 mL, Rfl: 5   gabapentin (NEURONTIN) 300 MG capsule, TAKE 1 CAPSULE BY MOUTH IN  THE MORNING AND TAKE 3  CAPSULES BY MOUTH IN THE  EVENING (Patient taking differently: TAKE 1 CAPSULE BY MOUTH IN THE MORNING AND TAKE 2 CAPSULES BY MOUTH IN THE EVENING), Disp: 360 capsule, Rfl: 0   glipiZIDE (GLUCOTROL XL) 5 MG 24 hr tablet, TAKE 1 TABLET BY MOUTH  DAILY WITH BREAKFAST, Disp: 90 tablet, Rfl: 0   glucose blood (ACCU-CHEK GUIDE) test strip, Test blood sugar 4 times per day, before meals and at bedtime., Disp: 500 strip, Rfl: 3   insulin NPH-regular Human (70-30) 100 UNIT/ML injection, Inject 30 Units into the skin 2 (two) times daily before a meal., Disp: , Rfl:    Insulin Pen Needle (PEN NEEDLES) 31G X 8 MM MISC, 1 each by Does not apply route in the morning and at bedtime., Disp: 100 each, Rfl: 11   Insulin Syringe-Needle U-100 (BD INSULIN SYRINGE  U/F) 31G X 5/16" 0.3 ML MISC, USE WITH INSULIN TWICE  DAILY DX E11.22, Disp: 200 each, Rfl: 3   levothyroxine (SYNTHROID) 50 MCG tablet, Take 1 tablet (50 mcg total) by mouth daily before breakfast., Disp: 90 tablet, Rfl: 1   losartan (COZAAR) 25 MG tablet, Take 25 mg by mouth daily., Disp: , Rfl:    sodium bicarbonate 650 MG tablet, Take 650 mg by mouth 2 (two) times daily., Disp: , Rfl:    sodium zirconium cyclosilicate (LOKELMA) 5 g packet, Take 5 g by mouth daily. (Patient not taking: Reported on 03/12/2021), Disp: , Rfl:  Social History   Socioeconomic History   Marital  status: Single    Spouse name: Not on file   Number of children: 3   Years of education: Not on file   Highest education level: Not on file  Occupational History   Occupation: retired  Tobacco Use   Smoking status: Never   Smokeless tobacco: Never  Vaping Use   Vaping Use: Never used  Substance and Sexual Activity   Alcohol use: Not Currently   Drug use: Never   Sexual activity: Not Currently  Other Topics Concern   Not on file  Social History Narrative   Not on file   Social Determinants of Health   Financial Resource Strain: Not on file  Food Insecurity: Not on file  Transportation Needs: Not on file  Physical Activity: Not on file  Stress: Not on file  Social Connections: Not on file  Intimate Partner Violence: Not on file   Family History  Problem Relation Age of Onset   Stroke Mother    Diabetes Mother    Hypertension Mother    Hyperlipidemia Mother    Kidney disease Mother    COPD Father    Diabetes Sister    Stroke Brother    Diabetes Brother    Arthritis Maternal Grandmother     Objective: Office vital signs reviewed. BP 139/81    Pulse (!) 56    Temp 98 F (36.7 C)    Ht 5' 9"  (1.753 m)    Wt 216 lb 12.8 oz (98.3 kg)    SpO2 99%    BMI 32.02 kg/m   Physical Examination:  General: Awake, alert, well nourished, No acute distress HEENT: Sclera white Cardio: slightly bradycardic with regular rhythm, S1S2 heard, no murmurs appreciated Pulm: clear to auscultation bilaterally, no wheezes, rhonchi or rales; normal work of breathing on room air Extremities: warm, well perfused, No edema, cyanosis or clubbing; +2 pulses bilaterally MSK: Ambulating independently with normal gait and station  Assessment/ Plan: 66 y.o. male   Type 2 diabetes mellitus with stage 3b chronic kidney disease, with long-term current use of insulin (HCC)  Diabetic polyneuropathy associated with type 2 diabetes mellitus ([place])  Hypertension associated with diabetes  ([place])  Hyperlipidemia associated with type 2 diabetes mellitus ([place])  Sugar remains under excellent control.  Continue to follow-up with endocrinology and nephrology as scheduled.  He has labs and appointment coming up with Dr. Theador Hawthorne so these were not collected today.  Diabetic neuropathy is stable with use of gabapentin  Blood pressure is well controlled.  Though there appears to be a big discrepancy between his machine and ours with his being about 40 points higher than ours measured.  Encouraged him to get a new monitor  Would like to collect fasting lipid panel at his next visit in 6 months.  Continue Lipitor   No orders of the  defined types were placed in this encounter.  No orders of the defined types were placed in this encounter.    Janora Norlander, DO [place] [phone]

## 2021-03-19 ENCOUNTER — Other Ambulatory Visit: Payer: Self-pay | Admitting: Family Medicine

## 2021-03-20 ENCOUNTER — Other Ambulatory Visit: Payer: Medicare Other

## 2021-03-20 DIAGNOSIS — E1129 Type 2 diabetes mellitus with other diabetic kidney complication: Secondary | ICD-10-CM | POA: Diagnosis not present

## 2021-03-20 DIAGNOSIS — E119 Type 2 diabetes mellitus without complications: Secondary | ICD-10-CM | POA: Diagnosis not present

## 2021-03-20 DIAGNOSIS — N189 Chronic kidney disease, unspecified: Secondary | ICD-10-CM | POA: Diagnosis not present

## 2021-03-20 DIAGNOSIS — Z794 Long term (current) use of insulin: Secondary | ICD-10-CM | POA: Diagnosis not present

## 2021-03-20 DIAGNOSIS — R809 Proteinuria, unspecified: Secondary | ICD-10-CM | POA: Diagnosis not present

## 2021-03-20 DIAGNOSIS — E1122 Type 2 diabetes mellitus with diabetic chronic kidney disease: Secondary | ICD-10-CM | POA: Diagnosis not present

## 2021-03-20 DIAGNOSIS — I129 Hypertensive chronic kidney disease with stage 1 through stage 4 chronic kidney disease, or unspecified chronic kidney disease: Secondary | ICD-10-CM | POA: Diagnosis not present

## 2021-03-26 DIAGNOSIS — E875 Hyperkalemia: Secondary | ICD-10-CM | POA: Diagnosis not present

## 2021-03-26 DIAGNOSIS — I129 Hypertensive chronic kidney disease with stage 1 through stage 4 chronic kidney disease, or unspecified chronic kidney disease: Secondary | ICD-10-CM | POA: Diagnosis not present

## 2021-03-26 DIAGNOSIS — E1129 Type 2 diabetes mellitus with other diabetic kidney complication: Secondary | ICD-10-CM | POA: Diagnosis not present

## 2021-03-26 DIAGNOSIS — E8722 Chronic metabolic acidosis: Secondary | ICD-10-CM | POA: Diagnosis not present

## 2021-03-26 DIAGNOSIS — E870 Hyperosmolality and hypernatremia: Secondary | ICD-10-CM | POA: Diagnosis not present

## 2021-03-26 DIAGNOSIS — E559 Vitamin D deficiency, unspecified: Secondary | ICD-10-CM | POA: Diagnosis not present

## 2021-03-26 DIAGNOSIS — R809 Proteinuria, unspecified: Secondary | ICD-10-CM | POA: Diagnosis not present

## 2021-03-26 DIAGNOSIS — E1122 Type 2 diabetes mellitus with diabetic chronic kidney disease: Secondary | ICD-10-CM | POA: Diagnosis not present

## 2021-03-26 DIAGNOSIS — N189 Chronic kidney disease, unspecified: Secondary | ICD-10-CM | POA: Diagnosis not present

## 2021-03-27 IMAGING — US US RENAL
1 series · 14 of 25 positions shown · non-contrast
Comparison: None.

CLINICAL DATA: Chronic kidney disease, proteinuria

EXAM:
RENAL / URINARY TRACT ULTRASOUND COMPLETE

[Series 1: us renal · 14 of 53 slices shown]
[im 1/53]
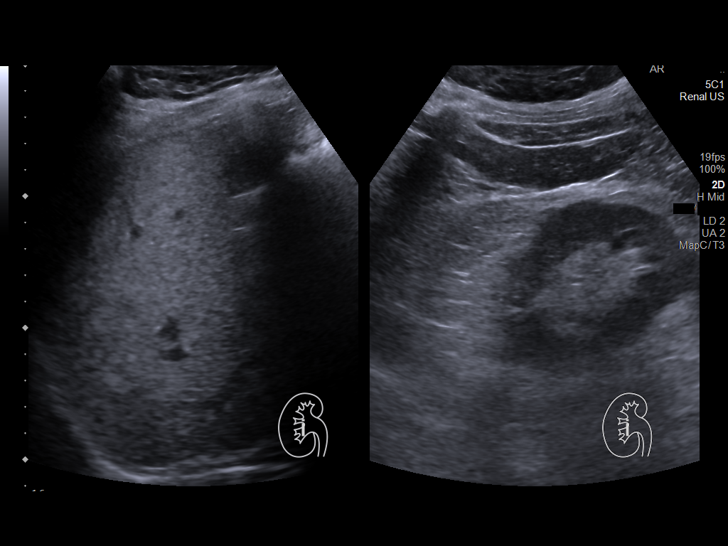
[im 5/53]
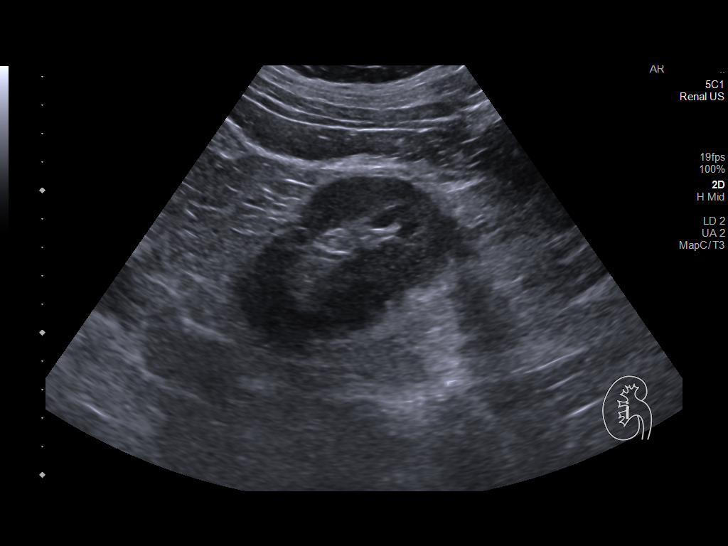
[im 9/53]
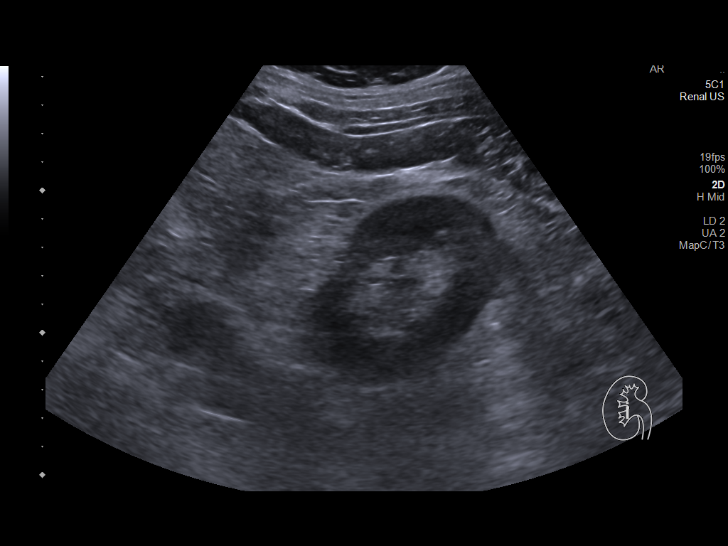
[im 14/53]
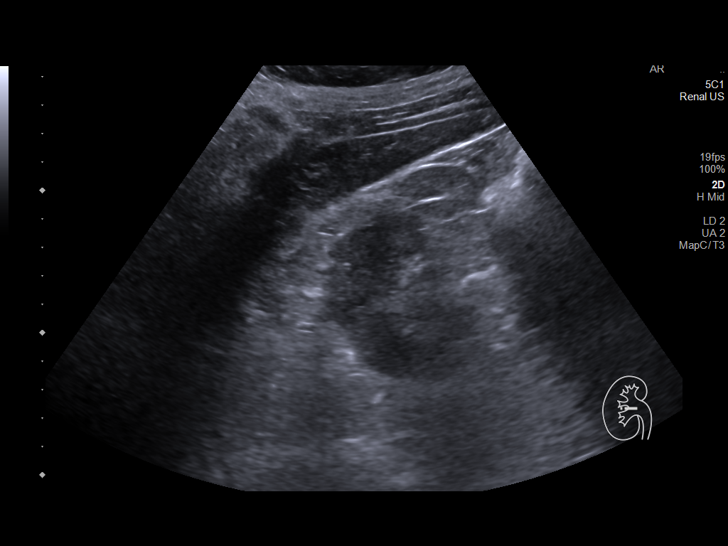
[im 18/53]
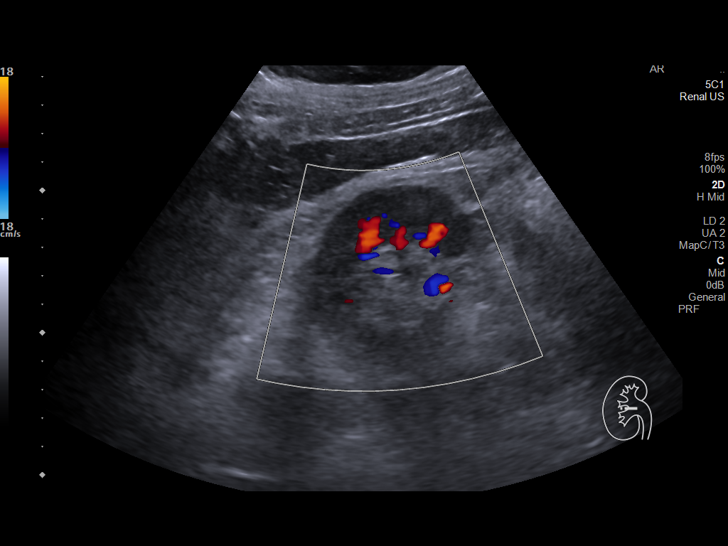
[im 20/53]
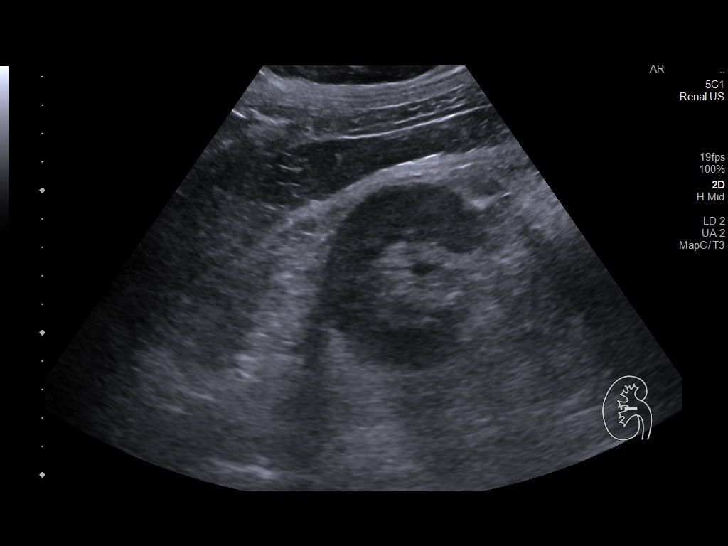
[im 24/53]
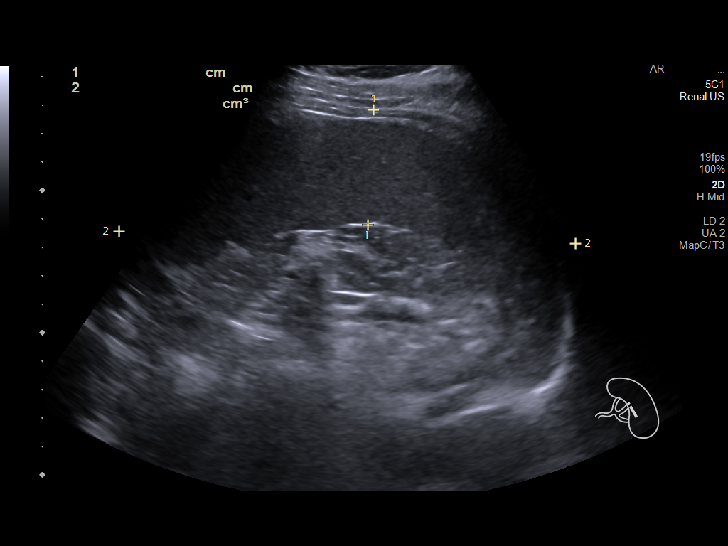
[im 29/53]
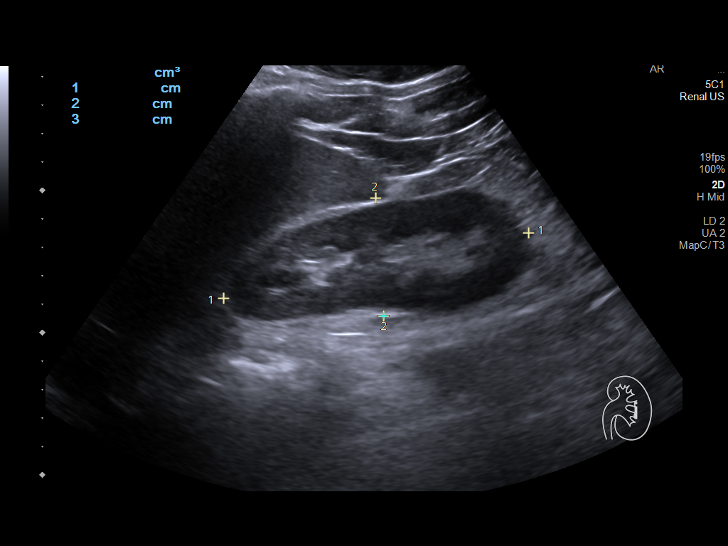
[im 33/53]
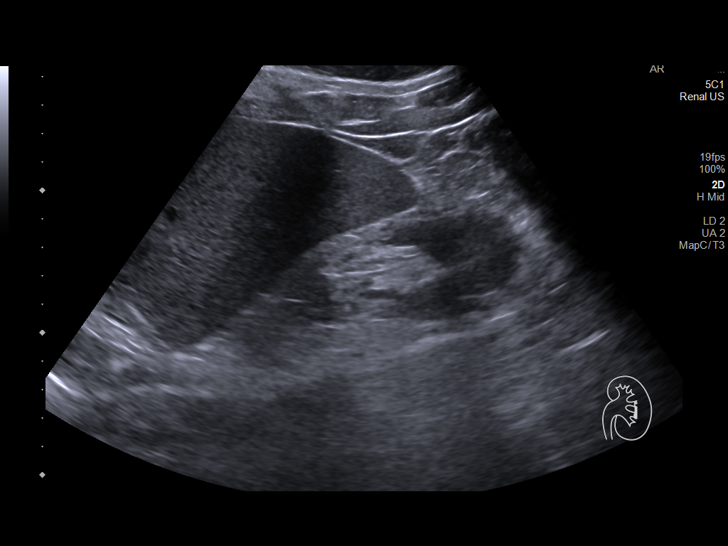
[im 35/53]
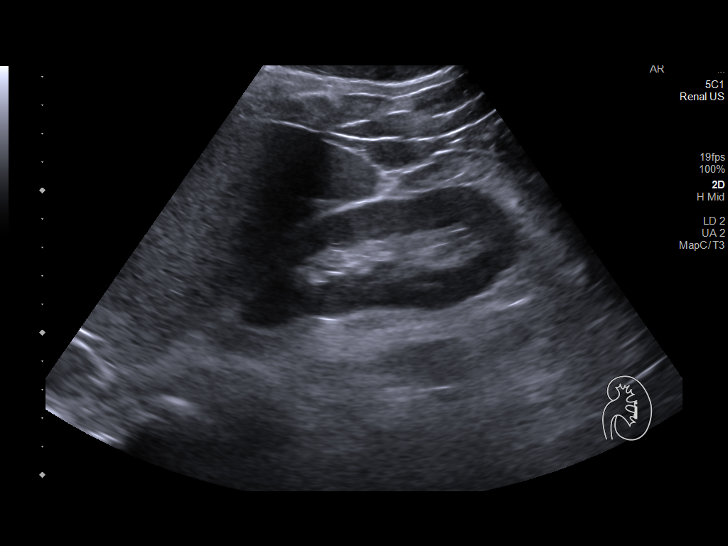
[im 40/53]
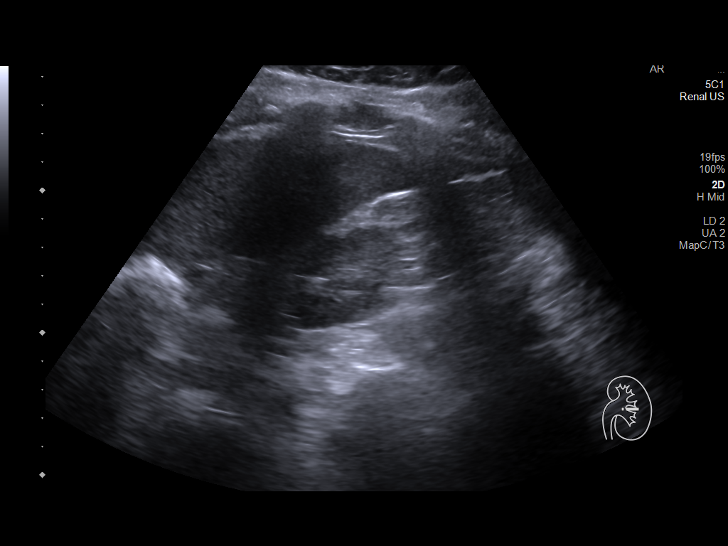
[im 44/53]
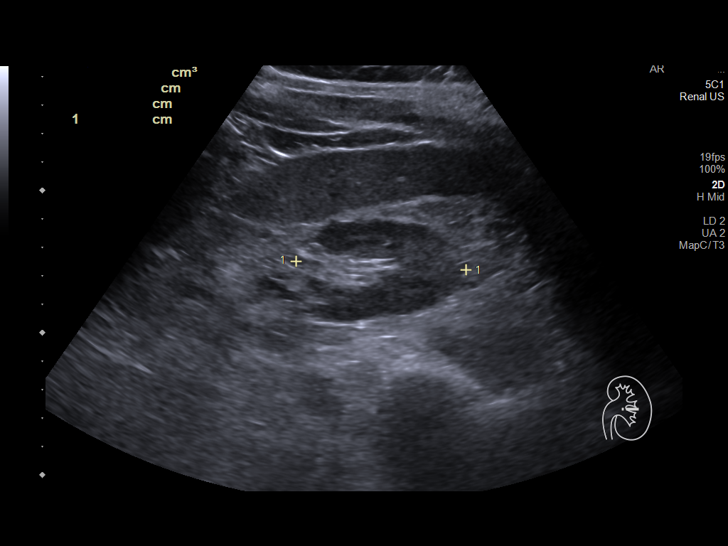
[im 48/53]
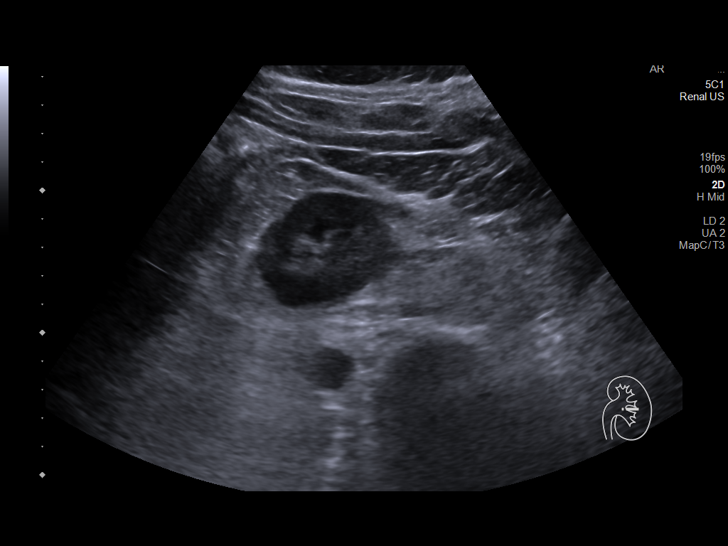
[im 53/53]
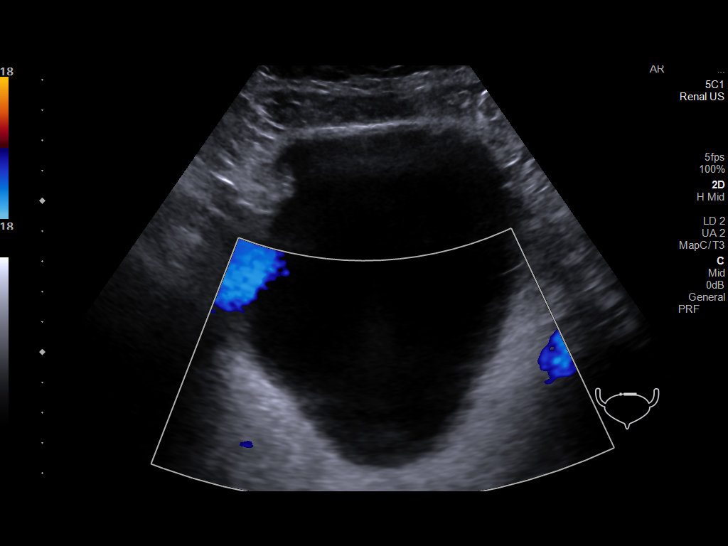

[14 of 25 positions shown; findings below may reference images not displayed]

FINDINGS: Right Kidney:

Renal measurements: 8.6 x 5.5 x 5.6 = volume: 140.43 mL .
Echogenicity within normal limits. No mass or hydronephrosis
visualized.

Left Kidney:

Renal measurements: 11.0 x 4.0 x 6.0 = volume: 142.76 mL.
Echogenicity within normal limits. No mass or hydronephrosis
visualized.

Bladder:

Appears normal for degree of bladder distention.

Other:

Incidental note made of splenomegaly. Splenic length of 16.2 cm with
an approximate volume of 551 mL.
IMPRESSION: Unremarkable renal ultrasound.

Splenomegaly is noted incidentally.

## 2021-04-01 ENCOUNTER — Other Ambulatory Visit: Payer: Self-pay

## 2021-04-01 ENCOUNTER — Encounter: Payer: Self-pay | Admitting: Podiatry

## 2021-04-01 ENCOUNTER — Ambulatory Visit (INDEPENDENT_AMBULATORY_CARE_PROVIDER_SITE_OTHER): Payer: Medicare Other | Admitting: Podiatry

## 2021-04-01 DIAGNOSIS — Z794 Long term (current) use of insulin: Secondary | ICD-10-CM

## 2021-04-01 DIAGNOSIS — M79675 Pain in left toe(s): Secondary | ICD-10-CM | POA: Diagnosis not present

## 2021-04-01 DIAGNOSIS — N1832 Chronic kidney disease, stage 3b: Secondary | ICD-10-CM

## 2021-04-01 DIAGNOSIS — B351 Tinea unguium: Secondary | ICD-10-CM

## 2021-04-01 DIAGNOSIS — E1122 Type 2 diabetes mellitus with diabetic chronic kidney disease: Secondary | ICD-10-CM

## 2021-04-01 DIAGNOSIS — M79674 Pain in right toe(s): Secondary | ICD-10-CM

## 2021-04-02 ENCOUNTER — Other Ambulatory Visit: Payer: Medicare Other

## 2021-04-02 DIAGNOSIS — E1122 Type 2 diabetes mellitus with diabetic chronic kidney disease: Secondary | ICD-10-CM | POA: Diagnosis not present

## 2021-04-02 DIAGNOSIS — N189 Chronic kidney disease, unspecified: Secondary | ICD-10-CM | POA: Diagnosis not present

## 2021-04-02 DIAGNOSIS — I129 Hypertensive chronic kidney disease with stage 1 through stage 4 chronic kidney disease, or unspecified chronic kidney disease: Secondary | ICD-10-CM | POA: Diagnosis not present

## 2021-04-02 DIAGNOSIS — R809 Proteinuria, unspecified: Secondary | ICD-10-CM | POA: Diagnosis not present

## 2021-04-02 DIAGNOSIS — E1129 Type 2 diabetes mellitus with other diabetic kidney complication: Secondary | ICD-10-CM | POA: Diagnosis not present

## 2021-04-07 NOTE — Progress Notes (Signed)
Subjective: Anthony French is a 66 y.o. male patient seen today for follow up of  at risk foot care. Pt has h/o NIDDM with chronic kidney disease and thick, elongated toenail of L hallux which is tender when wearing enclosed shoe gear..   Patient states blood glucose was 337 mg/dl today.   New problem(s)/concern(s) today: None    PCP is Janora Norlander, DO. Last visit was: 03/12/2021.  Allergies  Allergen Reactions   Other Hives    Pt states that he is allergic to an ABT but he can not remember what it is   Sulfa Antibiotics Hives    Objective: Physical Exam  General: Patient is a pleasant 66 y.o. Caucasian male in NAD. AAO x 3.   Neurovascular Examination: Capillary refill time to digits immediate b/l. Palpable pedal pulses b/l LE. Pedal hair present. No pain with calf compression b/l. Lower extremity skin temperature gradient within normal limits. No edema noted b/l LE. No cyanosis or clubbing noted b/l LE.  Protective sensation intact 5/5 intact bilaterally with 10g monofilament b/l. Vibratory sensation intact b/l.  Dermatological:  Pedal integument with normal turgor, texture and tone BLE. No open wounds b/l LE. No interdigital macerations noted b/l LE. Toenails L hallux elongated, discolored, dystrophic, thickened, and crumbly with subungual debris and tenderness to dorsal palpation. Nondystrophic toenails 2-5 bilaterally and R hallux.  Musculoskeletal:  Muscle strength 5/5 to all lower extremity muscle groups bilaterally. No pain, crepitus or joint limitation noted with ROM bilateral LE. No gross bony deformities bilaterally. Patient ambulates independent of any assistive aids.  Assessment: 1. Pain due to onychomycosis of toenails of both feet   2. Type 2 diabetes mellitus with stage 3b chronic kidney disease, with long-term current use of insulin ([place])     Plan: Patient was evaluated and treated and all questions answered. Consent given for treatment as described  below: -Mycotic toenails L hallux were debrided in length and girth with sterile nail nippers and dremel without iatrogenic bleeding. -Nondystrophic toenails trimmed 2-5 bilaterally and R hallux. -Patient/POA to call should there be question/concern in the interim.  Return in about 3 months (around 06/29/2021).  Marzetta Board, DPM

## 2021-04-17 ENCOUNTER — Other Ambulatory Visit: Payer: Medicare Other

## 2021-04-17 DIAGNOSIS — E039 Hypothyroidism, unspecified: Secondary | ICD-10-CM | POA: Diagnosis not present

## 2021-04-18 LAB — TSH: TSH: 4.13 u[IU]/mL (ref 0.450–4.500)

## 2021-04-18 LAB — T4, FREE: Free T4: 1.26 ng/dL (ref 0.82–1.77)

## 2021-04-20 DIAGNOSIS — E119 Type 2 diabetes mellitus without complications: Secondary | ICD-10-CM | POA: Diagnosis not present

## 2021-04-20 DIAGNOSIS — Z794 Long term (current) use of insulin: Secondary | ICD-10-CM | POA: Diagnosis not present

## 2021-04-23 ENCOUNTER — Encounter: Payer: Self-pay | Admitting: Nurse Practitioner

## 2021-04-23 ENCOUNTER — Other Ambulatory Visit: Payer: Self-pay

## 2021-04-23 ENCOUNTER — Ambulatory Visit (INDEPENDENT_AMBULATORY_CARE_PROVIDER_SITE_OTHER): Payer: Medicare Other | Admitting: Nurse Practitioner

## 2021-04-23 VITALS — BP 153/69 | HR 74 | Ht 69.0 in | Wt 219.0 lb

## 2021-04-23 DIAGNOSIS — E039 Hypothyroidism, unspecified: Secondary | ICD-10-CM | POA: Diagnosis not present

## 2021-04-23 DIAGNOSIS — E782 Mixed hyperlipidemia: Secondary | ICD-10-CM | POA: Diagnosis not present

## 2021-04-23 DIAGNOSIS — I10 Essential (primary) hypertension: Secondary | ICD-10-CM

## 2021-04-23 DIAGNOSIS — N184 Chronic kidney disease, stage 4 (severe): Secondary | ICD-10-CM

## 2021-04-23 DIAGNOSIS — E1122 Type 2 diabetes mellitus with diabetic chronic kidney disease: Secondary | ICD-10-CM | POA: Diagnosis not present

## 2021-04-23 DIAGNOSIS — Z794 Long term (current) use of insulin: Secondary | ICD-10-CM | POA: Diagnosis not present

## 2021-04-23 DIAGNOSIS — E559 Vitamin D deficiency, unspecified: Secondary | ICD-10-CM

## 2021-04-23 LAB — POCT GLYCOSYLATED HEMOGLOBIN (HGB A1C): HbA1c, POC (controlled diabetic range): 6 % (ref 0.0–7.0)

## 2021-04-23 NOTE — Patient Instructions (Signed)
Diabetes Mellitus Emergency Preparedness Plan ?A diabetes emergency preparedness plan is a checklist to make sure you have everything you need to manage your diabetes in case of an emergency, such as an evacuation, natural disaster, national security emergency, or pandemic lockdown. ?Managing your diabetes is something you have to do all day every day. The American Diabetes Association and the American College of Endocrinology both recommend putting together an emergency diabetes kit. Your kit should include important information and documents as well as all the supplies you will need to manage your diabetes for at least 1 week. Store it in a portable, waterproof bag or container. The best time to start making your emergency kit is now. ?How to make your emergency kit ?Collect information and documents ?Include the following information and documents in your kit: ?The type of diabetes you have. ?A copy of your health insurance cards and photo ID. ?A list of all your other medical conditions, allergies, and surgeries. ?A list of all your medicines and doses with the contact information for your pharmacy. Ask your health care provider for a list of your current medicines. ?Any recent lab results, including your latest hemoglobin A1C (HbA1C). ?The make, model, and serial number of your insulin pump, if you use one. Also include contact information for the manufacturer. ?Contact information for people who should be notified in case of an emergency. Include your health care provider's name, address, and phone number. ?Collect diabetes care items ?Include the following diabetes care items in your kit: ?At least a 1-week supply of: ?Oral medicines. ?Insulin. ?Blood glucose testing supplies. These include testing strips, lancets, and extra batteries for your blood glucose monitor and pump. ?A charger for the continuous glucose monitor (CGM) receiver and pump. ?Any extra supplies needed for your CGM or pump. ?A supply of  glucagon, glucose tablets, juice, soda, or hard candy in case of hypoglycemia. ?Coolers or cold packs. ?A safe container for syringes, needles, and lancets. ? ?Other preparations ?Other things to consider doing as part of your emergency plan: ?Make sure that your mobile phone is charged and that you have an extra charger, cable, or batteries. ?Choose a meeting place for family members. ?Wear a medical alert or ID bracelet. ?If you have a child with diabetes, make sure your child's school has a copy of his or her emergency plan, including the name of the staff member who will assist your child. ?Where to find more information ?American Diabetes Association: www.diabetes.org ?Centers for Disease Control and Prevention: blogs.cdc.gov ?Summary ?A diabetes emergency preparedness plan is a checklist to make sure you have everything you need in case of an emergency. ?Your kit should include important information and documents as well as all the supplies you will need to manage your condition for at least 1 week. ?Store your kit in a portable, waterproof bag or container. ?The best time to start making your emergency kit is now. ?This information is not intended to replace advice given to you by your health care provider. Make sure you discuss any questions you have with your health care provider. ?Document Revised: 08/04/2019 Document Reviewed: 08/04/2019 ?Elsevier Patient Education ? 2022 Elsevier Inc. ? ?

## 2021-04-23 NOTE — Progress Notes (Signed)
? ? ?     04/23/2021, 11:03 AM ? ?Endocrinology follow-up note ? ? ?Subjective:  ? ? Patient ID: Anthony French, male    DOB: 01/02/1956.  ?Anthony French is being seen in follow-up after he was seen in consultation for management of currently uncontrolled symptomatic diabetes requested by  Anthony Norlander, DO. ? ? ?Past Medical History:  ?Diagnosis Date  ? Diabetes ([place])   ? Diabetic neuropathy ([place])   ? Essential hypertension, benign 06/01/2019  ? Hyperlipidemia   ? ? ?Past Surgical History:  ?Procedure Laterality Date  ? APPENDECTOMY  1980  ? CYST REMOVAL TRUNK    ? ? ?Social History  ? ?Socioeconomic History  ? Marital status: Single  ?  Spouse name: Not on file  ? Number of children: 3  ? Years of education: Not on file  ? Highest education level: Not on file  ?Occupational History  ? Occupation: retired  ?Tobacco Use  ? Smoking status: Never  ? Smokeless tobacco: Never  ?Vaping Use  ? Vaping Use: Never used  ?Substance and Sexual Activity  ? Alcohol use: Not Currently  ? Drug use: Never  ? Sexual activity: Not Currently  ?Other Topics Concern  ? Not on file  ?Social History Narrative  ? Not on file  ? ?Social Determinants of Health  ? ?Financial Resource Strain: Not on file  ?Food Insecurity: Not on file  ?Transportation Needs: Not on file  ?Physical Activity: Not on file  ?Stress: Not on file  ?Social Connections: Not on file  ? ? ?Family History  ?Problem Relation Age of Onset  ? Stroke Mother   ? Diabetes Mother   ? Hypertension Mother   ? Hyperlipidemia Mother   ? Kidney disease Mother   ? COPD Father   ? Diabetes Sister   ? Stroke Brother   ? Diabetes Brother   ? Arthritis Maternal Grandmother   ? ? ?Outpatient Encounter Medications as of 04/23/2021  ?Medication Sig  ? amLODipine (NORVASC) 10 MG tablet Take 1 tablet (10 mg total) by mouth daily.  ? atorvastatin (LIPITOR) 40 MG tablet TAKE 1 TABLET BY MOUTH DAILY  ? blood glucose meter kit and supplies Dispense based on patient and insurance  preference. Use up to four times daily as directed. (FOR ICD-10 E10.9, E11.9). ?Check blood sugar twice a day as directed.  ? chlorthalidone (HYGROTON) 25 MG tablet Take 25 mg by mouth daily.  ? Cholecalciferol 25 MCG (1000 UT) capsule Take by mouth.  ? Continuous Blood Gluc Receiver (FREESTYLE LIBRE 2 READER) DEVI Use to test blood sugars up to 6 times daily. DX: E11.9  ? Continuous Blood Gluc Sensor (FREESTYLE LIBRE 2 SENSOR) MISC Use to test blood sugars up to 6 times daily. DX: E11.9  ? [facility] D3 25 MCG (1000 UT) capsule Take 1,000 Units by mouth daily.  ? Efinaconazole (JUBLIA) 10 % SOLN Apply 1 application topically daily.  ? gabapentin (NEURONTIN) 300 MG capsule TAKE 1 CAPSULE BY MOUTH IN THE  MORNING AND TAKE 3 CAPSULES BY  MOUTH IN THE EVENING  ? glipiZIDE (GLUCOTROL XL) 5 MG 24 hr tablet TAKE 1 TABLET BY MOUTH DAILY  WITH BREAKFAST  ? glucose blood (ACCU-CHEK GUIDE) test strip Test blood sugar 4 times per day, before meals and at bedtime.  ? hydrALAZINE (APRESOLINE) 50 MG tablet Take 50 mg by mouth 2 (two) times daily.  ? insulin NPH-regular Human (70-30) 100 UNIT/ML injection Inject 30 Units into the skin 2 (two)  times daily before a meal.  ? Insulin Pen Needle (PEN NEEDLES) 31G X 8 MM MISC 1 each by Does not apply route in the morning and at bedtime.  ? Insulin Syringe-Needle U-100 (BD INSULIN SYRINGE U/F) 31G X 5/16" 0.3 ML MISC USE WITH INSULIN TWICE  DAILY DX E11.22  ? levothyroxine (SYNTHROID) 50 MCG tablet Take 1 tablet (50 mcg total) by mouth daily before breakfast.  ? losartan (COZAAR) 25 MG tablet Take 25 mg by mouth daily.  ? losartan (COZAAR) 50 MG tablet Take 50 mg by mouth daily.  ? sodium bicarbonate 650 MG tablet Take 650 mg by mouth 2 (two) times daily.  ? sodium zirconium cyclosilicate (LOKELMA) 5 g packet Take 5 g by mouth 3 (three) times a week. Monday, Wednesday, and Friday  ? [DISCONTINUED] hydrALAZINE (APRESOLINE) 50 MG tablet Take by mouth. (Patient not taking: Reported on 04/23/2021)   ? [DISCONTINUED] sodium zirconium cyclosilicate (LOKELMA) 5 g packet Take 5 g by mouth daily. (Patient not taking: Reported on 04/23/2021)  ? ?No facility-administered encounter medications on file as of 04/23/2021.  ? ? ?ALLERGIES: ?Allergies  ?Allergen Reactions  ? Other Hives  ?  Pt states that he is allergic to an ABT but he can not remember what it is  ? Sulfa Antibiotics Hives  ? ? ?VACCINATION STATUS: ?Immunization History  ?Administered Date(s) Administered  ? Fluad Quad(high Dose 65+) 11/02/2019, 12/27/2020  ? Influenza Inj Mdck Quad With Preservative 11/26/2015  ? Influenza,inj,Quad PF,6+ Mos 12/25/2016  ? Pneumococcal Conjugate-13 12/27/2018  ? Pneumococcal Polysaccharide-23 04/16/2017  ? Tdap 08/19/2017  ? Zoster Recombinat (Shingrix) 07/28/2019, 09/07/2020  ? ? ?Diabetes ?He presents for his follow-up diabetic visit. He has type 2 diabetes mellitus. Onset time: He was diagnosed at approximate age of 5 years. His disease course has been stable. There are no hypoglycemic associated symptoms. Pertinent negatives for hypoglycemia include no headaches, nervousness/anxiousness, pallor or tremors. Pertinent negatives for diabetes include no chest pain, no fatigue, no polydipsia, no polyphagia, no polyuria and no weight loss. There are no hypoglycemic complications. Symptoms are stable. Diabetic complications include nephropathy, peripheral neuropathy and retinopathy. Risk factors for coronary artery disease include dyslipidemia, diabetes mellitus, hypertension, male sex, sedentary lifestyle and family history. Current diabetic treatment includes insulin injections and oral agent (monotherapy). He is compliant with treatment most of the time. His weight is fluctuating minimally. He is following a generally healthy diet. When asked about meal planning, he reported none. He has had a previous visit with a dietitian. He never participates in exercise. His home blood glucose trend is fluctuating minimally. His  overall blood glucose range is 140-180 mg/dl. (He presents today with his CGM and logs showing at target glycemic profile overall.  His POCT A1c today is 6%, improving from last visit of 6.5%.  He denies any significant hypoglycemia.  Analysis of his CGM shows TIR 56%, TAR 44%, TBR 0% with a GMI of 7.5%.) An ACE inhibitor/angiotensin II receptor blocker is being taken. He does not see a podiatrist.Eye exam is current.  ?Hyperlipidemia ?This is a chronic problem. The current episode started more than 1 year ago. The problem is controlled. Recent lipid tests were reviewed and are normal. Exacerbating diseases include chronic renal disease, diabetes and hypothyroidism. There are no known factors aggravating his hyperlipidemia. Pertinent negatives include no chest pain, myalgias or shortness of breath. Current antihyperlipidemic treatment includes statins. The current treatment provides significant improvement of lipids. There are no compliance problems.  Risk factors for  coronary artery disease include diabetes mellitus, dyslipidemia, hypertension, male sex and a sedentary lifestyle.  ?Hypertension ?This is a chronic problem. The current episode started more than 1 year ago. The problem has been gradually improving since onset. The problem is controlled. Pertinent negatives include no chest pain, headaches, palpitations or shortness of breath. There are no associated agents to hypertension. Risk factors for coronary artery disease include dyslipidemia, diabetes mellitus, family history, male gender and sedentary lifestyle. Past treatments include calcium channel blockers and angiotensin blockers. The current treatment provides mild improvement. There are no compliance problems.  Hypertensive end-organ damage includes kidney disease and retinopathy. Identifiable causes of hypertension include chronic renal disease and a thyroid problem.  ?Thyroid Problem ?Presents for follow-up (Abnormality noted with recent thyroid  studies.  No prior work-up done, no previous labs to compare to.) visit. Onset time: Unknown. Patient reports no anxiety, cold intolerance, constipation, diarrhea, fatigue, heat intolerance, palpitations, tre

## 2021-05-13 ENCOUNTER — Other Ambulatory Visit: Payer: Self-pay | Admitting: Family Medicine

## 2021-05-13 ENCOUNTER — Other Ambulatory Visit: Payer: Medicare Other

## 2021-05-13 DIAGNOSIS — Z125 Encounter for screening for malignant neoplasm of prostate: Secondary | ICD-10-CM

## 2021-05-13 DIAGNOSIS — Z8042 Family history of malignant neoplasm of prostate: Secondary | ICD-10-CM

## 2021-05-14 LAB — PSA: Prostate Specific Ag, Serum: 3.9 ng/mL (ref 0.0–4.0)

## 2021-05-21 ENCOUNTER — Encounter: Payer: Self-pay | Admitting: Family Medicine

## 2021-05-21 DIAGNOSIS — Z794 Long term (current) use of insulin: Secondary | ICD-10-CM | POA: Diagnosis not present

## 2021-05-21 DIAGNOSIS — E119 Type 2 diabetes mellitus without complications: Secondary | ICD-10-CM | POA: Diagnosis not present

## 2021-05-22 DIAGNOSIS — H35033 Hypertensive retinopathy, bilateral: Secondary | ICD-10-CM | POA: Diagnosis not present

## 2021-05-22 DIAGNOSIS — H43823 Vitreomacular adhesion, bilateral: Secondary | ICD-10-CM | POA: Diagnosis not present

## 2021-05-22 DIAGNOSIS — H2513 Age-related nuclear cataract, bilateral: Secondary | ICD-10-CM | POA: Diagnosis not present

## 2021-05-22 DIAGNOSIS — E113313 Type 2 diabetes mellitus with moderate nonproliferative diabetic retinopathy with macular edema, bilateral: Secondary | ICD-10-CM | POA: Diagnosis not present

## 2021-05-29 DIAGNOSIS — E113313 Type 2 diabetes mellitus with moderate nonproliferative diabetic retinopathy with macular edema, bilateral: Secondary | ICD-10-CM | POA: Diagnosis not present

## 2021-05-30 ENCOUNTER — Ambulatory Visit (INDEPENDENT_AMBULATORY_CARE_PROVIDER_SITE_OTHER): Payer: Medicare Other | Admitting: Nurse Practitioner

## 2021-05-30 DIAGNOSIS — A084 Viral intestinal infection, unspecified: Secondary | ICD-10-CM | POA: Diagnosis not present

## 2021-05-30 MED ORDER — ONDANSETRON HCL 4 MG PO TABS: 4.0000 mg | ORAL_TABLET | Freq: Three times a day (TID) | ORAL | 0 refills | Status: DC | PRN

## 2021-05-30 NOTE — Progress Notes (Signed)
? ?  Virtual Visit  Note ?Due to COVID-19 pandemic this visit was conducted virtually. This visit type was conducted due to national recommendations for restrictions regarding the COVID-19 Pandemic (e.g. social distancing, sheltering in place) in an effort to limit this patient's exposure and mitigate transmission in our community. All issues noted in this document were discussed and addressed.  A physical exam was not performed with this format. ? ?I connected with Anthony French on 05/30/21 at 2:11 by telephone and verified that I am speaking with the correct person using two identifiers. Anthony French is currently located at home and girlfriend Anthony French  is currently with him during visit. The provider, Mary-Margaret Hassell Done, FNP is located in their office at time of visit. ? ?I discussed the limitations, risks, security and privacy concerns of performing an evaluation and management service by telephone and the availability of in person appointments. I also discussed with the patient that there may be a patient responsible charge related to this service. The patient expressed understanding and agreed to proceed. ? ? ?History and Present Illness: ? ?Patient does visit c/o diarrhea for about 3 days that has gotten worse. He has been going 6-7 times a day. Was worse last night. Has not been on any new meds or antibiotics. He has taken imodium 1x and that did not help. Voiding 2-3 x a day ? ? ? ?Review of Systems  ?Constitutional:  Negative for chills and fever.  ?Gastrointestinal:  Positive for diarrhea, nausea and vomiting (not since yesterday). Negative for abdominal pain.  ? ? ?Observations/Objective: ?Alert and oriented- answers all questions appropriately ?No distress ?No abdominal pain ? ?Assessment and Plan: ?Anthony French in today with chief complaint of Diarrhea ? ? ?1. Viral gastroenteritis ?First 24 Hours-Clear liquids ? popsicles ? Jello ? gatorade ? Sprite ?Second 24 hours-Add Full liquids ( Liquids you cant see  through) ?Third 24 hours- Bland diet ( foods that are baked or broiled) ? *avoiding fried foods and highly spiced foods* ?During these 3 days ? Avoid milk, cheese, ice cream or any other dairy products ? Avoid caffeine- REMEMBER Mt. Dew and Mello Yellow contain lots of caffeine ?You should eat and drink in  Frequent small volumes ?If no improvement in symptoms or worsen in 2-3 days should RETRUN TO OFFICE or go to ER! ?  ?Imodium AD OTC ?- ondansetron (ZOFRAN) 4 MG tablet; Take 1 tablet (4 mg total) by mouth every 8 (eight) hours as needed for nausea or vomiting.  Dispense: 20 tablet; Refill: 0 ? ? ? ?Follow Up Instructions: ?prn ? ?  ?I discussed the assessment and treatment plan with the patient. The patient was provided an opportunity to ask questions and all were answered. The patient agreed with the plan and demonstrated an understanding of the instructions. ?  ?The patient was advised to call back or seek an in-person evaluation if the symptoms worsen or if the condition fails to improve as anticipated. ? ?The above assessment and management plan was discussed with the patient. The patient verbalized understanding of and has agreed to the management plan. Patient is aware to call the clinic if symptoms persist or worsen. Patient is aware when to return to the clinic for a follow-up visit. Patient educated on when it is appropriate to go to the emergency department.  ? ?Time call ended:  2:22 ? ?I provided 12 minutes of  non face-to-face time during this encounter. ? ? ? ?Mary-Margaret Hassell Done, FNP ? ? ?

## 2021-05-30 NOTE — Patient Instructions (Signed)
Diarrhea, Adult ?Diarrhea is when you pass loose and watery poop (stool) often. Diarrhea can make you feel weak and cause you to lose water in your body (get dehydrated). Losing water in your body can cause you to: ?Feel tired and thirsty. ?Have a dry mouth. ?Go pee (urinate) less often. ?Diarrhea often lasts 2-3 days. However, it can last longer if it is a sign of something more serious. It is important to treat your diarrhea as told by your doctor. ?Follow these instructions at home: ?Eating and drinking ? ?  ? ?Follow these instructions as told by your doctor: ?Take an ORS (oral rehydration solution). This is a drink that helps you replace fluids and minerals your body lost. It is sold at pharmacies and stores. ?Drink plenty of fluids, such as: ?Water. ?Ice chips. ?Diluted fruit juice. ?Low-calorie sports drinks. ?Milk, if you want. ?Avoid drinking fluids that have a lot of sugar or caffeine in them. ?Eat bland, easy-to-digest foods in small amounts as you are able. These foods include: ?Bananas. ?Applesauce. ?Rice. ?Low-fat (lean) meats. ?Toast. ?Crackers. ?Avoid alcohol. ?Avoid spicy or fatty foods. ? ?Medicines ?Take over-the-counter and prescription medicines only as told by your doctor. ?If you were prescribed an antibiotic medicine, take it as told by your doctor. Do not stop using the antibiotic even if you start to feel better. ?General instructions ? ?Wash your hands often using soap and water. If soap and water are not available, use a hand sanitizer. Others in your home should wash their hands as well. Hands should be washed: ?After using the toilet or changing a diaper. ?Before preparing, cooking, or serving food. ?While caring for a sick person. ?While visiting someone in a hospital. ?Drink enough fluid to keep your pee (urine) pale yellow. ?Rest at home while you get better. ?Take a warm bath to help with any burning or pain from having diarrhea. ?Watch your condition for any changes. ?Keep all  follow-up visits as told by your doctor. This is important. ?Contact a doctor if: ?You have a fever. ?Your diarrhea gets worse. ?You have new symptoms. ?You cannot keep fluids down. ?You feel light-headed or dizzy. ?You have a headache. ?You have muscle cramps. ?Get help right away if: ?You have chest pain. ?You feel very weak or you pass out (faint). ?You have bloody or black poop or poop that looks like tar. ?You have very bad pain, cramping, or bloating in your belly (abdomen). ?You have trouble breathing or you are breathing very quickly. ?Your heart is beating very quickly. ?Your skin feels cold and clammy. ?You feel confused. ?You have signs of losing too much water in your body, such as: ?Dark pee, very little pee, or no pee. ?Cracked lips. ?Dry mouth. ?Sunken eyes. ?Sleepiness. ?Weakness. ?Summary ?Diarrhea is when you pass loose and watery poop (stool) often. ?Diarrhea can make you feel weak and cause you to lose water in your body (get dehydrated). ?Take an ORS (oral rehydration solution). This is a drink that is sold at pharmacies and stores. ?Eat bland, easy-to-digest foods in small amounts as you are able. ?Contact a doctor if your condition gets worse. Get help right away if you have signs that you have lost too much water in your body. ?This information is not intended to replace advice given to you by your health care provider. Make sure you discuss any questions you have with your health care provider. ?Document Revised: 08/08/2020 Document Reviewed: 08/08/2020 ?Elsevier Patient Education ? 2023 [employer] ? ?

## 2021-06-03 ENCOUNTER — Other Ambulatory Visit: Payer: Medicare Other

## 2021-06-03 DIAGNOSIS — N189 Chronic kidney disease, unspecified: Secondary | ICD-10-CM | POA: Diagnosis not present

## 2021-06-03 DIAGNOSIS — I129 Hypertensive chronic kidney disease with stage 1 through stage 4 chronic kidney disease, or unspecified chronic kidney disease: Secondary | ICD-10-CM | POA: Diagnosis not present

## 2021-06-03 DIAGNOSIS — E1122 Type 2 diabetes mellitus with diabetic chronic kidney disease: Secondary | ICD-10-CM | POA: Diagnosis not present

## 2021-06-03 DIAGNOSIS — E1129 Type 2 diabetes mellitus with other diabetic kidney complication: Secondary | ICD-10-CM | POA: Diagnosis not present

## 2021-06-03 DIAGNOSIS — R809 Proteinuria, unspecified: Secondary | ICD-10-CM | POA: Diagnosis not present

## 2021-06-05 ENCOUNTER — Other Ambulatory Visit: Payer: Self-pay | Admitting: Family Medicine

## 2021-06-05 DIAGNOSIS — E1122 Type 2 diabetes mellitus with diabetic chronic kidney disease: Secondary | ICD-10-CM | POA: Diagnosis not present

## 2021-06-05 DIAGNOSIS — N170 Acute kidney failure with tubular necrosis: Secondary | ICD-10-CM | POA: Diagnosis not present

## 2021-06-05 DIAGNOSIS — E8722 Chronic metabolic acidosis: Secondary | ICD-10-CM | POA: Diagnosis not present

## 2021-06-05 DIAGNOSIS — N19 Unspecified kidney failure: Secondary | ICD-10-CM | POA: Diagnosis not present

## 2021-06-05 DIAGNOSIS — E1129 Type 2 diabetes mellitus with other diabetic kidney complication: Secondary | ICD-10-CM | POA: Diagnosis not present

## 2021-06-05 DIAGNOSIS — R809 Proteinuria, unspecified: Secondary | ICD-10-CM | POA: Diagnosis not present

## 2021-06-05 DIAGNOSIS — I129 Hypertensive chronic kidney disease with stage 1 through stage 4 chronic kidney disease, or unspecified chronic kidney disease: Secondary | ICD-10-CM | POA: Diagnosis not present

## 2021-06-05 DIAGNOSIS — N189 Chronic kidney disease, unspecified: Secondary | ICD-10-CM | POA: Diagnosis not present

## 2021-06-05 DIAGNOSIS — E875 Hyperkalemia: Secondary | ICD-10-CM | POA: Diagnosis not present

## 2021-06-13 DIAGNOSIS — E119 Type 2 diabetes mellitus without complications: Secondary | ICD-10-CM | POA: Diagnosis not present

## 2021-06-13 DIAGNOSIS — Z794 Long term (current) use of insulin: Secondary | ICD-10-CM | POA: Diagnosis not present

## 2021-06-14 ENCOUNTER — Other Ambulatory Visit: Payer: Self-pay | Admitting: Nurse Practitioner

## 2021-06-14 ENCOUNTER — Other Ambulatory Visit: Payer: Self-pay | Admitting: Family Medicine

## 2021-06-14 DIAGNOSIS — I10 Essential (primary) hypertension: Secondary | ICD-10-CM

## 2021-06-21 ENCOUNTER — Other Ambulatory Visit: Payer: Self-pay | Admitting: Family Medicine

## 2021-06-24 ENCOUNTER — Other Ambulatory Visit: Payer: Medicare Other

## 2021-06-24 DIAGNOSIS — N189 Chronic kidney disease, unspecified: Secondary | ICD-10-CM | POA: Diagnosis not present

## 2021-06-24 DIAGNOSIS — I129 Hypertensive chronic kidney disease with stage 1 through stage 4 chronic kidney disease, or unspecified chronic kidney disease: Secondary | ICD-10-CM | POA: Diagnosis not present

## 2021-06-24 DIAGNOSIS — E875 Hyperkalemia: Secondary | ICD-10-CM | POA: Diagnosis not present

## 2021-06-24 DIAGNOSIS — N170 Acute kidney failure with tubular necrosis: Secondary | ICD-10-CM | POA: Diagnosis not present

## 2021-06-24 DIAGNOSIS — E1122 Type 2 diabetes mellitus with diabetic chronic kidney disease: Secondary | ICD-10-CM | POA: Diagnosis not present

## 2021-06-26 DIAGNOSIS — E113313 Type 2 diabetes mellitus with moderate nonproliferative diabetic retinopathy with macular edema, bilateral: Secondary | ICD-10-CM | POA: Diagnosis not present

## 2021-07-03 ENCOUNTER — Encounter: Payer: Self-pay | Admitting: Podiatry

## 2021-07-03 ENCOUNTER — Ambulatory Visit (INDEPENDENT_AMBULATORY_CARE_PROVIDER_SITE_OTHER): Payer: Medicare Other | Admitting: Podiatry

## 2021-07-03 DIAGNOSIS — N1832 Chronic kidney disease, stage 3b: Secondary | ICD-10-CM | POA: Diagnosis not present

## 2021-07-03 DIAGNOSIS — M79675 Pain in left toe(s): Secondary | ICD-10-CM

## 2021-07-03 DIAGNOSIS — E1122 Type 2 diabetes mellitus with diabetic chronic kidney disease: Secondary | ICD-10-CM

## 2021-07-03 DIAGNOSIS — M79674 Pain in right toe(s): Secondary | ICD-10-CM | POA: Diagnosis not present

## 2021-07-03 DIAGNOSIS — Z794 Long term (current) use of insulin: Secondary | ICD-10-CM

## 2021-07-03 DIAGNOSIS — B351 Tinea unguium: Secondary | ICD-10-CM

## 2021-07-08 NOTE — Progress Notes (Signed)
  Subjective:  Patient ID: Anthony French, male    DOB: 1955-11-27,  MRN: [id number]  Anthony French presents to clinic today for at risk foot care. Pt has h/o NIDDM with chronic kidney disease and thick, elongated toenails left lower extremity which are tender when wearing enclosed shoe gear.  Patient states blood glucose was 205 mg/dl today.  Last known HgA1c was 6.5%.  New problem(s): None.   PCP is Janora Norlander, DO , and last visit was March 12, 2021.  Allergies  Allergen Reactions   Other Hives    Pt states that he is allergic to an ABT but he can not remember what it is   Sulfa Antibiotics Hives    Review of Systems: Negative except as noted in the HPI.  Objective: No changes noted in today's physical examination. General: Patient is a pleasant 66 y.o. Caucasian male in NAD. AAO x 3.   Neurovascular Examination: Capillary refill time to digits immediate b/l. Palpable pedal pulses b/l LE. Pedal hair present. No pain with calf compression b/l. Lower extremity skin temperature gradient within normal limits. No edema noted b/l LE. No cyanosis or clubbing noted b/l LE.  Protective sensation intact 5/5 intact bilaterally with 10g monofilament b/l. Vibratory sensation intact b/l.  Dermatological:  Pedal integument with normal turgor, texture and tone BLE. No open wounds b/l LE. No interdigital macerations noted b/l LE. Toenails L hallux elongated, discolored, dystrophic, thickened, and crumbly with subungual debris and tenderness to dorsal palpation. Nondystrophic toenails 2-5 bilaterally and R hallux.  Musculoskeletal:  Muscle strength 5/5 to all lower extremity muscle groups bilaterally. No pain, crepitus or joint limitation noted with ROM bilateral LE. No gross bony deformities bilaterally. Patient ambulates independent of any assistive aids.     Latest Ref Rng & Units 04/23/2021   10:55 AM 12/24/2020   11:13 AM 08/20/2020    2:15 PM  Hemoglobin A1C  Hemoglobin-A1c 0.0 -  7.0 % 6.0   6.5   6.1     Assessment/Plan: 1. Pain due to onychomycosis of toenails of both feet   2. Type 2 diabetes mellitus with stage 3b chronic kidney disease, with long-term current use of insulin ([place])     -Examined patient. -No new findings. No new orders. -Patient to continue soft, supportive shoe gear daily. -Mycotic toenails left great toe were debrided in length and girth with sterile nail nippers and dremel without iatrogenic bleeding. -Nondystrophic toenails trimmed 2-5 bilaterally and R hallux. -Patient/POA to call should there be question/concern in the interim.   Return in about 3 months (around 10/03/2021).  Marzetta Board, DPM

## 2021-07-14 DIAGNOSIS — E119 Type 2 diabetes mellitus without complications: Secondary | ICD-10-CM | POA: Diagnosis not present

## 2021-07-14 DIAGNOSIS — Z794 Long term (current) use of insulin: Secondary | ICD-10-CM | POA: Diagnosis not present

## 2021-08-07 DIAGNOSIS — E113313 Type 2 diabetes mellitus with moderate nonproliferative diabetic retinopathy with macular edema, bilateral: Secondary | ICD-10-CM | POA: Diagnosis not present

## 2021-08-15 ENCOUNTER — Other Ambulatory Visit: Payer: Medicare Other

## 2021-08-15 DIAGNOSIS — E782 Mixed hyperlipidemia: Secondary | ICD-10-CM | POA: Diagnosis not present

## 2021-08-15 DIAGNOSIS — Z794 Long term (current) use of insulin: Secondary | ICD-10-CM | POA: Diagnosis not present

## 2021-08-15 DIAGNOSIS — E1122 Type 2 diabetes mellitus with diabetic chronic kidney disease: Secondary | ICD-10-CM | POA: Diagnosis not present

## 2021-08-15 DIAGNOSIS — N184 Chronic kidney disease, stage 4 (severe): Secondary | ICD-10-CM | POA: Diagnosis not present

## 2021-08-15 DIAGNOSIS — E039 Hypothyroidism, unspecified: Secondary | ICD-10-CM | POA: Diagnosis not present

## 2021-08-16 LAB — COMPREHENSIVE METABOLIC PANEL
ALT: 13 IU/L (ref 0–44)
AST: 13 IU/L (ref 0–40)
Albumin/Globulin Ratio: 2.1 (ref 1.2–2.2)
Albumin: 4.1 g/dL (ref 3.8–4.8)
Alkaline Phosphatase: 82 IU/L (ref 44–121)
BUN/Creatinine Ratio: 19 (ref 10–24)
BUN: 62 mg/dL — ABNORMAL HIGH (ref 8–27)
Bilirubin Total: 0.3 mg/dL (ref 0.0–1.2)
CO2: 18 mmol/L — ABNORMAL LOW (ref 20–29)
Calcium: 8.9 mg/dL (ref 8.6–10.2)
Chloride: 114 mmol/L — ABNORMAL HIGH (ref 96–106)
Creatinine, Ser: 3.22 mg/dL — ABNORMAL HIGH (ref 0.76–1.27)
Globulin, Total: 2 g/dL (ref 1.5–4.5)
Glucose: 150 mg/dL — ABNORMAL HIGH (ref 70–99)
Potassium: 5.6 mmol/L — ABNORMAL HIGH (ref 3.5–5.2)
Sodium: 145 mmol/L — ABNORMAL HIGH (ref 134–144)
Total Protein: 6.1 g/dL (ref 6.0–8.5)
eGFR: 20 mL/min/{1.73_m2} — ABNORMAL LOW (ref >59)

## 2021-08-16 LAB — LIPID PANEL
Chol/HDL Ratio: 4.2 ratio (ref 0.0–5.0)
Cholesterol, Total: 146 mg/dL (ref 100–199)
HDL: 35 mg/dL — ABNORMAL LOW (ref >39)
LDL Chol Calc (NIH): 87 mg/dL (ref 0–99)
Triglycerides: 132 mg/dL (ref 0–149)
VLDL Cholesterol Cal: 24 mg/dL (ref 5–40)

## 2021-08-16 LAB — T4, FREE: Free T4: 0.95 ng/dL (ref 0.82–1.77)

## 2021-08-16 LAB — TSH: TSH: 5.04 u[IU]/mL — ABNORMAL HIGH (ref 0.450–4.500)

## 2021-08-22 NOTE — Patient Instructions (Signed)

## 2021-08-23 ENCOUNTER — Ambulatory Visit (INDEPENDENT_AMBULATORY_CARE_PROVIDER_SITE_OTHER): Payer: Medicare Other | Admitting: Nurse Practitioner

## 2021-08-23 ENCOUNTER — Encounter: Payer: Self-pay | Admitting: Nurse Practitioner

## 2021-08-23 VITALS — BP 151/80 | HR 58 | Ht 69.0 in | Wt 212.0 lb

## 2021-08-23 DIAGNOSIS — N184 Chronic kidney disease, stage 4 (severe): Secondary | ICD-10-CM | POA: Diagnosis not present

## 2021-08-23 DIAGNOSIS — E782 Mixed hyperlipidemia: Secondary | ICD-10-CM

## 2021-08-23 DIAGNOSIS — E1122 Type 2 diabetes mellitus with diabetic chronic kidney disease: Secondary | ICD-10-CM

## 2021-08-23 DIAGNOSIS — E039 Hypothyroidism, unspecified: Secondary | ICD-10-CM

## 2021-08-23 DIAGNOSIS — Z794 Long term (current) use of insulin: Secondary | ICD-10-CM | POA: Diagnosis not present

## 2021-08-23 DIAGNOSIS — E559 Vitamin D deficiency, unspecified: Secondary | ICD-10-CM | POA: Diagnosis not present

## 2021-08-23 DIAGNOSIS — I10 Essential (primary) hypertension: Secondary | ICD-10-CM

## 2021-08-23 LAB — POCT GLYCOSYLATED HEMOGLOBIN (HGB A1C): Hemoglobin A1C: 5.8 % — AB (ref 4.0–5.6)

## 2021-08-23 MED ORDER — LEVOTHYROXINE SODIUM 50 MCG PO TABS: 50.0000 ug | ORAL_TABLET | Freq: Every day | ORAL | 1 refills | Status: DC

## 2021-08-23 NOTE — Progress Notes (Signed)
08/23/2021, 10:22 AM  Endocrinology follow-up note   Subjective:    Patient ID: Anthony French, male    DOB: February 13, 1955.  Anthony French is being seen in follow-up after he was seen in consultation for management of currently uncontrolled symptomatic diabetes requested by  Janora Norlander, DO.   Past Medical History:  Diagnosis Date   Diabetes ([place])    Diabetic neuropathy ([place])    Essential hypertension, benign 06/01/2019   Hyperlipidemia     Past Surgical History:  Procedure Laterality Date   APPENDECTOMY  1980   CYST REMOVAL TRUNK      Social History   Socioeconomic History   Marital status: Single    Spouse name: Not on file   Number of children: 3   Years of education: Not on file   Highest education level: Not on file  Occupational History   Occupation: retired  Tobacco Use   Smoking status: Never   Smokeless tobacco: Never  Vaping Use   Vaping Use: Never used  Substance and Sexual Activity   Alcohol use: Not Currently   Drug use: Never   Sexual activity: Not Currently  Other Topics Concern   Not on file  Social History Narrative   Not on file   Social Determinants of Health   Financial Resource Strain: Not on file  Food Insecurity: Not on file  Transportation Needs: Not on file  Physical Activity: Not on file  Stress: Not on file  Social Connections: Not on file    Family History  Problem Relation Age of Onset   Stroke Mother    Diabetes Mother    Hypertension Mother    Hyperlipidemia Mother    Kidney disease Mother    COPD Father    Diabetes Sister    Stroke Brother    Diabetes Brother    Arthritis Maternal Grandmother     Outpatient Encounter Medications as of 08/23/2021  Medication Sig   amLODipine (NORVASC) 10 MG tablet TAKE 1 TABLET BY MOUTH DAILY   atorvastatin (LIPITOR) 40 MG tablet TAKE 1 TABLET BY MOUTH DAILY   blood glucose meter kit and supplies Dispense based on patient and insurance preference. Use  up to four times daily as directed. (FOR ICD-10 E10.9, E11.9). Check blood sugar twice a day as directed.   chlorthalidone (HYGROTON) 25 MG tablet Take 25 mg by mouth daily.   Cholecalciferol 25 MCG (1000 UT) capsule Take by mouth.   Continuous Blood Gluc Receiver (FREESTYLE LIBRE 2 READER) DEVI Use to test blood sugars up to 6 times daily. DX: E11.9   Continuous Blood Gluc Sensor (FREESTYLE LIBRE 2 SENSOR) MISC Use to test blood sugars up to 6 times daily. DX: E11.9   [employer] D3 25 MCG (1000 UT) capsule Take 1,000 Units by mouth daily.   Efinaconazole (JUBLIA) 10 % SOLN Apply 1 application topically daily.   gabapentin (NEURONTIN) 300 MG capsule TAKE 1 CAPSULE BY MOUTH IN THE  MORNING AND TAKE 3 CAPSULES BY  MOUTH IN THE EVENING   glucose blood (ACCU-CHEK GUIDE) test strip Test blood sugar 4 times per day, before meals and at bedtime.   hydrALAZINE (APRESOLINE) 50 MG tablet Take 50 mg by mouth 2 (two) times daily.   insulin NPH-regular Human (70-30) 100 UNIT/ML injection Inject 30 Units into the skin 2 (two) times daily before a meal.   Insulin Pen Needle (PEN NEEDLES) 31G X 8 MM MISC 1 each by Does not  apply route in the morning and at bedtime.   Insulin Syringe-Needle U-100 (BD INSULIN SYRINGE U/F) 31G X 5/16" 0.3 ML MISC USE WITH INSULIN TWICE  DAILY DX E11.22   levothyroxine (SYNTHROID) 50 MCG tablet Take 1 tablet (50 mcg total) by mouth daily before breakfast.   losartan (COZAAR) 50 MG tablet Take 50 mg by mouth daily.   ondansetron (ZOFRAN) 4 MG tablet Take 1 tablet (4 mg total) by mouth every 8 (eight) hours as needed for nausea or vomiting.   sodium bicarbonate 650 MG tablet Take 650 mg by mouth 2 (two) times daily.   sodium zirconium cyclosilicate (LOKELMA) 5 g packet Take 5 g by mouth 3 (three) times a week. Monday, Wednesday, and Friday   [DISCONTINUED] glipiZIDE (GLUCOTROL XL) 5 MG 24 hr tablet TAKE 1 TABLET BY MOUTH DAILY  WITH BREAKFAST   [DISCONTINUED] levothyroxine (SYNTHROID) 50 MCG  tablet TAKE 1 TABLET BY MOUTH DAILY  BEFORE BREAKFAST   [DISCONTINUED] losartan (COZAAR) 25 MG tablet Take 25 mg by mouth daily.   No facility-administered encounter medications on file as of 08/23/2021.    ALLERGIES: Allergies  Allergen Reactions   Other Hives    Pt states that he is allergic to an ABT but he can not remember what it is   Sulfa Antibiotics Hives    VACCINATION STATUS: Immunization History  Administered Date(s) Administered   Fluad Quad(high Dose 65+) 11/02/2019, 12/27/2020   Influenza Inj Mdck Quad With Preservative 11/26/2015   Influenza,inj,Quad PF,6+ Mos 12/25/2016   Pneumococcal Conjugate-13 12/27/2018   Pneumococcal Polysaccharide-23 04/16/2017   Tdap 08/19/2017   Zoster Recombinat (Shingrix) 07/28/2019, 09/07/2020    Diabetes He presents for his follow-up diabetic visit. He has type 2 diabetes mellitus. Onset time: He was diagnosed at approximate age of 66 years. His disease course has been stable. There are no hypoglycemic associated symptoms. Pertinent negatives for hypoglycemia include no headaches, nervousness/anxiousness, pallor or tremors. Pertinent negatives for diabetes include no chest pain, no fatigue, no polydipsia, no polyphagia, no polyuria and no weight loss. There are no hypoglycemic complications. Symptoms are stable. Diabetic complications include nephropathy, peripheral neuropathy and retinopathy. Risk factors for coronary artery disease include dyslipidemia, diabetes mellitus, hypertension, male sex, sedentary lifestyle and family history. Current diabetic treatment includes insulin injections and oral agent (monotherapy). He is compliant with treatment most of the time. His weight is fluctuating minimally. He is following a generally healthy diet. When asked about meal planning, he reported none. He has had a previous visit with a dietitian. He never participates in exercise. His home blood glucose trend is fluctuating minimally. His overall blood  glucose range is 140-180 mg/dl. (He presents today with his CGM and logs showing at target glycemic profile overall with some significant drops causing him to grab snacks more often.  His POCT A1c today is 5.8%, stable from last visit of 6%.  He denies any significant hypoglycemia.  Analysis of his CGM shows TIR 55%, TAR 44%, TBR 1% with a GMI of 7.6%.) An ACE inhibitor/angiotensin II receptor blocker is being taken. He does not see a podiatrist.Eye exam is current.  Hyperlipidemia This is a chronic problem. The current episode started more than 1 year ago. The problem is controlled. Recent lipid tests were reviewed and are normal. Exacerbating diseases include chronic renal disease, diabetes and hypothyroidism. There are no known factors aggravating his hyperlipidemia. Pertinent negatives include no chest pain, myalgias or shortness of breath. Current antihyperlipidemic treatment includes statins. The current treatment provides  significant improvement of lipids. There are no compliance problems.  Risk factors for coronary artery disease include diabetes mellitus, dyslipidemia, hypertension, male sex and a sedentary lifestyle.  Hypertension This is a chronic problem. The current episode started more than 1 year ago. The problem has been gradually improving since onset. The problem is controlled. Pertinent negatives include no chest pain, headaches, palpitations or shortness of breath. There are no associated agents to hypertension. Risk factors for coronary artery disease include dyslipidemia, diabetes mellitus, family history, male gender and sedentary lifestyle. Past treatments include calcium channel blockers and angiotensin blockers. The current treatment provides mild improvement. There are no compliance problems.  Hypertensive end-organ damage includes kidney disease and retinopathy. Identifiable causes of hypertension include chronic renal disease and a thyroid problem.  Thyroid Problem Presents for  follow-up (Abnormality noted with recent thyroid studies.  No prior work-up done, no previous labs to compare to.) visit. Onset time: Unknown. Patient reports no anxiety, cold intolerance, constipation, diarrhea, fatigue, heat intolerance, palpitations, tremors, weight gain or weight loss. The symptoms have been stable. Past treatments include nothing. His past medical history is significant for diabetes and hyperlipidemia. There are no known risk factors.    Review of systems  Constitutional: + Minimally fluctuating body weight,  current Body mass index is 31.31 kg/m. , no fatigue, no subjective hyperthermia, no subjective hypothermia Eyes: no blurry vision, no xerophthalmia ENT: no sore throat, no nodules palpated in throat, no dysphagia/odynophagia, no hoarseness Cardiovascular: no chest pain, no shortness of breath, no palpitations, no leg swelling Respiratory: no cough, no shortness of breath Gastrointestinal: no nausea/vomiting/diarrhea Musculoskeletal: no muscle/joint aches Skin: no rashes, no hyperemia Neurological: no tremors, no numbness, no tingling, no dizziness Psychiatric: no depression, no anxiety   Objective:    BP (!) 151/80   Pulse (!) 58   Ht 5' 9"  (1.753 m)   Wt 212 lb (96.2 kg)   BMI 31.31 kg/m   Wt Readings from Last 3 Encounters:  08/23/21 212 lb (96.2 kg)  04/23/21 219 lb (99.3 kg)  03/12/21 216 lb 12.8 oz (98.3 kg)     BP Readings from Last 3 Encounters:  08/23/21 (!) 151/80  04/23/21 (!) 153/69  03/12/21 139/81      Physical Exam- Limited  Constitutional:  Body mass index is 31.31 kg/m. , not in acute distress, normal state of mind Eyes:  EOMI, no exophthalmos Neck: Supple Cardiovascular: RRR, no murmurs, rubs, or gallops, no edema Respiratory: Adequate breathing efforts, no crackles, rales, rhonchi, or wheezing Musculoskeletal: no gross deformities, strength intact in all four extremities, no gross restriction of joint movements Skin:  no  rashes, no hyperemia Neurological: no tremor with outstretched hands    CMP ( most recent) CMP     Component Value Date/Time   NA 145 (H) 08/15/2021 1018   K 5.6 (H) 08/15/2021 1018   CL 114 (H) 08/15/2021 1018   CO2 18 (L) 08/15/2021 1018   GLUCOSE 150 (H) 08/15/2021 1018   GLUCOSE 232 (H) 09/19/2020 1412   BUN 62 (H) 08/15/2021 1018   CREATININE 3.22 (H) 08/15/2021 1018   CALCIUM 8.9 08/15/2021 1018   PROT 6.1 08/15/2021 1018   ALBUMIN 4.1 08/15/2021 1018   AST 13 08/15/2021 1018   ALT 13 08/15/2021 1018   ALKPHOS 82 08/15/2021 1018   BILITOT 0.3 08/15/2021 1018   GFRNONAA 33 (L) 09/19/2020 1412   GFRAA 47 (L) 09/13/2019 1420    Diabetic Labs (most recent): Lab Results  Component Value  Date   HGBA1C 6.0 04/23/2021   HGBA1C 6.5 12/24/2020   HGBA1C 6.1 08/20/2020   MICROALBUR 150 08/20/2020   MICROALBUR 237.3 09/13/2019     Lipid Panel ( most recent) Lipid Panel     Component Value Date/Time   CHOL 146 08/15/2021 1018   TRIG 132 08/15/2021 1018   HDL 35 (L) 08/15/2021 1018   CHOLHDL 4.2 08/15/2021 1018   LDLCALC 87 08/15/2021 1018   LABVLDL 24 08/15/2021 1018     Assessment & Plan:   1) Type 2 diabetes mellitus with stage 3b chronic kidney disease, with long-term current use of insulin (HCC)  - Anthony French has currently uncontrolled symptomatic type 2 DM since  66 years of age.   Recent labs reviewed.  He presents today with his CGM and logs showing at target glycemic profile overall with some significant drops causing him to grab snacks more often.  His POCT A1c today is 5.8%, stable from last visit of 6%.  He denies any significant hypoglycemia.  Analysis of his CGM shows TIR 55%, TAR 44%, TBR 1% with a GMI of 7.6%.  - I had a long discussion with him about the progressive nature of diabetes and the pathology behind its complications. -his diabetes is complicated by CKD, peripheral neuropathy and he remains at a high risk for more acute and chronic  complications which include CAD, CVA, CKD, retinopathy, and neuropathy. These are all discussed in detail with him.  - Nutritional counseling repeated at each appointment due to patients tendency to fall back in to old habits.  - The patient admits there is a room for improvement in their diet and drink choices. -  Suggestion is made for the patient to avoid simple carbohydrates from their diet including Cakes, Sweet Desserts / Pastries, Ice Cream, Soda (diet and regular), Sweet Tea, Candies, Chips, Cookies, Sweet Pastries, Store Bought Juices, Alcohol in Excess of 1-2 drinks a day, Artificial Sweeteners, Coffee Creamer, and "Sugar-free" Products. This will help patient to have stable blood glucose profile and potentially avoid unintended weight gain.   - I encouraged the patient to switch to unprocessed or minimally processed complex starch and increased protein intake (animal or plant source), fruits, and vegetables.   - Patient is advised to stick to a routine mealtimes to eat 3 meals a day and avoid unnecessary snacks (to snack only to correct hypoglycemia).  - I have approached him with the following individualized plan to manage  his diabetes and patient agrees:   - he will continue to benefit from simplicity of premixed insulin.   -Based on his tightening glycemic profile, will discontinue his Glipizide for now which increases his risk of hypoglycemia with advanced CKD.  He can continue his Novolin 70/30 at 30 units BID with meals if glucose is above 90 and he is eating.   -He is encouraged to continue using his CGM to monitor blood glucose 4 times per day, before meals and before bed, and call the clinic if he has readings less than 70 or greater than 300 for 3 tests in a row.  - Specific targets for  A1c;  LDL, HDL,  and Triglycerides were discussed with the patient.  2) Blood Pressure /Hypertension:  His blood pressure is not controlled to target, he had not long taken his medication  prior to the visit today.  He is advised to continue Cozaar 25 mg po daily as well as Amlodipine 5 mg po daily as needed.  3) Lipids/Hyperlipidemia:  His most recent lipid panel from 08/15/21 shows controlled LDL of 87.  He is advised to continue Atorvastatin 40 mg po daily at bedtime.  Side effects and precautions discussed with him.    4)  Weight/Diet:  His Body mass index is 31.31 kg/m.     he is a candidate for some weight loss. I discussed with him the fact that loss of 5 - 10% of his  current body weight will have the most impact on his diabetes management.  Exercise, and detailed carbohydrates information provided  -  detailed on discharge instructions.  5)Hypothyroidism-acquired:  His previsit thyroid function tests are consistent with slight under-replacement but he reports he had a period of time where he ran out waiting on the pharmacy to send refill.  He is advised to continue his Levothyroxine 50 mcg po daily before breakfast.    - The correct intake of thyroid hormone (Levothyroxine, Synthroid), is on empty stomach first thing in the morning, with water, separated by at least 30 minutes from breakfast and other medications,  and separated by more than 4 hours from calcium, iron, multivitamins, acid reflux medications (PPIs).  - This medication is a life-long medication and will be needed to correct thyroid hormone imbalances for the rest of your life.  The dose may change from time to time, based on thyroid blood work.  - It is extremely important to be consistent taking this medication, near the same time each morning.  -AVOID TAKING PRODUCTS CONTAINING BIOTIN (commonly found in Hair, Skin, Nails vitamins) AS IT INTERFERES WITH THE VALIDITY OF THYROID FUNCTION BLOOD TESTS.  6) Chronic Care/Health Maintenance: -he is on ARB and Statin medications and  is encouraged to initiate and continue to follow up with Ophthalmology, Dentist,  Podiatrist at least yearly or according to  recommendations, and advised to stay away from smoking. I have recommended yearly flu vaccine and pneumonia vaccine at least every 5 years; moderate intensity exercise for up to 150 minutes weekly; and  sleep for at least 7 hours a day.  - he is advised to maintain close follow up with Janora Norlander, DO for primary care needs, as well as his other providers for optimal and coordinated care.     I spent 30 minutes in the care of the patient today including review of labs from [facility], Lipids, Thyroid Function, Hematology (current and previous including abstractions from other facilities); face-to-face time discussing  his blood glucose readings/logs, discussing hypoglycemia and hyperglycemia episodes and symptoms, medications doses, his options of short and long term treatment based on the latest standards of care / guidelines;  discussion about incorporating lifestyle medicine;  and documenting the encounter. Risk reduction counseling performed per USPSTF guidelines to reduce obesity and cardiovascular risk factors.     Please refer to Patient Instructions for Blood Glucose Monitoring and Insulin/Medications Dosing Guide"  in media tab for additional information. Please  also refer to " Patient Self Inventory" in the Media  tab for reviewed elements of pertinent patient history.  Anthony French participated in the discussions, expressed understanding, and voiced agreement with the above plans.  All questions were answered to his satisfaction. he is encouraged to contact clinic should he have any questions or concerns prior to his return visit.   Follow up plan: - Return in about 3 months (around 11/23/2021) for Diabetes F/U with A1c in office, No previsit labs, Bring meter and logs.  Rayetta Pigg, FNP-BC [facility] [address] [place],  [place] [address] Phone: [phone] Fax: [phone]   08/23/2021, 10:22 AM

## 2021-08-29 ENCOUNTER — Other Ambulatory Visit: Payer: Self-pay | Admitting: Family Medicine

## 2021-09-09 ENCOUNTER — Ambulatory Visit (INDEPENDENT_AMBULATORY_CARE_PROVIDER_SITE_OTHER): Payer: Medicare Other | Admitting: Family Medicine

## 2021-09-09 VITALS — BP 124/55 | HR 55 | Temp 97.2°F | Ht 69.0 in | Wt 211.0 lb

## 2021-09-09 DIAGNOSIS — H9311 Tinnitus, right ear: Secondary | ICD-10-CM

## 2021-09-09 DIAGNOSIS — H6121 Impacted cerumen, right ear: Secondary | ICD-10-CM | POA: Diagnosis not present

## 2021-09-09 DIAGNOSIS — E1122 Type 2 diabetes mellitus with diabetic chronic kidney disease: Secondary | ICD-10-CM

## 2021-09-09 DIAGNOSIS — L819 Disorder of pigmentation, unspecified: Secondary | ICD-10-CM | POA: Diagnosis not present

## 2021-09-09 DIAGNOSIS — Z794 Long term (current) use of insulin: Secondary | ICD-10-CM

## 2021-09-09 DIAGNOSIS — N1832 Chronic kidney disease, stage 3b: Secondary | ICD-10-CM | POA: Diagnosis not present

## 2021-09-09 DIAGNOSIS — H9191 Unspecified hearing loss, right ear: Secondary | ICD-10-CM

## 2021-09-09 DIAGNOSIS — E119 Type 2 diabetes mellitus without complications: Secondary | ICD-10-CM | POA: Diagnosis not present

## 2021-09-09 NOTE — Patient Instructions (Signed)
Worried that you may have a basal cell on your shoulder.  I've placed a referral to Dr Nevada Crane in [place] cell carcinoma is the most common form of skin cancer. It begins in the basal cells, which are at the bottom of the outer skin layer (epidermis). Basal cell carcinoma can often be cured. It rarely spreads to other areas of the body (metastasizes). It may come back at the same location (recur), but it can be treated again if this happens. Basal cell carcinoma occurs most often on parts of the body that are frequently exposed to the sun, such as: Parts of the head, including the scalp or face. Ears. Neck. Arms or legs. Backs of the hands. What are the causes? This condition is usually caused by exposure to ultraviolet (UV) light. UV light may come from the sun or from tanning beds. Other causes include: Exposure to arsenic, a highly poisonous metal. Exposure to high-energy X-rays (radiation). Exposure to toxic tars and oils. Certain genetic conditions, such as a condition that makes a person sensitive to sunlight. What increases the risk? You are more likely to develop this condition if: You are older than 68 years of age. You have: Fair skin (light complexion), blond or red hair, or blue, green, or gray eye color. Childhood freckling. Had repeated sunburns or sun exposure over long periods of time, especially during childhood. A weakened body defense system (immune system). Been exposed to certain chemicals, such as tar, soot, and arsenic. Chronic inflammatory conditions or infections. A family or personal history of basal cell carcinoma. You use tanning beds. What are the signs or symptoms? The main symptom of this condition is a growth or lesion on the skin. The shape and color of the growth or lesion may vary. The main types include: An open sore that may remain open for three weeks or longer. The sore may bleed or crust. This type of lesion can be an  early sign of basal cell carcinoma. Basal cell carcinoma often shows up as a sore that does not heal. A reddish area that may crust, itch, or cause discomfort. This may occur on areas that are exposed to the sun. These patches might be easier to feel than to see. A shiny or clear bump that is red, white, or pink. In people who have dark hair, the bump is often tan, black, or brown. These bumps can look like moles. A pink growth with a raised border. The growth will have a crusted and indented area in the center. Small blood vessels may appear on the surface of the growth as it gets bigger. A scar-like area that looks like shiny, stretched skin. The area may be white, yellow, or waxy. It often has irregular borders. This may be a sign of more aggressive basal cell carcinoma. How is this diagnosed? This condition may be diagnosed with: A physical exam. Removal of a tissue sample to be examined under a microscope (biopsy). How is this treated? Treatment for this condition involves removing the cancerous tissue. The method that is used for this depends on the type, size, location, and number of tumors. Possible treatments include: Surgery, such as: Mohs surgery. In this procedure, the cancerous skin cells are removed layer by layer until all of the tumor has been removed. Surgical removal (excision) of the tumor. This involves removing the entire tumor and a small amount of normal skin that surrounds it. Cryosurgery. This involves freezing the tumor with liquid nitrogen.  Plastic surgery. The tumor is removed, and healthy skin from another part of the body is used to cover the wound. This may be done for large tumors that are in areas where it is not possible to stretch the nearby skin to sew the edges of the wound together. Therapies or treatments, such as: Radiation. This may be used for tumors on the face. Photodynamic therapy. A chemical cream is applied to the skin, and light exposure is used to  activate the chemical. Electrodesiccation and curettage. This involves alternately scraping and burning the tumor while using an electric current to control bleeding. Chemical treatments, such as imiquimod cream and interferon injections. These may be used to remove superficial tumors with minimal scarring. Follow these instructions at home: Avoid direct exposure to the sun. Do self-exams as told by your health care provider. Look for new spots or changes in your skin. Keep all follow-up visits. This is important. How is this prevented?  Avoid the sun when it is the strongest. This is usually between 10 a.m. and 4 p.m. When you are out in the sun, use a sunscreen that has a sun protection factor (SPF) of at least 64. Apply sunscreen at least 30 minutes before exposure to the sun. Reapply sunscreen every 2-4 hours while you are outside. Also reapply it after swimming and after excessive sweating. Always wear hats, protective clothing, and UV-blocking sunglasses when you are outdoors. Do not use tanning beds. Contact a health care provider if: You notice any new spots or any changes in your skin. You have had a basal cell carcinoma tumor removed, and you notice a new growth in the same location. Get help right away if: You have a spot that is sore and does not heal. You have a spot that bleeds easily. Summary Basal cell carcinoma is the most common form of skin cancer. It begins in the bottom of the outer skin layer (epidermis). Basal cell carcinoma can almost always be cured. This condition is usually caused by exposure to ultraviolet (UV) light. It mostly affects the face, scalp, neck, ears, arms, legs, or backs of the hands. The main symptom of this condition is a growth or lesion on the skin that can vary in shape and color. You can prevent this cancer by avoiding direct exposure to the sun, applying sunscreen of at least 30 SPF, and wearing protective clothing. Apply sunscreen 30 minutes  before you go out into the sun, and reapply every 2-4 hours while you are outside. This information is not intended to replace advice given to you by your health care provider. Make sure you discuss any questions you have with your health care provider. Document Revised: 05/31/2020 Document Reviewed: 05/31/2020 Elsevier Patient Education  [employer].

## 2021-09-09 NOTE — Progress Notes (Signed)
Subjective: CC: Ear fullness PCP: [name], DO [id number]:ILXFV Ben is a 66 y.o. male presenting to clinic today for:  1.  Ear fullness Patient reports popping on the right ear, muffled hearing and some tinnitus.  Denies any drainage from the ear.  No falls.  No fevers.  2.  Skin lesions The skin lesion along the anterior chest that we used cryoablation on the has recurred.  He has a lesion on his right back that seems to be getting bigger as well.  He notes the one on his back has been bleeding spontaneously.  ROS: Per HPI  Allergies  Allergen Reactions   Other Hives    Pt states that he is allergic to an ABT but he can not remember what it is   Sulfa Antibiotics Hives   Past Medical History:  Diagnosis Date   Diabetes (HCC)    Diabetic neuropathy (HCC)    Essential hypertension, benign 06/01/2019   Hyperlipidemia     Current Outpatient Medications:    amLODipine (NORVASC) 10 MG tablet, TAKE 1 TABLET BY MOUTH DAILY, Disp: 90 tablet, Rfl: 3   atorvastatin (LIPITOR) 40 MG tablet, TAKE 1 TABLET BY MOUTH DAILY, Disp: 100 tablet, Rfl: 0   blood glucose meter kit and supplies, Dispense based on patient and insurance preference. Use up to four times daily as directed. (FOR ICD-10 E10.9, E11.9). Check blood sugar twice a day as directed., Disp: 1 each, Rfl: 0   chlorthalidone (HYGROTON) 25 MG tablet, Take 25 mg by mouth daily., Disp: , Rfl:    Cholecalciferol 25 MCG (1000 UT) capsule, Take by mouth., Disp: , Rfl:    Continuous Blood Gluc Receiver (FREESTYLE LIBRE 2 READER) DEVI, Use to test blood sugars up to 6 times daily. DX: E11.9, Disp: 1 each, Rfl: 0   Continuous Blood Gluc Sensor (FREESTYLE LIBRE 2 SENSOR) MISC, Use to test blood sugars up to 6 times daily. DX: E11.9, Disp: 6 each, Rfl: 2   [employer] D3 25 MCG (1000 UT) capsule, Take 1,000 Units by mouth daily., Disp: , Rfl:    Efinaconazole (JUBLIA) 10 % SOLN, Apply 1 application topically daily., Disp: 4 mL, Rfl: 5    gabapentin (NEURONTIN) 300 MG capsule, TAKE 1 CAPSULE BY MOUTH IN THE  MORNING AND TAKE 3 CAPSULES BY  MOUTH IN THE EVENING, Disp: 400 capsule, Rfl: 0   glucose blood (ACCU-CHEK GUIDE) test strip, Test blood sugar 4 times per day, before meals and at bedtime., Disp: 500 strip, Rfl: 3   hydrALAZINE (APRESOLINE) 50 MG tablet, Take 50 mg by mouth 2 (two) times daily., Disp: , Rfl:    insulin NPH-regular Human (70-30) 100 UNIT/ML injection, Inject 30 Units into the skin 2 (two) times daily before a meal., Disp: , Rfl:    Insulin Pen Needle (PEN NEEDLES) 31G X 8 MM MISC, 1 each by Does not apply route in the morning and at bedtime., Disp: 100 each, Rfl: 11   Insulin Syringe-Needle U-100 (BD INSULIN SYRINGE U/F) 31G X 5/16" 0.3 ML MISC, USE WITH INSULIN TWICE  DAILY DX E11.22, Disp: 200 each, Rfl: 3   levothyroxine (SYNTHROID) 50 MCG tablet, Take 1 tablet (50 mcg total) by mouth daily before breakfast., Disp: 90 tablet, Rfl: 1   losartan (COZAAR) 50 MG tablet, Take 50 mg by mouth daily., Disp: , Rfl:    ondansetron (ZOFRAN) 4 MG tablet, Take 1 tablet (4 mg total) by mouth every 8 (eight) hours as needed for nausea or  vomiting., Disp: 20 tablet, Rfl: 0   sodium bicarbonate 650 MG tablet, Take 650 mg by mouth 2 (two) times daily., Disp: , Rfl:    sodium zirconium cyclosilicate (LOKELMA) 5 g packet, Take 5 g by mouth 3 (three) times a week. Monday, Wednesday, and Friday, Disp: , Rfl:  Social History   Socioeconomic History   Marital status: Single    Spouse name: Not on file   Number of children: 3   Years of education: Not on file   Highest education level: Not on file  Occupational History   Occupation: retired  Tobacco Use   Smoking status: Never   Smokeless tobacco: Never  Vaping Use   Vaping Use: Never used  Substance and Sexual Activity   Alcohol use: Not Currently   Drug use: Never   Sexual activity: Not Currently  Other Topics Concern   Not on file  Social History Narrative   Not on  file   Social Determinants of Health   Financial Resource Strain: Not on file  Food Insecurity: Not on file  Transportation Needs: Not on file  Physical Activity: Not on file  Stress: Not on file  Social Connections: Not on file  Intimate Partner Violence: Not on file   Family History  Problem Relation Age of Onset   Stroke Mother    Diabetes Mother    Hypertension Mother    Hyperlipidemia Mother    Kidney disease Mother    COPD Father    Diabetes Sister    Stroke Brother    Diabetes Brother    Arthritis Maternal Grandmother     Objective: Office vital signs reviewed. BP (!) 124/55   Pulse (!) 55   Temp (!) 97.2 F (36.2 C)   Ht $R'5\' 9"'pW$  (1.753 m)   Wt 211 lb (95.7 kg)   SpO2 97%   BMI 31.16 kg/m   Physical Examination:  General: Awake, alert, well nourished, No acute distress HEENT: No lymphadenopathy.  Right TM is obscured by cerumen.  Quite a bit of hair in bilateral ear canals Skin: Pearlescent, smooth and hyperemic lesion noted on the right shoulder.  No central umbilication appreciated.  Right medial collarbone with a flat patch of hyperemia that has associated scaling of the skin  Assessment/ Plan: 66 y.o. male   Unilateral hearing loss, right  Tinnitus aurium, right  Impacted cerumen of right ear  Type 2 diabetes mellitus with stage 3b chronic kidney disease, with long-term current use of insulin (HCC)  Pigmented skin lesion of uncertain behavior of torso - Plan: Ambulatory referral to Dermatology  Unable to completely evacuate cerumen in the right ear canal, discussed need for cutting down the hairs in this ear in efforts to completely clean it out.  He is going to work on that and return in 2 weeks for repeat irrigation of right ear.  If his tinnitus and muffled hearing does not improve after evacuation of the cerumen, low threshold to refer to audiology for further evaluation and management  Sugar continues to be under excellent control and managed by  endocrinology.  Glipizide recently discontinued for fear of hypoglycemia.  Agree with this change  The lesion of concern on the shoulder is concerning for basal cell carcinoma given its pearlescent look.  May have a squamous cell carcinoma developing on the chest given refractory nature to cryoablation.  Referral to dermatology has been placed  No orders of the defined types were placed in this encounter.  No orders of  the defined types were placed in this encounter.    Janora Norlander, DO [place] [phone]

## 2021-09-18 ENCOUNTER — Other Ambulatory Visit: Payer: Self-pay | Admitting: Family Medicine

## 2021-09-23 ENCOUNTER — Encounter: Payer: Self-pay | Admitting: Family Medicine

## 2021-09-23 ENCOUNTER — Ambulatory Visit: Payer: Medicare Other | Admitting: Family Medicine

## 2021-10-02 DIAGNOSIS — E113313 Type 2 diabetes mellitus with moderate nonproliferative diabetic retinopathy with macular edema, bilateral: Secondary | ICD-10-CM | POA: Diagnosis not present

## 2021-10-02 DIAGNOSIS — H35033 Hypertensive retinopathy, bilateral: Secondary | ICD-10-CM | POA: Diagnosis not present

## 2021-10-02 DIAGNOSIS — H2513 Age-related nuclear cataract, bilateral: Secondary | ICD-10-CM | POA: Diagnosis not present

## 2021-10-02 DIAGNOSIS — H43823 Vitreomacular adhesion, bilateral: Secondary | ICD-10-CM | POA: Diagnosis not present

## 2021-10-07 ENCOUNTER — Encounter: Payer: Self-pay | Admitting: Podiatrist

## 2021-10-07 ENCOUNTER — Ambulatory Visit (INDEPENDENT_AMBULATORY_CARE_PROVIDER_SITE_OTHER): Payer: Medicare Other | Admitting: Podiatrist

## 2021-10-07 DIAGNOSIS — M79609 Pain in unspecified limb: Secondary | ICD-10-CM

## 2021-10-07 DIAGNOSIS — E1142 Type 2 diabetes mellitus with diabetic polyneuropathy: Secondary | ICD-10-CM

## 2021-10-07 DIAGNOSIS — B351 Tinea unguium: Secondary | ICD-10-CM | POA: Diagnosis not present

## 2021-10-07 NOTE — Progress Notes (Signed)
Subjective:  Patient ID: Anthony French, male    DOB: 22-Aug-1955,  MRN: [id number]   Arlyn Buerkle presents to clinic today for at risk foot care. Pt has h/o NIDDM with chronic kidney disease and thick, elongated toenails left lower extremity which are tender when wearing enclosed shoe gear.   Patient states blood glucose was 158 mg/dl today.  Last known HgA1c was 5.8%.   New problem(s): None.    PCP is Janora Norlander, DO        Allergies  Allergen Reactions   Other Hives      Pt states that he is allergic to an ABT but he can not remember what it is   Sulfa Antibiotics Hives      Review of Systems: Negative except as noted in the HPI.   Objective: No changes noted in today's physical examination. General: Patient is a pleasant 66 y.o. Caucasian male in NAD. AAO x 3.    Neurovascular Examination: Capillary refill time to digits immediate b/l. Palpable pedal pulses b/l LE. Pedal hair present. No pain with calf compression b/l. Lower extremity skin temperature gradient within normal limits. No edema noted b/l LE. No cyanosis or clubbing noted b/l LE.   Protective sensation intact 5/5 intact bilaterally with 10g monofilament b/l. Vibratory sensation intact b/l.   Dermatological:  Pedal integument with normal turgor, texture and tone BLE. No open wounds b/l LE. No interdigital macerations noted b/l LE. Toenails L hallux elongated, discolored, dystrophic, thickened, and crumbly with subungual debris and tenderness to dorsal palpation. Nondystrophic toenails 2-5 bilaterally and R hallux.   Musculoskeletal:  Muscle strength 5/5 to all lower extremity muscle groups bilaterally. No pain, crepitus or joint limitation noted with ROM bilateral LE. No gross bony deformities bilaterally. Patient ambulates independent of any assistive aids.       Latest Ref Rng & Units 04/23/2021   10:55 AM 12/24/2020   11:13 AM 08/20/2020    2:15 PM  Hemoglobin A1C  Hemoglobin-A1c 0.0 - 7.0 % 6.0   6.5   6.1       Assessment/Plan: 1. Pain due to onychomycosis of toenails of both feet   2. Type 2 diabetes mellitus with stage 3b chronic kidney disease, with long-term current use of insulin ([place])     -Examined patient. -Patient to continue soft, supportive shoe gear daily. -Mycotic toenails left great toe were debrided in length and girth with sterile nail nippers and dremel without iatrogenic bleeding. -Nondystrophic toenails trimmed 2-5 bilaterally and R hallux. -Patient/POA to call should there be question/concern in the interim.    Return in 3 months for continued at risk foot care.

## 2021-10-09 DIAGNOSIS — Z794 Long term (current) use of insulin: Secondary | ICD-10-CM | POA: Diagnosis not present

## 2021-10-09 DIAGNOSIS — E119 Type 2 diabetes mellitus without complications: Secondary | ICD-10-CM | POA: Diagnosis not present

## 2021-10-17 DIAGNOSIS — L820 Inflamed seborrheic keratosis: Secondary | ICD-10-CM | POA: Diagnosis not present

## 2021-10-17 DIAGNOSIS — C44612 Basal cell carcinoma of skin of right upper limb, including shoulder: Secondary | ICD-10-CM | POA: Diagnosis not present

## 2021-10-28 ENCOUNTER — Encounter: Payer: Self-pay | Admitting: Family Medicine

## 2021-10-28 DIAGNOSIS — C4491 Basal cell carcinoma of skin, unspecified: Secondary | ICD-10-CM | POA: Insufficient documentation

## 2021-11-04 DIAGNOSIS — Z794 Long term (current) use of insulin: Secondary | ICD-10-CM | POA: Diagnosis not present

## 2021-11-04 DIAGNOSIS — E119 Type 2 diabetes mellitus without complications: Secondary | ICD-10-CM | POA: Diagnosis not present

## 2021-11-11 DIAGNOSIS — E113293 Type 2 diabetes mellitus with mild nonproliferative diabetic retinopathy without macular edema, bilateral: Secondary | ICD-10-CM | POA: Diagnosis not present

## 2021-11-11 DIAGNOSIS — Z7984 Long term (current) use of oral hypoglycemic drugs: Secondary | ICD-10-CM | POA: Diagnosis not present

## 2021-11-11 DIAGNOSIS — H2513 Age-related nuclear cataract, bilateral: Secondary | ICD-10-CM | POA: Diagnosis not present

## 2021-11-11 LAB — HM DIABETES EYE EXAM

## 2021-11-20 ENCOUNTER — Other Ambulatory Visit: Payer: Medicare Other

## 2021-11-20 DIAGNOSIS — N189 Chronic kidney disease, unspecified: Secondary | ICD-10-CM | POA: Diagnosis not present

## 2021-11-20 DIAGNOSIS — E875 Hyperkalemia: Secondary | ICD-10-CM | POA: Diagnosis not present

## 2021-11-20 DIAGNOSIS — E1122 Type 2 diabetes mellitus with diabetic chronic kidney disease: Secondary | ICD-10-CM | POA: Diagnosis not present

## 2021-11-20 DIAGNOSIS — R809 Proteinuria, unspecified: Secondary | ICD-10-CM | POA: Diagnosis not present

## 2021-11-20 DIAGNOSIS — N170 Acute kidney failure with tubular necrosis: Secondary | ICD-10-CM | POA: Diagnosis not present

## 2021-11-21 DIAGNOSIS — Z08 Encounter for follow-up examination after completed treatment for malignant neoplasm: Secondary | ICD-10-CM | POA: Diagnosis not present

## 2021-11-21 DIAGNOSIS — Z85828 Personal history of other malignant neoplasm of skin: Secondary | ICD-10-CM | POA: Diagnosis not present

## 2021-11-25 ENCOUNTER — Ambulatory Visit: Payer: Medicare Other | Admitting: Nurse Practitioner

## 2021-11-28 DIAGNOSIS — E1122 Type 2 diabetes mellitus with diabetic chronic kidney disease: Secondary | ICD-10-CM | POA: Diagnosis not present

## 2021-11-28 DIAGNOSIS — E875 Hyperkalemia: Secondary | ICD-10-CM | POA: Diagnosis not present

## 2021-11-28 DIAGNOSIS — N189 Chronic kidney disease, unspecified: Secondary | ICD-10-CM | POA: Diagnosis not present

## 2021-11-28 DIAGNOSIS — R809 Proteinuria, unspecified: Secondary | ICD-10-CM | POA: Diagnosis not present

## 2021-11-28 DIAGNOSIS — E1129 Type 2 diabetes mellitus with other diabetic kidney complication: Secondary | ICD-10-CM | POA: Diagnosis not present

## 2021-11-28 DIAGNOSIS — I129 Hypertensive chronic kidney disease with stage 1 through stage 4 chronic kidney disease, or unspecified chronic kidney disease: Secondary | ICD-10-CM | POA: Diagnosis not present

## 2021-11-28 DIAGNOSIS — N170 Acute kidney failure with tubular necrosis: Secondary | ICD-10-CM | POA: Diagnosis not present

## 2021-11-29 ENCOUNTER — Ambulatory Visit (INDEPENDENT_AMBULATORY_CARE_PROVIDER_SITE_OTHER): Payer: Medicare Other | Admitting: Nurse Practitioner

## 2021-11-29 ENCOUNTER — Encounter: Payer: Self-pay | Admitting: Nurse Practitioner

## 2021-11-29 VITALS — BP 192/82 | HR 58 | Ht 69.0 in | Wt 211.8 lb

## 2021-11-29 DIAGNOSIS — N184 Chronic kidney disease, stage 4 (severe): Secondary | ICD-10-CM

## 2021-11-29 DIAGNOSIS — E1122 Type 2 diabetes mellitus with diabetic chronic kidney disease: Secondary | ICD-10-CM

## 2021-11-29 DIAGNOSIS — E039 Hypothyroidism, unspecified: Secondary | ICD-10-CM

## 2021-11-29 DIAGNOSIS — Z794 Long term (current) use of insulin: Secondary | ICD-10-CM

## 2021-11-29 DIAGNOSIS — I10 Essential (primary) hypertension: Secondary | ICD-10-CM | POA: Diagnosis not present

## 2021-11-29 DIAGNOSIS — E559 Vitamin D deficiency, unspecified: Secondary | ICD-10-CM

## 2021-11-29 DIAGNOSIS — E782 Mixed hyperlipidemia: Secondary | ICD-10-CM | POA: Diagnosis not present

## 2021-11-29 LAB — POCT GLYCOSYLATED HEMOGLOBIN (HGB A1C): Hemoglobin A1C: 6.3 % — AB (ref 4.0–5.6)

## 2021-11-29 NOTE — Progress Notes (Signed)
11/29/2021, 9:55 AM  Endocrinology follow-up note   Subjective:    Patient ID: Anthony French, male    DOB: [birth year].  Anthony French is being seen in follow-up after he was seen in consultation for management of currently uncontrolled symptomatic diabetes requested by  Janora Norlander, DO.   Past Medical History:  Diagnosis Date   Diabetes ([place])    Diabetic neuropathy ([place])    Essential hypertension, benign 06/01/2019   Hyperlipidemia     Past Surgical History:  Procedure Laterality Date   APPENDECTOMY  1980   CYST REMOVAL TRUNK      Social History   Socioeconomic History   Marital status: Single    Spouse name: Not on file   Number of children: 3   Years of education: Not on file   Highest education level: Not on file  Occupational History   Occupation: retired  Tobacco Use   Smoking status: Never   Smokeless tobacco: Never  Vaping Use   Vaping Use: Never used  Substance and Sexual Activity   Alcohol use: Not Currently   Drug use: Never   Sexual activity: Not Currently  Other Topics Concern   Not on file  Social History Narrative   Not on file   Social Determinants of Health   Financial Resource Strain: Not on file  Food Insecurity: Not on file  Transportation Needs: Not on file  Physical Activity: Not on file  Stress: Not on file  Social Connections: Not on file    Family History  Problem Relation Age of Onset   Stroke Mother    Diabetes Mother    Hypertension Mother    Hyperlipidemia Mother    Kidney disease Mother    COPD Father    Diabetes Sister    Stroke Brother    Diabetes Brother    Arthritis Maternal Grandmother     Outpatient Encounter Medications as of 11/29/2021  Medication Sig   amLODipine (NORVASC) 10 MG tablet TAKE 1 TABLET BY MOUTH DAILY   atorvastatin (LIPITOR) 40 MG tablet TAKE 1 TABLET BY MOUTH DAILY   blood glucose meter kit and supplies Dispense based on patient and insurance preference.  Use up to four times daily as directed. (FOR ICD-10 E10.9, E11.9). Check blood sugar twice a day as directed.   chlorthalidone (HYGROTON) 25 MG tablet Take 25 mg by mouth daily.   Cholecalciferol 25 MCG (1000 UT) capsule Take by mouth.   Continuous Blood Gluc Receiver (FREESTYLE LIBRE 2 READER) DEVI Use to test blood sugars up to 6 times daily. DX: E11.9   Continuous Blood Gluc Sensor (FREESTYLE LIBRE 2 SENSOR) MISC Use to test blood sugars up to 6 times daily. DX: E11.9   [employer] D3 25 MCG (1000 UT) capsule Take 1,000 Units by mouth daily.   Efinaconazole (JUBLIA) 10 % SOLN Apply 1 application topically daily.   gabapentin (NEURONTIN) 300 MG capsule TAKE 1 CAPSULE BY MOUTH IN THE  MORNING AND TAKE 3 CAPSULES BY  MOUTH IN THE EVENING   glucose blood (ACCU-CHEK GUIDE) test strip Test blood sugar 4 times per day, before meals and at bedtime.   hydrALAZINE (APRESOLINE) 50 MG tablet Take 50 mg by mouth 2 (two) times daily.   insulin NPH-regular Human (70-30) 100 UNIT/ML injection Inject 20 Units into the skin 2 (two) times daily before a meal.   Insulin Pen Needle (PEN NEEDLES) 31G X 8 MM MISC 1 each by Does not  apply route in the morning and at bedtime.   Insulin Syringe-Needle U-100 (BD INSULIN SYRINGE U/F) 31G X 5/16" 0.3 ML MISC USE WITH INSULIN TWICE  DAILY DX E11.22   levothyroxine (SYNTHROID) 50 MCG tablet Take 1 tablet (50 mcg total) by mouth daily before breakfast.   ondansetron (ZOFRAN) 4 MG tablet Take 1 tablet (4 mg total) by mouth every 8 (eight) hours as needed for nausea or vomiting.   sodium zirconium cyclosilicate (LOKELMA) 5 g packet Take 5 g by mouth 3 (three) times a week. Monday, Wednesday, and Friday   sodium bicarbonate 650 MG tablet Take 650 mg by mouth 2 (two) times daily.   [DISCONTINUED] losartan (COZAAR) 50 MG tablet Take 50 mg by mouth daily. (Patient not taking: Reported on 11/29/2021)   No facility-administered encounter medications on file as of 11/29/2021.     ALLERGIES: Allergies  Allergen Reactions   Other Hives    Pt states that he is allergic to an ABT but he can not remember what it is   Sulfa Antibiotics Hives    VACCINATION STATUS: Immunization History  Administered Date(s) Administered   Fluad Quad(high Dose 65+) 11/02/2019, 12/27/2020   Influenza Inj Mdck Quad With Preservative 11/26/2015   Influenza,inj,Quad PF,6+ Mos 12/25/2016   Pneumococcal Conjugate-13 12/27/2018   Pneumococcal Polysaccharide-23 04/16/2017   Tdap 08/19/2017   Zoster Recombinat (Shingrix) 07/28/2019, 09/07/2020    Diabetes He presents for his follow-up diabetic visit. He has type 2 diabetes mellitus. Onset time: He was diagnosed at approximate age of 15 years. His disease course has been improving. There are no hypoglycemic associated symptoms. Pertinent negatives for hypoglycemia include no headaches, nervousness/anxiousness, pallor or tremors. Pertinent negatives for diabetes include no chest pain, no fatigue, no polydipsia, no polyphagia, no polyuria and no weight loss. There are no hypoglycemic complications. Symptoms are stable. Diabetic complications include nephropathy, peripheral neuropathy and retinopathy. Risk factors for coronary artery disease include dyslipidemia, diabetes mellitus, hypertension, male sex, sedentary lifestyle and family history. Current diabetic treatment includes insulin injections. He is compliant with treatment most of the time. His weight is fluctuating minimally. He is following a generally healthy diet. When asked about meal planning, he reported none. He has had a previous visit with a dietitian. He never participates in exercise. His home blood glucose trend is fluctuating dramatically. His overall blood glucose range is 180-200 mg/dl. (He presents today with his CGM and logs showing fluctuating glycemic profile overall.  His POCT A1c today is 6.3%.  He does have drops in glucose where he tends to overcorrect causing a high  spike shortly after.  Analysis of his CGM shows TIR 45%, TAR 54%, TBR 1% with a GMI of 7.9%.  He denies any significant hypoglycemia, catches it early due to his CGM.) An ACE inhibitor/angiotensin II receptor blocker is being taken. He does not see a podiatrist.Eye exam is current.  Hyperlipidemia This is a chronic problem. The current episode started more than 1 year ago. The problem is controlled. Recent lipid tests were reviewed and are normal. Exacerbating diseases include chronic renal disease, diabetes and hypothyroidism. There are no known factors aggravating his hyperlipidemia. Pertinent negatives include no chest pain, myalgias or shortness of breath. Current antihyperlipidemic treatment includes statins. The current treatment provides significant improvement of lipids. There are no compliance problems.  Risk factors for coronary artery disease include diabetes mellitus, dyslipidemia, hypertension, male sex and a sedentary lifestyle.  Hypertension This is a chronic problem. The current episode started more than 1  year ago. The problem has been gradually improving since onset. The problem is controlled. Pertinent negatives include no chest pain, headaches, palpitations or shortness of breath. There are no associated agents to hypertension. Risk factors for coronary artery disease include dyslipidemia, diabetes mellitus, family history, male gender and sedentary lifestyle. Past treatments include calcium channel blockers and angiotensin blockers. The current treatment provides mild improvement. There are no compliance problems.  Hypertensive end-organ damage includes kidney disease and retinopathy. Identifiable causes of hypertension include chronic renal disease and a thyroid problem.  Thyroid Problem Presents for follow-up (Abnormality noted with recent thyroid studies.  No prior work-up done, no previous labs to compare to.) visit. Onset time: Unknown. Patient reports no anxiety, cold intolerance,  constipation, diarrhea, fatigue, heat intolerance, palpitations, tremors, weight gain or weight loss. The symptoms have been stable. Past treatments include nothing. His past medical history is significant for diabetes and hyperlipidemia. There are no known risk factors.   Review of systems  Constitutional: + Minimally fluctuating body weight,  current Body mass index is 31.28 kg/m. , no fatigue, no subjective hyperthermia, no subjective hypothermia Eyes: + blurry vision-being evaluated for cataract sx soon, no xerophthalmia ENT: no sore throat, no nodules palpated in throat, no dysphagia/odynophagia, no hoarseness Cardiovascular: no chest pain, no shortness of breath, no palpitations, no leg swelling Respiratory: no cough, no shortness of breath Gastrointestinal: no nausea/vomiting/diarrhea Musculoskeletal: no muscle/joint aches Skin: no rashes, no hyperemia Neurological: no tremors, no numbness, no tingling, no dizziness Psychiatric: no depression, no anxiety   Objective:    BP (!) 192/82 (BP Location: Right Arm, Patient Position: Sitting, Cuff Size: Large) Comment: Patient states that he has not taken his BP medicaion  Pulse (!) 58   Ht _0  (1.753 m)   Wt 211 lb 12.8 oz (96.1 kg)   BMI 31.28 kg/m   Wt Readings from Last 3 Encounters:  11/29/21 211 lb 12.8 oz (96.1 kg)  09/09/21 211 lb (95.7 kg)  08/23/21 212 lb (96.2 kg)     BP Readings from Last 3 Encounters:  11/29/21 (!) 192/82  09/09/21 (!) 124/55  08/23/21 (!) 151/80     Physical Exam- Limited  Constitutional:  Body mass index is 31.28 kg/m. , not in acute distress, normal state of mind Eyes:  EOMI, no exophthalmos Neck: Supple Cardiovascular: RRR, no murmurs, rubs, or gallops, no edema Respiratory: Adequate breathing efforts, no crackles, rales, rhonchi, or wheezing Musculoskeletal: no gross deformities, strength intact in all four extremities, no gross restriction of joint movements Skin:  no rashes, no  hyperemia Neurological: no tremor with outstretched hands    CMP ( most recent) CMP     Component Value Date/Time   NA 145 (H) 08/15/2021 1018   K 5.6 (H) 08/15/2021 1018   CL 114 (H) 08/15/2021 1018   CO2 18 (L) 08/15/2021 1018   GLUCOSE 150 (H) 08/15/2021 1018   GLUCOSE 232 (H) 09/19/2020 1412   BUN 62 (H) 08/15/2021 1018   CREATININE 3.22 (H) 08/15/2021 1018   CALCIUM 8.9 08/15/2021 1018   PROT 6.1 08/15/2021 1018   ALBUMIN 4.1 08/15/2021 1018   AST 13 08/15/2021 1018   ALT 13 08/15/2021 1018   ALKPHOS 82 08/15/2021 1018   BILITOT 0.3 08/15/2021 1018   GFRNONAA 33 (L) 09/19/2020 1412   GFRAA 47 (L) 09/13/2019 1420    Diabetic Labs (most recent): Lab Results  Component Value Date   HGBA1C 6.3 (A) 11/29/2021   HGBA1C 5.8 (A) 08/23/2021  HGBA1C 6.0 04/23/2021   MICROALBUR 150 08/20/2020   MICROALBUR 237.3 09/13/2019     Lipid Panel ( most recent) Lipid Panel     Component Value Date/Time   CHOL 146 08/15/2021 1018   TRIG 132 08/15/2021 1018   HDL 35 (L) 08/15/2021 1018   CHOLHDL 4.2 08/15/2021 1018   LDLCALC 87 08/15/2021 1018   LABVLDL 24 08/15/2021 1018     Assessment & Plan:   1) Type 2 diabetes mellitus with stage 3b chronic kidney disease, with long-term current use of insulin ([facility])  - Anthony French has currently uncontrolled symptomatic type 2 DM since  66 years of age.   Recent labs reviewed.  He presents today with his CGM and logs showing fluctuating glycemic profile overall.  His POCT A1c today is 6.3%.  He does have drops in glucose where he tends to overcorrect causing a high spike shortly after.  Analysis of his CGM shows TIR 45%, TAR 54%, TBR 1% with a GMI of 7.9%.  He denies any significant hypoglycemia, catches it early due to his CGM.  - I had a long discussion with him about the progressive nature of diabetes and the pathology behind its complications. -his diabetes is complicated by CKD, peripheral neuropathy and he remains at a high risk  for more acute and chronic complications which include CAD, CVA, CKD, retinopathy, and neuropathy. These are all discussed in detail with him.  - Nutritional counseling repeated at each appointment due to patients tendency to fall back in to old habits.  - The patient admits there is a room for improvement in their diet and drink choices. -  Suggestion is made for the patient to avoid simple carbohydrates from their diet including Cakes, Sweet Desserts / Pastries, Ice Cream, Soda (diet and regular), Sweet Tea, Candies, Chips, Cookies, Sweet Pastries, Store Bought Juices, Alcohol in Excess of 1-2 drinks a day, Artificial Sweeteners, Coffee Creamer, and "Sugar-free" Products. This will help patient to have stable blood glucose profile and potentially avoid unintended weight gain.   - I encouraged the patient to switch to unprocessed or minimally processed complex starch and increased protein intake (animal or plant source), fruits, and vegetables.   - Patient is advised to stick to a routine mealtimes to eat 3 meals a day and avoid unnecessary snacks (to snack only to correct hypoglycemia).  - I have approached him with the following individualized plan to manage  his diabetes and patient agrees:   - he will continue to benefit from simplicity of premixed insulin.   -Based on his tightening glycemic profile, he is advised to lower his Novolin 70/30 to 20 units BID with meals if glucose above 90 and he is eating.  -He is encouraged to continue using his CGM to monitor blood glucose 4 times per day, before meals and before bed, and call the clinic if he has readings less than 70 or greater than 300 for 3 tests in a row.  - Specific targets for  A1c;  LDL, HDL,  and Triglycerides were discussed with the patient.  2) Blood Pressure /Hypertension:  His blood pressure is not controlled to target, he had not long taken his medication prior to the visit today.  He is advised to continue Chlorthalidone 25  mg po daily as well as Amlodipine 10 mg po daily as needed.  3) Lipids/Hyperlipidemia:  His most recent lipid panel from 08/15/21 shows controlled LDL of 87.  He is advised to continue Atorvastatin 40 mg  po daily at bedtime.  Side effects and precautions discussed with him.    4)  Weight/Diet:  His Body mass index is 31.28 kg/m.     he is a candidate for some weight loss. I discussed with him the fact that loss of 5 - 10% of his  current body weight will have the most impact on his diabetes management.  Exercise, and detailed carbohydrates information provided  -  detailed on discharge instructions.  5)Hypothyroidism-acquired: There is no recent TFTs to review.  He is advised to continue his Levothyroxine 50 mcg po daily before breakfast.  Will recheck TFTs on subsequent visits.   - The correct intake of thyroid hormone (Levothyroxine, Synthroid), is on empty stomach first thing in the morning, with water, separated by at least 30 minutes from breakfast and other medications,  and separated by more than 4 hours from calcium, iron, multivitamins, acid reflux medications (PPIs).  - This medication is a life-long medication and will be needed to correct thyroid hormone imbalances for the rest of your life.  The dose may change from time to time, based on thyroid blood work.  - It is extremely important to be consistent taking this medication, near the same time each morning.  -AVOID TAKING PRODUCTS CONTAINING BIOTIN (commonly found in Hair, Skin, Nails vitamins) AS IT INTERFERES WITH THE VALIDITY OF THYROID FUNCTION BLOOD TESTS.  6) Chronic Care/Health Maintenance: -he is not on ARB (per nephrology) and is on Statin medications and is encouraged to initiate and continue to follow up with Ophthalmology, Dentist,  Podiatrist at least yearly or according to recommendations, and advised to stay away from smoking. I have recommended yearly flu vaccine and pneumonia vaccine at least every 5 years; moderate  intensity exercise for up to 150 minutes weekly; and  sleep for at least 7 hours a day.  - he is advised to maintain close follow up with Janora Norlander, DO for primary care needs, as well as his other providers for optimal and coordinated care.     I spent 30 minutes in the care of the patient today including review of labs from [place], Lipids, Thyroid Function, Hematology (current and previous including abstractions from other facilities); face-to-face time discussing  his blood glucose readings/logs, discussing hypoglycemia and hyperglycemia episodes and symptoms, medications doses, his options of short and long term treatment based on the latest standards of care / guidelines;  discussion about incorporating lifestyle medicine;  and documenting the encounter. Risk reduction counseling performed per USPSTF guidelines to reduce obesity and cardiovascular risk factors.     Please refer to Patient Instructions for Blood Glucose Monitoring and Insulin/Medications Dosing Guide"  in media tab for additional information. Please  also refer to " Patient Self Inventory" in the Media  tab for reviewed elements of pertinent patient history.  Anthony French participated in the discussions, expressed understanding, and voiced agreement with the above plans.  All questions were answered to his satisfaction. he is encouraged to contact clinic should he have any questions or concerns prior to his return visit.   Follow up plan: - Return in about 4 months (around 04/01/2022) for Diabetes F/U with A1c in office, No previsit labs, Bring meter and logs.  Rayetta Pigg, [facility] [address] [place], [place] [address] Phone: [phone] Fax: [phone]   11/29/2021, 9:55 AM

## 2021-12-11 DIAGNOSIS — E113313 Type 2 diabetes mellitus with moderate nonproliferative diabetic retinopathy with macular edema, bilateral: Secondary | ICD-10-CM | POA: Diagnosis not present

## 2021-12-27 DIAGNOSIS — Z794 Long term (current) use of insulin: Secondary | ICD-10-CM | POA: Diagnosis not present

## 2021-12-27 DIAGNOSIS — E119 Type 2 diabetes mellitus without complications: Secondary | ICD-10-CM | POA: Diagnosis not present

## 2022-01-01 ENCOUNTER — Other Ambulatory Visit: Payer: Self-pay | Admitting: Nurse Practitioner

## 2022-01-20 NOTE — Progress Notes (Signed)
[facility] Quality Team Note  Name: Anthony French Date of Birth: 14-May-1955 MRN: [id number] Date: 01/20/2022  [facility] Quality Team has reviewed this patient's chart, please see recommendations below:  [facility] Quality Other; (KED: Kidney Health Evaluation Gap- Patient needs Urine Albumin Creatinine Ratio Test completed for gap closure. EGFR has already been completed).

## 2022-01-21 ENCOUNTER — Ambulatory Visit (INDEPENDENT_AMBULATORY_CARE_PROVIDER_SITE_OTHER): Payer: Medicare Other | Admitting: Podiatry

## 2022-01-21 DIAGNOSIS — H25813 Combined forms of age-related cataract, bilateral: Secondary | ICD-10-CM | POA: Diagnosis not present

## 2022-01-21 DIAGNOSIS — Z91199 Patient's noncompliance with other medical treatment and regimen due to unspecified reason: Secondary | ICD-10-CM

## 2022-01-21 DIAGNOSIS — E113213 Type 2 diabetes mellitus with mild nonproliferative diabetic retinopathy with macular edema, bilateral: Secondary | ICD-10-CM | POA: Diagnosis not present

## 2022-01-21 NOTE — Progress Notes (Signed)
1. No-show for appointment     

## 2022-01-23 ENCOUNTER — Ambulatory Visit (INDEPENDENT_AMBULATORY_CARE_PROVIDER_SITE_OTHER): Payer: Medicare Other | Admitting: Podiatrist

## 2022-01-23 ENCOUNTER — Encounter: Payer: Self-pay | Admitting: Podiatrist

## 2022-01-23 DIAGNOSIS — M79609 Pain in unspecified limb: Secondary | ICD-10-CM

## 2022-01-23 DIAGNOSIS — B351 Tinea unguium: Secondary | ICD-10-CM | POA: Diagnosis not present

## 2022-01-23 NOTE — Progress Notes (Deleted)
  Subjective:  Patient ID: Anthony French, male    DOB: 11-18-1955,  MRN: [id number]   Anthony French presents to clinic today for at risk foot care. Pt has h/o NIDDM with chronic kidney disease and thick, elongated toenails bilateral lower extremity which are tender when wearing enclosed shoe gear.   Patient states blood glucose runs between 150-280 mg/dl.  Last known HgA1c was 6.3%. 11/29/21  New problem(s): None.    PCP is Janora Norlander, DO        Allergies  Allergen Reactions   Other Hives      Pt states that he is allergic to an ABT but he can not remember what it is   Sulfa Antibiotics Hives      Review of Systems: Negative except as noted in the HPI.   Objective: No changes noted in today's physical examination. General: Patient is a pleasant 67 y.o. Caucasian male in NAD. AAO x 3.    Neurovascular Examination: Capillary refill time to digits immediate b/l. Palpable pedal pulses b/l LE. Pedal hair present. No pain with calf compression b/l. Lower extremity skin temperature gradient within normal limits. No edema noted b/l LE. No cyanosis or clubbing noted b/l LE.   Protective sensation intact 5/5 intact bilaterally with 10g monofilament b/l. Vibratory sensation intact b/l.   Dermatological:  Pedal integument with normal turgor, texture and tone BLE. No open wounds b/l LE. No interdigital macerations noted b/l LE. Toenails L hallux elongated, discolored, dystrophic, thickened, and crumbly with subungual debris and tenderness to dorsal palpation. Nondystrophic toenails 2-5 bilaterally and R hallux.   Musculoskeletal:  Muscle strength 5/5 to all lower extremity muscle groups bilaterally. No pain, crepitus or joint limitation noted with ROM bilateral LE. No gross bony deformities bilaterally. Patient ambulates independent of any assistive aids.     Assessment/Plan: 1. Pain due to onychomycosis of toenails of both feet   2. Type 2 diabetes mellitus with stage 3b chronic kidney  disease, with long-term current use of insulin ([place])     -Examined patient. -Patient to continue soft, supportive shoe gear daily. -Mycotic toenails left great toe were debrided in length and girth with sterile nail nippers and dremel without iatrogenic bleeding. -Nondystrophic toenails trimmed 2-5 bilaterally and R hallux. -Patient/POA to call should there be question/concern in the interim.    Return in 3 months for continued at risk foot care.

## 2022-01-23 NOTE — Progress Notes (Signed)
Subjective: Anthony French is a 66 y.o. male patient with history of diabetes who presents to office today with a chief concern of long,mildly painful nails  while ambulating in shoes; unable to trim.  Patient denies any new cramping, numbness, burning or tingling in the legs. His last HgA1C was 6.3 on 11/29/21  Patient denies any new changes in medication or new problems.  Referring Provider: Janora Norlander, DO   Patient Active Problem List   Diagnosis Date Noted   Nodular basal cell carcinoma (BCC) 10/28/2021   Cough 03/14/2020   Nausea 03/14/2020   Sore throat 03/14/2020   Type 2 diabetes mellitus with stage 3b chronic kidney disease, with long-term current use of insulin ([place]) 06/01/2019   Mixed hyperlipidemia 06/01/2019   Essential hypertension, benign 06/01/2019   Thrombocytopenia ([place]) 05/11/2019   Diabetic retinopathy ([place]) 11/06/2017   Acute hypoxemic respiratory failure ([place]) 07/11/2017   Pneumonia of right lower lobe due to Haemophilus influenzae ([place]) 07/11/2017   Sepsis ([place]) 07/11/2017   CKD stage 3 due to type 2 diabetes mellitus ([place]) 06/17/2017   Actinic keratosis 04/16/2017   Diabetic polyneuropathy associated with type 2 diabetes mellitus ([place]) 03/13/2017   Foot callus 03/13/2017   Trigger middle finger of left hand 03/13/2017   Diabetes mellitus ([place]) 03/27/2015   Hyperlipidemia associated with type 2 diabetes mellitus ([place]) 03/25/2011   Dyslipidemia 03/25/2011   Current Outpatient Medications on File Prior to Visit  Medication Sig Dispense Refill   amLODipine (NORVASC) 10 MG tablet TAKE 1 TABLET BY MOUTH DAILY 90 tablet 3   atorvastatin (LIPITOR) 40 MG tablet TAKE 1 TABLET BY MOUTH DAILY 100 tablet 1   blood glucose meter kit and supplies Dispense based on patient and insurance preference. Use up to four times daily as directed. (FOR ICD-10 E10.9, E11.9). Check blood sugar twice a day as directed. 1 each 0   chlorthalidone (HYGROTON) 25 MG tablet Take 25 mg  by mouth daily.     Cholecalciferol 25 MCG (1000 UT) capsule Take by mouth.     Continuous Blood Gluc Receiver (FREESTYLE LIBRE 2 READER) DEVI Use to test blood sugars up to 6 times daily. DX: E11.9 1 each 0   Continuous Blood Gluc Sensor (FREESTYLE LIBRE 2 SENSOR) MISC Use to test blood sugars up to 6 times daily. DX: E11.9 6 each 2   [employer] D3 25 MCG (1000 UT) capsule Take 1,000 Units by mouth daily.     Efinaconazole (JUBLIA) 10 % SOLN Apply 1 application topically daily. 4 mL 5   gabapentin (NEURONTIN) 300 MG capsule TAKE 1 CAPSULE BY MOUTH IN THE  MORNING AND TAKE 3 CAPSULES BY  MOUTH IN THE EVENING 360 capsule 0   glucose blood (ACCU-CHEK GUIDE) test strip Test blood sugar 4 times per day, before meals and at bedtime. 500 strip 3   hydrALAZINE (APRESOLINE) 50 MG tablet Take 50 mg by mouth 2 (two) times daily.     insulin NPH-regular Human (70-30) 100 UNIT/ML injection Inject 20 Units into the skin 2 (two) times daily before a meal.     Insulin Pen Needle (PEN NEEDLES) 31G X 8 MM MISC 1 each by Does not apply route in the morning and at bedtime. 100 each 11   Insulin Syringe-Needle U-100 (BD INSULIN SYRINGE U/F) 31G X 5/16" 0.3 ML MISC USE WITH INSULIN TWICE  DAILY DX E11.22 200 each 3   levothyroxine (SYNTHROID) 50 MCG tablet TAKE 1 TABLET BY MOUTH DAILY  BEFORE BREAKFAST 100  tablet 2   ondansetron (ZOFRAN) 4 MG tablet Take 1 tablet (4 mg total) by mouth every 8 (eight) hours as needed for nausea or vomiting. 20 tablet 0   sodium bicarbonate 650 MG tablet Take 650 mg by mouth 2 (two) times daily.     sodium zirconium cyclosilicate (LOKELMA) 5 g packet Take 5 g by mouth 3 (three) times a week. Monday, Wednesday, and Friday     No current facility-administered medications on file prior to visit.   Allergies  Allergen Reactions   Other Hives    Pt states that he is allergic to an ABT but he can not remember what it is   Sulfa Antibiotics Hives      Objective: General: Patient is awake,  alert, and oriented x 3 and in no acute distress.  Integument: Skin is warm, dry and supple bilateral. Nails are tender, long, thickened and  dystrophic with subungual debris, consistent with onychomycosis 1 left. Remainder of nails are elongated. No signs of infection. No open lesions or preulcerative lesions present bilateral. Remaining integument unremarkable.  Vasculature:  Dorsalis Pedis pulse 2/4 bilateral. Posterior Tibial pulse  1/4 bilateral.  Capillary fill time <3 sec 1-5 bilateral. Temperature gradient within normal limits. no varicosities present bilateral. no edema present bilateral.   Neurology: The patient has intact sensation measured with a 5.07/10g Semmes Weinstein Monofilament at  5/5 pedal sites bilateral . Vibratory sensation diminished bilateral with tuning fork. No Babinski sign present bilateral.   Musculoskeletal: No symptomatic pedal deformities noted bilateral. Muscular strength 5/5 in all lower extremity muscular groups bilateral without pain on range of motion . No tenderness with calf compression bilateral.  Assessment and Plan:   ICD-10-CM   1. Pain due to onychomycosis of nail  B35.1    M79.609        -Examined patient. -Mechanically debrided all nails 1-5 bilateral using sterile nail nipper and filed with dremel without incident  -Answered all patient questions -Patient to return  in 3 months for at risk foot care -Patient advised to call the office if any problems or questions arise in the meantime.  Bronson Ing, DPM

## 2022-01-24 ENCOUNTER — Other Ambulatory Visit: Payer: Medicare Other

## 2022-01-24 DIAGNOSIS — R809 Proteinuria, unspecified: Secondary | ICD-10-CM | POA: Diagnosis not present

## 2022-01-24 DIAGNOSIS — N189 Chronic kidney disease, unspecified: Secondary | ICD-10-CM | POA: Diagnosis not present

## 2022-01-24 DIAGNOSIS — I129 Hypertensive chronic kidney disease with stage 1 through stage 4 chronic kidney disease, or unspecified chronic kidney disease: Secondary | ICD-10-CM | POA: Diagnosis not present

## 2022-01-24 DIAGNOSIS — E1122 Type 2 diabetes mellitus with diabetic chronic kidney disease: Secondary | ICD-10-CM | POA: Diagnosis not present

## 2022-01-24 DIAGNOSIS — N170 Acute kidney failure with tubular necrosis: Secondary | ICD-10-CM | POA: Diagnosis not present

## 2022-02-05 DIAGNOSIS — E113313 Type 2 diabetes mellitus with moderate nonproliferative diabetic retinopathy with macular edema, bilateral: Secondary | ICD-10-CM | POA: Diagnosis not present

## 2022-02-11 ENCOUNTER — Telehealth: Payer: Self-pay | Admitting: Family Medicine

## 2022-02-11 NOTE — Telephone Encounter (Signed)
Can use nasal saline spray/ flonase to decongest.  As long as he doesn't have glaucoma can also use Afrin nasal spray to try and open his sinuses up

## 2022-02-12 ENCOUNTER — Other Ambulatory Visit: Payer: Self-pay | Admitting: Nurse Practitioner

## 2022-02-12 ENCOUNTER — Ambulatory Visit (INDEPENDENT_AMBULATORY_CARE_PROVIDER_SITE_OTHER): Payer: Medicare Other | Admitting: Nurse Practitioner

## 2022-02-12 ENCOUNTER — Encounter: Payer: Self-pay | Admitting: Nurse Practitioner

## 2022-02-12 VITALS — BP 146/89 | HR 83 | Temp 98.7°F | Ht 69.0 in | Wt 203.0 lb

## 2022-02-12 DIAGNOSIS — R0989 Other specified symptoms and signs involving the circulatory and respiratory systems: Secondary | ICD-10-CM

## 2022-02-12 DIAGNOSIS — J069 Acute upper respiratory infection, unspecified: Secondary | ICD-10-CM | POA: Diagnosis not present

## 2022-02-12 DIAGNOSIS — R6889 Other general symptoms and signs: Secondary | ICD-10-CM | POA: Diagnosis not present

## 2022-02-12 LAB — VERITOR FLU A/B WAIVED
Influenza A: POSITIVE — AB
Influenza B: NEGATIVE

## 2022-02-12 MED ORDER — OSELTAMIVIR PHOSPHATE 75 MG PO CAPS: 75.0000 mg | ORAL_CAPSULE | Freq: Two times a day (BID) | ORAL | 0 refills | Status: DC

## 2022-02-12 MED ORDER — GUAIFENESIN ER 600 MG PO TB12: 600.0000 mg | ORAL_TABLET | Freq: Two times a day (BID) | ORAL | 0 refills | Status: DC

## 2022-02-12 MED ORDER — AMOXICILLIN-POT CLAVULANATE 875-125 MG PO TABS: 1.0000 | ORAL_TABLET | Freq: Two times a day (BID) | ORAL | 0 refills | Status: DC

## 2022-02-12 MED ORDER — OSELTAMIVIR PHOSPHATE 30 MG PO CAPS: 30.0000 mg | ORAL_CAPSULE | Freq: Two times a day (BID) | ORAL | 0 refills | Status: DC

## 2022-02-12 NOTE — Patient Instructions (Signed)

## 2022-02-12 NOTE — Progress Notes (Signed)
Acute Office Visit  Subjective:     Patient ID: Anthony French, male    DOB: November 12, 1955, 67 y.o.   MRN: [id number]  Chief Complaint  Patient presents with   Nasal Congestion   Headache   Ear Fullness    URI  This is a new problem. The current episode started yesterday. The problem has been unchanged. There has been no fever. Associated symptoms include congestion, coughing, headaches and a sore throat. He has tried acetaminophen for the symptoms. The treatment provided no relief.    Review of Systems  Constitutional:  Positive for chills, fever and malaise/fatigue.  HENT:  Positive for congestion and sore throat.   Respiratory:  Positive for cough.   Neurological:  Positive for headaches.  All other systems reviewed and are negative.       Objective:    BP (!) 146/89   Pulse 83   Temp 98.7 F (37.1 C)   Ht '5\' 9"'$  (1.753 m)   Wt 203 lb (92.1 kg)   SpO2 97%   BMI 29.98 kg/m    Physical Exam Vitals and nursing note reviewed.  Constitutional:      Appearance: He is well-developed.  HENT:     Head: Normocephalic.     Right Ear: External ear normal.     Left Ear: External ear normal.     Nose: Congestion present.  Eyes:     Extraocular Movements: Extraocular movements intact.  Cardiovascular:     Rate and Rhythm: Normal rate and regular rhythm.     Heart sounds: Normal heart sounds.  Pulmonary:     Effort: Pulmonary effort is normal.     Breath sounds: Normal breath sounds.  Abdominal:     General: Bowel sounds are normal.     Palpations: Abdomen is soft.  Skin:    General: Skin is warm.  Neurological:     Mental Status: He is alert.     Results for orders placed or performed in visit on 02/12/22  Veritor Flu A/B Waived  Result Value Ref Range   Influenza A Positive (A) Negative   Influenza B Negative Negative        Assessment & Plan:  Patient is positive for flu.  Started patient on Tamiflu 30 mg tablet by mouth twice daily for 5 days.  Patient  is given renal dose due to last GFR results with kidney functions less than 30. Education provided to patient, advised patient to increase, Tylenol as needed for fever and headache, hydration, follow-up with unresolved symptoms. Problem List Items Addressed This Visit   None Visit Diagnoses     Chest congestion    -  Primary   Relevant Orders   Veritor Flu A/B Waived (Completed)   Upper respiratory tract infection, unspecified type       Relevant Medications   guaiFENesin (MUCINEX) 600 MG 12 hr tablet   Other Relevant Orders   Veritor Flu A/B Waived (Completed)   Flu-like symptoms       Relevant Orders   Veritor Flu A/B Waived (Completed)       Meds ordered this encounter  Medications   guaiFENesin (MUCINEX) 600 MG 12 hr tablet    Sig: Take 1 tablet (600 mg total) by mouth 2 (two) times daily.    Dispense:  30 tablet    Refill:  0    Order Specific Question:   Supervising Provider    Answer:   Claretta Fraise [[id number]]   DISCONTD: amoxicillin-clavulanate (  AUGMENTIN) 875-125 MG tablet    Sig: Take 1 tablet by mouth 2 (two) times daily.    Dispense:  14 tablet    Refill:  0    Order Specific Question:   Supervising Provider    Answer:   Claretta Fraise [phone]   DISCONTD: oseltamivir (TAMIFLU) 75 MG capsule    Sig: Take 1 capsule (75 mg total) by mouth 2 (two) times daily.    Dispense:  10 capsule    Refill:  0    Order Specific Question:   Supervising Provider    Answer:   Claretta Fraise [[id number]]    Return if symptoms worsen or fail to improve.  Ivy Lynn, NP

## 2022-02-17 DIAGNOSIS — E119 Type 2 diabetes mellitus without complications: Secondary | ICD-10-CM | POA: Diagnosis not present

## 2022-02-17 DIAGNOSIS — Z794 Long term (current) use of insulin: Secondary | ICD-10-CM | POA: Diagnosis not present

## 2022-02-19 ENCOUNTER — Telehealth: Payer: Self-pay | Admitting: Family Medicine

## 2022-02-19 ENCOUNTER — Encounter: Payer: Medicare Other | Admitting: Family Medicine

## 2022-02-19 DIAGNOSIS — H25811 Combined forms of age-related cataract, right eye: Secondary | ICD-10-CM | POA: Diagnosis not present

## 2022-02-19 NOTE — Telephone Encounter (Signed)
Left message for patient to call back and schedule Medicare Annual Wellness Visit (AWV) to be completed by video or phone.   Last AWV:  01/17/2021   Please schedule at anytime with [place]     Any questions, please contact me at [phone]   Thank you,   [facility] Clinical Support for [place] [facility] ??[phone] or ??[phone]

## 2022-02-24 DIAGNOSIS — N189 Chronic kidney disease, unspecified: Secondary | ICD-10-CM | POA: Diagnosis not present

## 2022-02-24 DIAGNOSIS — E211 Secondary hyperparathyroidism, not elsewhere classified: Secondary | ICD-10-CM | POA: Diagnosis not present

## 2022-02-24 DIAGNOSIS — E8722 Chronic metabolic acidosis: Secondary | ICD-10-CM | POA: Diagnosis not present

## 2022-02-24 DIAGNOSIS — E1122 Type 2 diabetes mellitus with diabetic chronic kidney disease: Secondary | ICD-10-CM | POA: Diagnosis not present

## 2022-02-24 DIAGNOSIS — R809 Proteinuria, unspecified: Secondary | ICD-10-CM | POA: Diagnosis not present

## 2022-02-24 DIAGNOSIS — E1129 Type 2 diabetes mellitus with other diabetic kidney complication: Secondary | ICD-10-CM | POA: Diagnosis not present

## 2022-02-24 DIAGNOSIS — I129 Hypertensive chronic kidney disease with stage 1 through stage 4 chronic kidney disease, or unspecified chronic kidney disease: Secondary | ICD-10-CM | POA: Diagnosis not present

## 2022-02-24 DIAGNOSIS — D638 Anemia in other chronic diseases classified elsewhere: Secondary | ICD-10-CM | POA: Diagnosis not present

## 2022-02-24 DIAGNOSIS — E875 Hyperkalemia: Secondary | ICD-10-CM | POA: Diagnosis not present

## 2022-03-31 NOTE — Patient Instructions (Incomplete)

## 2022-04-01 ENCOUNTER — Ambulatory Visit: Payer: Medicare Other | Admitting: Nurse Practitioner

## 2022-04-01 DIAGNOSIS — E559 Vitamin D deficiency, unspecified: Secondary | ICD-10-CM

## 2022-04-01 DIAGNOSIS — E039 Hypothyroidism, unspecified: Secondary | ICD-10-CM

## 2022-04-01 DIAGNOSIS — N184 Chronic kidney disease, stage 4 (severe): Secondary | ICD-10-CM

## 2022-04-01 DIAGNOSIS — I10 Essential (primary) hypertension: Secondary | ICD-10-CM

## 2022-04-01 DIAGNOSIS — E782 Mixed hyperlipidemia: Secondary | ICD-10-CM

## 2022-04-02 DIAGNOSIS — E113313 Type 2 diabetes mellitus with moderate nonproliferative diabetic retinopathy with macular edema, bilateral: Secondary | ICD-10-CM | POA: Diagnosis not present

## 2022-04-02 DIAGNOSIS — H35033 Hypertensive retinopathy, bilateral: Secondary | ICD-10-CM | POA: Diagnosis not present

## 2022-04-02 DIAGNOSIS — H43823 Vitreomacular adhesion, bilateral: Secondary | ICD-10-CM | POA: Diagnosis not present

## 2022-04-02 DIAGNOSIS — H2513 Age-related nuclear cataract, bilateral: Secondary | ICD-10-CM | POA: Diagnosis not present

## 2022-04-03 ENCOUNTER — Ambulatory Visit: Payer: 59 | Admitting: Nurse Practitioner

## 2022-04-03 DIAGNOSIS — N184 Chronic kidney disease, stage 4 (severe): Secondary | ICD-10-CM

## 2022-04-03 DIAGNOSIS — I10 Essential (primary) hypertension: Secondary | ICD-10-CM

## 2022-04-03 DIAGNOSIS — E559 Vitamin D deficiency, unspecified: Secondary | ICD-10-CM

## 2022-04-03 DIAGNOSIS — E782 Mixed hyperlipidemia: Secondary | ICD-10-CM

## 2022-04-03 DIAGNOSIS — E039 Hypothyroidism, unspecified: Secondary | ICD-10-CM

## 2022-04-07 ENCOUNTER — Ambulatory Visit (INDEPENDENT_AMBULATORY_CARE_PROVIDER_SITE_OTHER): Payer: 59

## 2022-04-07 VITALS — Ht 69.0 in | Wt 219.0 lb

## 2022-04-07 DIAGNOSIS — Z0000 Encounter for general adult medical examination without abnormal findings: Secondary | ICD-10-CM | POA: Diagnosis not present

## 2022-04-07 NOTE — Progress Notes (Signed)
Subjective:   Anthony French is a 67 y.o. male who presents for Medicare Annual/Subsequent preventive examination. I connected with  Anthony French on 04/07/22 by a audio enabled telemedicine application and verified that I am speaking with the correct person using two identifiers.  Patient Location: Home  Provider Location: Home Office  I discussed the limitations of evaluation and management by telemedicine. The patient expressed understanding and agreed to proceed.  Review of Systems     Cardiac Risk Factors include: advanced age (>102mn, >>79women);male gender;hypertension;diabetes mellitus;dyslipidemia     Objective:    Today's Vitals   04/07/22 1344  Weight: 219 lb (99.3 kg)  Height: '5\' 9"'$  (1.753 m)   Body mass index is 32.34 kg/m.     04/07/2022    1:46 PM 01/17/2021   11:05 AM 09/01/2019    3:29 PM 05/26/2019    4:04 PM 05/11/2019   11:15 AM  Advanced Directives  Does Patient Have a Medical Advance Directive? No No No No No  Would patient like information on creating a medical advance directive? No - Patient declined No - Patient declined No - Patient declined No - Patient declined No - Patient declined    Current Medications (verified) Outpatient Encounter Medications as of 04/07/2022  Medication Sig   amLODipine (NORVASC) 10 MG tablet TAKE 1 TABLET BY MOUTH DAILY   atorvastatin (LIPITOR) 40 MG tablet TAKE 1 TABLET BY MOUTH DAILY   blood glucose meter kit and supplies Dispense based on patient and insurance preference. Use up to four times daily as directed. (FOR ICD-10 E10.9, E11.9). Check blood sugar twice a day as directed.   chlorthalidone (HYGROTON) 25 MG tablet Take 25 mg by mouth daily.   Continuous Blood Gluc Receiver (FREESTYLE LIBRE 2 READER) DEVI Use to test blood sugars up to 6 times daily. DX: E11.9   Continuous Blood Gluc Sensor (FREESTYLE LIBRE 2 SENSOR) MISC Use to test blood sugars up to 6 times daily. DX: E11.9   [employer] D3 25 MCG (1000 UT) capsule Take  1,000 Units by mouth daily.   Efinaconazole (JUBLIA) 10 % SOLN Apply 1 application topically daily.   gabapentin (NEURONTIN) 300 MG capsule TAKE 1 CAPSULE BY MOUTH IN THE  MORNING AND TAKE 3 CAPSULES BY  MOUTH IN THE EVENING   glucose blood (ACCU-CHEK GUIDE) test strip Test blood sugar 4 times per day, before meals and at bedtime.   guaiFENesin (MUCINEX) 600 MG 12 hr tablet Take 1 tablet (600 mg total) by mouth 2 (two) times daily.   hydrALAZINE (APRESOLINE) 50 MG tablet Take 50 mg by mouth 2 (two) times daily.   insulin NPH-regular Human (70-30) 100 UNIT/ML injection Inject 20 Units into the skin 2 (two) times daily before a meal.   Insulin Pen Needle (PEN NEEDLES) 31G X 8 MM MISC 1 each by Does not apply route in the morning and at bedtime.   Insulin Syringe-Needle U-100 (BD INSULIN SYRINGE U/F) 31G X 5/16" 0.3 ML MISC USE WITH INSULIN TWICE  DAILY DX E11.22   levothyroxine (SYNTHROID) 50 MCG tablet TAKE 1 TABLET BY MOUTH DAILY  BEFORE BREAKFAST   ondansetron (ZOFRAN) 4 MG tablet Take 1 tablet (4 mg total) by mouth every 8 (eight) hours as needed for nausea or vomiting.   sodium bicarbonate 650 MG tablet Take 650 mg by mouth 2 (two) times daily.   oseltamivir (TAMIFLU) 30 MG capsule Take 1 capsule (30 mg total) by mouth 2 (two) times daily. (Patient not taking:  Reported on 04/07/2022)   No facility-administered encounter medications on file as of 04/07/2022.    Allergies (verified) Other and Sulfa antibiotics   History: Past Medical History:  Diagnosis Date   Diabetes ([place])    Diabetic neuropathy ([place])    Essential hypertension, benign 06/01/2019   Hyperlipidemia    Past Surgical History:  Procedure Laterality Date   APPENDECTOMY  1980   CYST REMOVAL TRUNK     Family History  Problem Relation Age of Onset   Stroke Mother    Diabetes Mother    Hypertension Mother    Hyperlipidemia Mother    Kidney disease Mother    COPD Father    Diabetes Sister    Stroke Brother    Diabetes  Brother    Arthritis Maternal Grandmother    Social History   Socioeconomic History   Marital status: Single    Spouse name: Not on file   Number of children: 3   Years of education: Not on file   Highest education level: Not on file  Occupational History   Occupation: retired  Tobacco Use   Smoking status: Never   Smokeless tobacco: Never  Vaping Use   Vaping Use: Never used  Substance and Sexual Activity   Alcohol use: Not Currently   Drug use: Never   Sexual activity: Not Currently  Other Topics Concern   Not on file  Social History Narrative   Not on file   Social Determinants of Health   Financial Resource Strain: Low Risk  (04/07/2022)   Overall Financial Resource Strain (CARDIA)    Difficulty of Paying Living Expenses: Not hard at all  Food Insecurity: No Food Insecurity (04/07/2022)   Hunger Vital Sign    Worried About Running Out of Food in the Last Year: Never true    Ran Out of Food in the Last Year: Never true  Transportation Needs: No Transportation Needs (04/07/2022)   PRAPARE - Hydrologist (Medical): No    Lack of Transportation (Non-Medical): No  Physical Activity: Insufficiently Active (04/07/2022)   Exercise Vital Sign    Days of Exercise per Week: 3 days    Minutes of Exercise per Session: 30 min  Stress: No Stress Concern Present (04/07/2022)   [place]    Feeling of Stress : Not at all  Social Connections: Moderately Isolated (04/07/2022)   Social Connection and Isolation Panel [NHANES]    Frequency of Communication with Friends and Family: More than three times a week    Frequency of Social Gatherings with Friends and Family: More than three times a week    Attends Religious Services: More than 4 times per year    Active Member of [employer] or Organizations: No    Attends Archivist Meetings: Never    Marital Status: Widowed    Tobacco  Counseling Counseling given: Not Answered   Clinical Intake:  Pre-visit preparation completed: Yes  Pain : No/denies pain     Nutritional Risks: None Diabetes: Yes CBG done?: No Did pt. bring in CBG monitor from home?: No  How often do you need to have someone help you when you read instructions, pamphlets, or other written materials from your doctor or pharmacy?: 1 - Never  Diabetic?yes Nutrition Risk Assessment:  Has the patient had any N/V/D within the last 2 months?  No  Does the patient have any non-healing wounds?  No  Has the patient  had any unintentional weight loss or weight gain?  No   Diabetes:  Is the patient diabetic?  Yes  If diabetic, was a CBG obtained today?  No  Did the patient bring in their glucometer from home?  No  How often do you monitor your CBG's? 3 times a day .   Financial Strains and Diabetes Management:  Are you having any financial strains with the device, your supplies or your medication? No .  Does the patient want to be seen by Chronic Care Management for management of their diabetes?  No  Would the patient like to be referred to a Nutritionist or for Diabetic Management?  No   Diabetic Exams:  Diabetic Eye Exam: Completed 02/2022 Diabetic Foot Exam: Overdue, Pt has been advised about the importance in completing this exam. Pt is scheduled for diabetic foot exam on next office visit .   Interpreter Needed?: No  Information entered by :: Jadene Pierini, LPN   Activities of Daily Living    04/07/2022    1:46 PM  In your present state of health, do you have any difficulty performing the following activities:  Hearing? 0  Vision? 0  Difficulty concentrating or making decisions? 0  Walking or climbing stairs? 0  Dressing or bathing? 0  Doing errands, shopping? 0  Preparing Food and eating ? N  Using the Toilet? N  In the past six months, have you accidently leaked urine? N  Do you have problems with loss of bowel control? N   Managing your Medications? N  Managing your Finances? N  Housekeeping or managing your Housekeeping? N    Patient Care Team: Janora Norlander, DO as PCP - General (Family Medicine) Lavera Guise, [facility] (Pharmacist) Liana Gerold, MD as Consulting Physician (Nephrology) Derek Jack, MD as Consulting Physician (Hematology) Brita Romp, NP as Nurse Practitioner (Nurse Practitioner)  Indicate any recent Medical Services you may have received from other than [facility] providers in the past year (date may be approximate).     Assessment:   This is a routine wellness examination for [facility].  Hearing/Vision screen Vision Screening - Comments:: Wears rx glasses - up to date with routine eye exams with  [facility]  Dietary issues and exercise activities discussed: Current Exercise Habits: Home exercise routine, Type of exercise: walking, Time (Minutes): 30, Frequency (Times/Week): 3, Weekly Exercise (Minutes/Week): 90, Exercise limited by: None identified   Goals Addressed             This Visit's Progress    Patient Stated   On track    01/17/2021 AWV Goal: Exercise for General Health  Patient will verbalize understanding of the benefits of increased physical activity: Exercising regularly is important. It will improve your overall fitness, flexibility, and endurance. Regular exercise also will improve your overall health. It can help you control your weight, reduce stress, and improve your bone density. Over the next year, patient will increase physical activity as tolerated with a goal of at least 150 minutes of moderate physical activity per week.  You can tell that you are exercising at a moderate intensity if your heart starts beating faster and you start breathing faster but can still hold a conversation. Moderate-intensity exercise ideas include: Walking 1 mile (1.6 km) in about 15 minutes Biking Hiking Golfing Dancing Water aerobics Patient  will verbalize understanding of everyday activities that increase physical activity by providing examples like the following: Yard work, such as: Industrial/product designer  mower Raking and bagging leaves Washing your car Pushing a stroller Shoveling snow Gardening Washing windows or floors Patient will be able to explain general safety guidelines for exercising:  Before you start a new exercise program, talk with your health care provider. Do not exercise so much that you hurt yourself, feel dizzy, or get very short of breath. Wear comfortable clothes and wear shoes with good support. Drink plenty of water while you exercise to prevent dehydration or heat stroke. Work out until your breathing and your heartbeat get faster.        Depression Screen    04/07/2022    1:46 PM 02/12/2022   10:20 AM 09/09/2021   10:21 AM 03/12/2021   10:58 AM 01/23/2021   11:18 AM 01/17/2021   11:10 AM 01/02/2021    1:15 PM  PHQ 2/9 Scores  PHQ - 2 Score 0 0 0 0 0 0 0  PHQ- 9 Score 0 0  0 0  0    Fall Risk    04/07/2022    1:45 PM 09/09/2021   10:21 AM 03/12/2021   10:58 AM 01/23/2021   11:18 AM 01/17/2021   11:10 AM  Fall Risk   Falls in the past year? 0 0 0 0 0  Number falls in past yr: 0      Injury with Fall? 0      Risk for fall due to : No Fall Risks      Follow up Falls prevention discussed    Falls evaluation completed    FALL RISK PREVENTION PERTAINING TO THE HOME:  Any stairs in or around the home? No  If so, are there any without handrails? No  Home free of loose throw rugs in walkways, pet beds, electrical cords, etc? Yes  Adequate lighting in your home to reduce risk of falls? Yes   ASSISTIVE DEVICES UTILIZED TO PREVENT FALLS:  Life alert? No  Use of a cane, walker or w/c? No  Grab bars in the bathroom? No  Shower chair or bench in shower? No  Elevated toilet seat or a handicapped toilet? No        04/07/2022    1:47 PM 01/17/2021   11:08 AM  6CIT Screen  What Year? 0 points 0  points  What month? 0 points 0 points  What time? 0 points 0 points  Count back from 20 0 points 0 points  Months in reverse 0 points 0 points  Repeat phrase 0 points 0 points  Total Score 0 points 0 points    Immunizations Immunization History  Administered Date(s) Administered   Fluad Quad(high Dose 65+) 11/02/2019, 12/27/2020   Influenza Inj Mdck Quad With Preservative 11/26/2015   Influenza,inj,Quad PF,6+ Mos 12/25/2016   Pneumococcal Conjugate-13 12/27/2018   Pneumococcal Polysaccharide-23 04/16/2017   Tdap 08/19/2017   Zoster Recombinat (Shingrix) 07/28/2019, 09/07/2020    TDAP status: Up to date  Flu Vaccine status: Up to date  Pneumococcal vaccine status: Up to date  Covid-19 vaccine status: Completed vaccines  Qualifies for Shingles Vaccine? Yes   Zostavax completed Yes   Shingrix Completed?: Yes  Screening Tests Health Maintenance  Topic Date Due   COVID-19 Vaccine (1) Never done   Diabetic kidney evaluation - Urine ACR  08/20/2021   FOOT EXAM  09/07/2021   OPHTHALMOLOGY EXAM  11/20/2021   INFLUENZA VACCINE  05/11/2022 (Originally 09/10/2021)   HEMOGLOBIN A1C  05/31/2022   Fecal DNA (Cologuard)  07/25/2022   Diabetic kidney evaluation -  eGFR measurement  08/16/2022   Medicare Annual Wellness (AWV)  04/08/2023   Pneumonia Vaccine 65+ Years old (56 of 3 - PPSV23 or PCV20) 12/27/2023   DTaP/Tdap/Td (2 - Td or Tdap) 08/20/2027   Hepatitis C Screening  Completed   Zoster Vaccines- Shingrix  Completed   HPV VACCINES  Aged Out    Health Maintenance  Health Maintenance Due  Topic Date Due   COVID-19 Vaccine (1) Never done   Diabetic kidney evaluation - Urine ACR  08/20/2021   FOOT EXAM  09/07/2021   OPHTHALMOLOGY EXAM  11/20/2021    Colorectal cancer screening: Type of screening: Cologuard. Completed 07/25/2019. Repeat every 3 years  Lung Cancer Screening: (Low Dose CT Chest recommended if Age 66-80 years, 30 pack-year currently smoking OR have quit w/in  15years.) does not qualify.   Lung Cancer Screening Referral: n/a  Additional Screening:  Hepatitis C Screening: does not qualify; Completed 03/13/2017  Vision Screening: Recommended annual ophthalmology exams for early detection of glaucoma and other disorders of the eye. Is the patient up to date with their annual eye exam?  Yes  Who is the provider or what is the name of the office in which the patient attends annual eye exams? [place] If pt is not established with a provider, would they like to be referred to a provider to establish care? No .   Dental Screening: Recommended annual dental exams for proper oral hygiene  Community Resource Referral / Chronic Care Management: CRR required this visit?  No   CCM required this visit?  No      Plan:     I have personally reviewed and noted the following in the patient's chart:   Medical and social history Use of alcohol, tobacco or illicit drugs  Current medications and supplements including opioid prescriptions. Patient is not currently taking opioid prescriptions. Functional ability and status Nutritional status Physical activity Advanced directives List of other physicians Hospitalizations, surgeries, and ER visits in previous 12 months Vitals Screenings to include cognitive, depression, and falls Referrals and appointments  In addition, I have reviewed and discussed with patient certain preventive protocols, quality metrics, and best practice recommendations. A written personalized care plan for preventive services as well as general preventive health recommendations were provided to patient.     Daphane Shepherd, LPN   [id number]   Nurse Notes: Due Cologuard in 07/2022

## 2022-04-07 NOTE — Patient Instructions (Signed)
Anthony French , Thank you for taking time to come for your Medicare Wellness Visit. I appreciate your ongoing commitment to your health goals. Please review the following plan we discussed and let me know if I can assist you in the future.   These are the goals we discussed:  Goals      Patient Stated     01/17/2021 AWV Goal: Exercise for General Health  Patient will verbalize understanding of the benefits of increased physical activity: Exercising regularly is important. It will improve your overall fitness, flexibility, and endurance. Regular exercise also will improve your overall health. It can help you control your weight, reduce stress, and improve your bone density. Over the next year, patient will increase physical activity as tolerated with a goal of at least 150 minutes of moderate physical activity per week.  You can tell that you are exercising at a moderate intensity if your heart starts beating faster and you start breathing faster but can still hold a conversation. Moderate-intensity exercise ideas include: Walking 1 mile (1.6 km) in about 15 minutes Biking Hiking Golfing Dancing Water aerobics Patient will verbalize understanding of everyday activities that increase physical activity by providing examples like the following: Yard work, such as: Sales promotion account executive Gardening Washing windows or floors Patient will be able to explain general safety guidelines for exercising:  Before you start a new exercise program, talk with your health care provider. Do not exercise so much that you hurt yourself, feel dizzy, or get very short of breath. Wear comfortable clothes and wear shoes with good support. Drink plenty of water while you exercise to prevent dehydration or heat stroke. Work out until your breathing and your heartbeat get faster.         This is a list of the screening recommended for  you and due dates:  Health Maintenance  Topic Date Due   COVID-19 Vaccine (1) Never done   Yearly kidney health urinalysis for diabetes  08/20/2021   Complete foot exam   09/07/2021   Eye exam for diabetics  11/20/2021   Flu Shot  05/11/2022*   Hemoglobin A1C  05/31/2022   Cologuard (Stool DNA test)  07/25/2022   Yearly kidney function blood test for diabetes  08/16/2022   Medicare Annual Wellness Visit  04/08/2023   Pneumonia Vaccine (3 of 3 - PPSV23 or PCV20) 12/27/2023   DTaP/Tdap/Td vaccine (2 - Td or Tdap) 08/20/2027   Hepatitis C Screening: USPSTF Recommendation to screen - Ages 53-79 yo.  Completed   Zoster (Shingles) Vaccine  Completed   HPV Vaccine  Aged Out  *Topic was postponed. The date shown is not the original due date.    Advanced directives: Advance directive discussed with you today. I have provided a copy for you to complete at home and have notarized. Once this is complete please bring a copy in to our office so we can scan it into your chart.   Conditions/risks identified: Aim for 30 minutes of exercise or brisk walking, 6-8 glasses of water, and 5 servings of fruits and vegetables each day.   Next appointment: Follow up in one year for your annual wellness visit.   Preventive Care 20 Years and Older, Male  Preventive care refers to lifestyle choices and visits with your health care provider that can promote health and wellness. What does preventive care include? A yearly physical exam. This is also called an  annual well check. Dental exams once or twice a year. Routine eye exams. Ask your health care provider how often you should have your eyes checked. Personal lifestyle choices, including: Daily care of your teeth and gums. Regular physical activity. Eating a healthy diet. Avoiding tobacco and drug use. Limiting alcohol use. Practicing safe sex. Taking low doses of aspirin every day. Taking vitamin and mineral supplements as recommended by your health  care provider. What happens during an annual well check? The services and screenings done by your health care provider during your annual well check will depend on your age, overall health, lifestyle risk factors, and family history of disease. Counseling  Your health care provider may ask you questions about your: Alcohol use. Tobacco use. Drug use. Emotional well-being. Home and relationship well-being. Sexual activity. Eating habits. History of falls. Memory and ability to understand (cognition). Work and work Statistician. Screening  You may have the following tests or measurements: Height, weight, and BMI. Blood pressure. Lipid and cholesterol levels. These may be checked every 5 years, or more frequently if you are over 53 years old. Skin check. Lung cancer screening. You may have this screening every year starting at age 19 if you have a 30-pack-year history of smoking and currently smoke or have quit within the past 15 years. Fecal occult blood test (FOBT) of the stool. You may have this test every year starting at age 27. Flexible sigmoidoscopy or colonoscopy. You may have a sigmoidoscopy every 5 years or a colonoscopy every 10 years starting at age 55. Prostate cancer screening. Recommendations will vary depending on your family history and other risks. Hepatitis C blood test. Hepatitis B blood test. Sexually transmitted disease (STD) testing. Diabetes screening. This is done by checking your blood sugar (glucose) after you have not eaten for a while (fasting). You may have this done every 1-3 years. Abdominal aortic aneurysm (AAA) screening. You may need this if you are a current or former smoker. Osteoporosis. You may be screened starting at age 28 if you are at high risk. Talk with your health care provider about your test results, treatment options, and if necessary, the need for more tests. Vaccines  Your health care provider may recommend certain vaccines, such  as: Influenza vaccine. This is recommended every year. Tetanus, diphtheria, and acellular pertussis (Tdap, Td) vaccine. You may need a Td booster every 10 years. Zoster vaccine. You may need this after age 71. Pneumococcal 13-valent conjugate (PCV13) vaccine. One dose is recommended after age 60. Pneumococcal polysaccharide (PPSV23) vaccine. One dose is recommended after age 75. Talk to your health care provider about which screenings and vaccines you need and how often you need them. This information is not intended to replace advice given to you by your health care provider. Make sure you discuss any questions you have with your health care provider. Document Released: 02/23/2015 Document Revised: 10/17/2015 Document Reviewed: 11/28/2014 Elsevier Interactive Patient Education  2017 [place] Prevention in the Home Falls can cause injuries. They can happen to people of all ages. There are many things you can do to make your home safe and to help prevent falls. What can I do on the outside of my home? Regularly fix the edges of walkways and driveways and fix any cracks. Remove anything that might make you trip as you walk through a door, such as a raised step or threshold. Trim any bushes or trees on the path to your home. Use bright outdoor lighting. Clear  any walking paths of anything that might make someone trip, such as rocks or tools. Regularly check to see if handrails are loose or broken. Make sure that both sides of any steps have handrails. Any raised decks and porches should have guardrails on the edges. Have any leaves, snow, or ice cleared regularly. Use sand or salt on walking paths during winter. Clean up any spills in your garage right away. This includes oil or grease spills. What can I do in the bathroom? Use night lights. Install grab bars by the toilet and in the tub and shower. Do not use towel bars as grab bars. Use non-skid mats or decals in the tub or  shower. If you need to sit down in the shower, use a plastic, non-slip stool. Keep the floor dry. Clean up any water that spills on the floor as soon as it happens. Remove soap buildup in the tub or shower regularly. Attach bath mats securely with double-sided non-slip rug tape. Do not have throw rugs and other things on the floor that can make you trip. What can I do in the bedroom? Use night lights. Make sure that you have a light by your bed that is easy to reach. Do not use any sheets or blankets that are too big for your bed. They should not hang down onto the floor. Have a firm chair that has side arms. You can use this for support while you get dressed. Do not have throw rugs and other things on the floor that can make you trip. What can I do in the kitchen? Clean up any spills right away. Avoid walking on wet floors. Keep items that you use a lot in easy-to-reach places. If you need to reach something above you, use a strong step stool that has a grab bar. Keep electrical cords out of the way. Do not use floor polish or wax that makes floors slippery. If you must use wax, use non-skid floor wax. Do not have throw rugs and other things on the floor that can make you trip. What can I do with my stairs? Do not leave any items on the stairs. Make sure that there are handrails on both sides of the stairs and use them. Fix handrails that are broken or loose. Make sure that handrails are as long as the stairways. Check any carpeting to make sure that it is firmly attached to the stairs. Fix any carpet that is loose or worn. Avoid having throw rugs at the top or bottom of the stairs. If you do have throw rugs, attach them to the floor with carpet tape. Make sure that you have a light switch at the top of the stairs and the bottom of the stairs. If you do not have them, ask someone to add them for you. What else can I do to help prevent falls? Wear shoes that: Do not have high heels. Have  rubber bottoms. Are comfortable and fit you well. Are closed at the toe. Do not wear sandals. If you use a stepladder: Make sure that it is fully opened. Do not climb a closed stepladder. Make sure that both sides of the stepladder are locked into place. Ask someone to hold it for you, if possible. Clearly mark and make sure that you can see: Any grab bars or handrails. First and last steps. Where the edge of each step is. Use tools that help you move around (mobility aids) if they are needed. These include: Canes. Walkers.  Scooters. Crutches. Turn on the lights when you go into a dark area. Replace any light bulbs as soon as they burn out. Set up your furniture so you have a clear path. Avoid moving your furniture around. If any of your floors are uneven, fix them. If there are any pets around you, be aware of where they are. Review your medicines with your doctor. Some medicines can make you feel dizzy. This can increase your chance of falling. Ask your doctor what other things that you can do to help prevent falls. This information is not intended to replace advice given to you by your health care provider. Make sure you discuss any questions you have with your health care provider. Document Released: 11/23/2008 Document Revised: 07/05/2015 Document Reviewed: 03/03/2014 Elsevier Interactive Patient Education  2017 [employer].

## 2022-04-18 DIAGNOSIS — Z794 Long term (current) use of insulin: Secondary | ICD-10-CM | POA: Diagnosis not present

## 2022-04-18 DIAGNOSIS — E119 Type 2 diabetes mellitus without complications: Secondary | ICD-10-CM | POA: Diagnosis not present

## 2022-04-23 ENCOUNTER — Other Ambulatory Visit: Payer: 59

## 2022-04-23 DIAGNOSIS — E211 Secondary hyperparathyroidism, not elsewhere classified: Secondary | ICD-10-CM | POA: Diagnosis not present

## 2022-04-23 DIAGNOSIS — D638 Anemia in other chronic diseases classified elsewhere: Secondary | ICD-10-CM | POA: Diagnosis not present

## 2022-04-23 DIAGNOSIS — N189 Chronic kidney disease, unspecified: Secondary | ICD-10-CM | POA: Diagnosis not present

## 2022-04-23 DIAGNOSIS — I129 Hypertensive chronic kidney disease with stage 1 through stage 4 chronic kidney disease, or unspecified chronic kidney disease: Secondary | ICD-10-CM | POA: Diagnosis not present

## 2022-04-23 DIAGNOSIS — E1122 Type 2 diabetes mellitus with diabetic chronic kidney disease: Secondary | ICD-10-CM | POA: Diagnosis not present

## 2022-04-23 LAB — LAB REPORT - SCANNED: EGFR (Non-African Amer.): 18

## 2022-04-24 ENCOUNTER — Encounter: Payer: Self-pay | Admitting: Podiatry

## 2022-04-24 ENCOUNTER — Ambulatory Visit (INDEPENDENT_AMBULATORY_CARE_PROVIDER_SITE_OTHER): Payer: 59 | Admitting: Podiatry

## 2022-04-24 DIAGNOSIS — E1142 Type 2 diabetes mellitus with diabetic polyneuropathy: Secondary | ICD-10-CM | POA: Diagnosis not present

## 2022-04-24 DIAGNOSIS — M79609 Pain in unspecified limb: Secondary | ICD-10-CM | POA: Diagnosis not present

## 2022-04-24 DIAGNOSIS — B351 Tinea unguium: Secondary | ICD-10-CM

## 2022-04-24 NOTE — Progress Notes (Signed)
Subjective: Anthony French is a 67 y.o. male patient with history of diabetes who presents to office today with a chief concern of long,mildly painful nails  while ambulating in shoes; unable to trim.  Patient denies any new cramping, numbness, burning or tingling in the legs. His last HgA1C was 6.3 on 11/29/21  Patient denies any new changes in medication or new problems.  Referring Provider: Janora Norlander, DO   Patient Active Problem List   Diagnosis Date Noted   Nodular basal cell carcinoma (BCC) 10/28/2021   Cough 03/14/2020   Nausea 03/14/2020   Sore throat 03/14/2020   Type 2 diabetes mellitus with stage 3b chronic kidney disease, with long-term current use of insulin ([place]) 06/01/2019   Mixed hyperlipidemia 06/01/2019   Essential hypertension, benign 06/01/2019   Thrombocytopenia ([place]) 05/11/2019   Diabetic retinopathy ([place]) 11/06/2017   Acute hypoxemic respiratory failure ([place]) 07/11/2017   Pneumonia of right lower lobe due to Haemophilus influenzae ([place]) 07/11/2017   Sepsis ([place]) 07/11/2017   CKD stage 3 due to type 2 diabetes mellitus ([place]) 06/17/2017   Actinic keratosis 04/16/2017   Diabetic polyneuropathy associated with type 2 diabetes mellitus ([place]) 03/13/2017   Foot callus 03/13/2017   Trigger middle finger of left hand 03/13/2017   Diabetes mellitus ([place]) 03/27/2015   Hyperlipidemia associated with type 2 diabetes mellitus ([place]) 03/25/2011   Dyslipidemia 03/25/2011   Current Outpatient Medications on File Prior to Visit  Medication Sig Dispense Refill   amLODipine (NORVASC) 10 MG tablet TAKE 1 TABLET BY MOUTH DAILY 90 tablet 3   atorvastatin (LIPITOR) 40 MG tablet TAKE 1 TABLET BY MOUTH DAILY 100 tablet 1   blood glucose meter kit and supplies Dispense based on patient and insurance preference. Use up to four times daily as directed. (FOR ICD-10 E10.9, E11.9). Check blood sugar twice a day as directed. 1 each 0   chlorthalidone (HYGROTON) 25 MG tablet Take 25 mg  by mouth daily.     Continuous Blood Gluc Receiver (FREESTYLE LIBRE 2 READER) DEVI Use to test blood sugars up to 6 times daily. DX: E11.9 1 each 0   Continuous Blood Gluc Sensor (FREESTYLE LIBRE 2 SENSOR) MISC Use to test blood sugars up to 6 times daily. DX: E11.9 6 each 2   [facility] D3 25 MCG (1000 UT) capsule Take 1,000 Units by mouth daily.     Efinaconazole (JUBLIA) 10 % SOLN Apply 1 application topically daily. 4 mL 5   gabapentin (NEURONTIN) 300 MG capsule TAKE 1 CAPSULE BY MOUTH IN THE  MORNING AND TAKE 3 CAPSULES BY  MOUTH IN THE EVENING 360 capsule 0   glucose blood (ACCU-CHEK GUIDE) test strip Test blood sugar 4 times per day, before meals and at bedtime. 500 strip 3   guaiFENesin (MUCINEX) 600 MG 12 hr tablet Take 1 tablet (600 mg total) by mouth 2 (two) times daily. 30 tablet 0   hydrALAZINE (APRESOLINE) 50 MG tablet Take 50 mg by mouth 2 (two) times daily.     insulin NPH-regular Human (70-30) 100 UNIT/ML injection Inject 20 Units into the skin 2 (two) times daily before a meal.     Insulin Pen Needle (PEN NEEDLES) 31G X 8 MM MISC 1 each by Does not apply route in the morning and at bedtime. 100 each 11   Insulin Syringe-Needle U-100 (BD INSULIN SYRINGE U/F) 31G X 5/16" 0.3 ML MISC USE WITH INSULIN TWICE  DAILY DX E11.22 200 each 3   levothyroxine (SYNTHROID) 50 MCG  tablet TAKE 1 TABLET BY MOUTH DAILY  BEFORE BREAKFAST 100 tablet 2   ondansetron (ZOFRAN) 4 MG tablet Take 1 tablet (4 mg total) by mouth every 8 (eight) hours as needed for nausea or vomiting. 20 tablet 0   oseltamivir (TAMIFLU) 30 MG capsule Take 1 capsule (30 mg total) by mouth 2 (two) times daily. (Patient not taking: Reported on 04/07/2022) 10 capsule 0   sodium bicarbonate 650 MG tablet Take 650 mg by mouth 2 (two) times daily.     No current facility-administered medications on file prior to visit.   Allergies  Allergen Reactions   Other Hives    Pt states that he is allergic to an ABT but he can not remember what it  is   Sulfa Antibiotics Hives      Objective: General: Patient is awake, alert, and oriented x 3 and in no acute distress.  Integument: Skin is warm, dry and supple bilateral. Nails are tender, long, thickened and  dystrophic with subungual debris, consistent with onychomycosis 1 left. Remainder of nails are elongated. No signs of infection. No open lesions or preulcerative lesions present bilateral. Remaining integument unremarkable.  Vasculature:  Dorsalis Pedis pulse 2/4 bilateral. Posterior Tibial pulse  1/4 bilateral.  Capillary fill time <3 sec 1-5 bilateral. Temperature gradient within normal limits. no varicosities present bilateral. no edema present bilateral.   Neurology: The patient has intact sensation measured with a 5.07/10g Semmes Weinstein Monofilament at  5/5 pedal sites bilateral . Vibratory sensation diminished bilateral with tuning fork. No Babinski sign present bilateral.   Musculoskeletal: No symptomatic pedal deformities noted bilateral. Muscular strength 5/5 in all lower extremity muscular groups bilateral without pain on range of motion . No tenderness with calf compression bilateral.  Assessment and Plan:   ICD-10-CM   1. Pain due to onychomycosis of nail  B35.1    M79.609     2. Diabetic polyneuropathy associated with type 2 diabetes mellitus (HCC)  E11.42        -Examined patient. -Mechanically debrided all nails 1-5 bilateral using sterile nail nipper and filed with dremel without incident  -Answered all patient questions -Patient to return  in 3 months for at risk foot care -Patient advised to call the office if any problems or questions arise in the meantime.  Lorenda Peck, DPM

## 2022-04-28 DIAGNOSIS — D638 Anemia in other chronic diseases classified elsewhere: Secondary | ICD-10-CM | POA: Diagnosis not present

## 2022-04-28 DIAGNOSIS — N170 Acute kidney failure with tubular necrosis: Secondary | ICD-10-CM | POA: Diagnosis not present

## 2022-04-28 DIAGNOSIS — E8722 Chronic metabolic acidosis: Secondary | ICD-10-CM | POA: Diagnosis not present

## 2022-04-28 DIAGNOSIS — N189 Chronic kidney disease, unspecified: Secondary | ICD-10-CM | POA: Diagnosis not present

## 2022-04-28 DIAGNOSIS — E211 Secondary hyperparathyroidism, not elsewhere classified: Secondary | ICD-10-CM | POA: Diagnosis not present

## 2022-04-28 DIAGNOSIS — I129 Hypertensive chronic kidney disease with stage 1 through stage 4 chronic kidney disease, or unspecified chronic kidney disease: Secondary | ICD-10-CM | POA: Diagnosis not present

## 2022-04-28 DIAGNOSIS — E1129 Type 2 diabetes mellitus with other diabetic kidney complication: Secondary | ICD-10-CM | POA: Diagnosis not present

## 2022-04-28 DIAGNOSIS — R809 Proteinuria, unspecified: Secondary | ICD-10-CM | POA: Diagnosis not present

## 2022-04-28 DIAGNOSIS — E875 Hyperkalemia: Secondary | ICD-10-CM | POA: Diagnosis not present

## 2022-04-28 DIAGNOSIS — E1122 Type 2 diabetes mellitus with diabetic chronic kidney disease: Secondary | ICD-10-CM | POA: Diagnosis not present

## 2022-04-29 ENCOUNTER — Other Ambulatory Visit (HOSPITAL_COMMUNITY): Payer: Self-pay | Admitting: Nephrology

## 2022-04-29 DIAGNOSIS — E875 Hyperkalemia: Secondary | ICD-10-CM

## 2022-04-29 DIAGNOSIS — E1122 Type 2 diabetes mellitus with diabetic chronic kidney disease: Secondary | ICD-10-CM

## 2022-04-29 DIAGNOSIS — I129 Hypertensive chronic kidney disease with stage 1 through stage 4 chronic kidney disease, or unspecified chronic kidney disease: Secondary | ICD-10-CM

## 2022-04-29 DIAGNOSIS — N2581 Secondary hyperparathyroidism of renal origin: Secondary | ICD-10-CM

## 2022-04-29 DIAGNOSIS — E1129 Type 2 diabetes mellitus with other diabetic kidney complication: Secondary | ICD-10-CM

## 2022-05-05 ENCOUNTER — Ambulatory Visit (INDEPENDENT_AMBULATORY_CARE_PROVIDER_SITE_OTHER): Payer: 59 | Admitting: Nurse Practitioner

## 2022-05-05 ENCOUNTER — Encounter: Payer: Self-pay | Admitting: Nurse Practitioner

## 2022-05-05 VITALS — BP 130/76 | HR 64 | Ht 69.0 in | Wt 212.6 lb

## 2022-05-05 DIAGNOSIS — I10 Essential (primary) hypertension: Secondary | ICD-10-CM

## 2022-05-05 DIAGNOSIS — E039 Hypothyroidism, unspecified: Secondary | ICD-10-CM

## 2022-05-05 DIAGNOSIS — Z794 Long term (current) use of insulin: Secondary | ICD-10-CM

## 2022-05-05 DIAGNOSIS — N184 Chronic kidney disease, stage 4 (severe): Secondary | ICD-10-CM | POA: Diagnosis not present

## 2022-05-05 DIAGNOSIS — E1122 Type 2 diabetes mellitus with diabetic chronic kidney disease: Secondary | ICD-10-CM

## 2022-05-05 DIAGNOSIS — E782 Mixed hyperlipidemia: Secondary | ICD-10-CM

## 2022-05-05 MED ORDER — LEVOTHYROXINE SODIUM 50 MCG PO TABS: 50.0000 ug | ORAL_TABLET | Freq: Every day | ORAL | 2 refills | Status: DC

## 2022-05-05 MED ORDER — ACCU-CHEK GUIDE VI STRP: ORAL_STRIP | 3 refills | Status: AC

## 2022-05-05 NOTE — Addendum Note (Signed)
Addended by: Brita Romp on: 05/05/2022 04:27 PM   Modules accepted: Level of Service

## 2022-05-05 NOTE — Progress Notes (Signed)
05/05/2022, 3:59 PM  Endocrinology follow-up note   Subjective:    Patient ID: Anthony French, male    DOB: 1955/03/07.  Anthony French is being seen in follow-up after he was seen in consultation for management of currently uncontrolled symptomatic diabetes requested by  Janora Norlander, DO.   Past Medical History:  Diagnosis Date   Diabetes ([place])    Diabetic neuropathy ([place])    Essential hypertension, benign 06/01/2019   Hyperlipidemia     Past Surgical History:  Procedure Laterality Date   APPENDECTOMY  1980   CYST REMOVAL TRUNK      Social History   Socioeconomic History   Marital status: Single    Spouse name: Not on file   Number of children: 3   Years of education: Not on file   Highest education level: Not on file  Occupational History   Occupation: retired  Tobacco Use   Smoking status: Never   Smokeless tobacco: Never  Vaping Use   Vaping Use: Never used  Substance and Sexual Activity   Alcohol use: Not Currently   Drug use: Never   Sexual activity: Not Currently  Other Topics Concern   Not on file  Social History Narrative   Not on file   Social Determinants of Health   Financial Resource Strain: Low Risk  (04/07/2022)   Overall Financial Resource Strain (CARDIA)    Difficulty of Paying Living Expenses: Not hard at all  Food Insecurity: No Food Insecurity (04/07/2022)   Hunger Vital Sign    Worried About Running Out of Food in the Last Year: Never true    [place] in the Last Year: Never true  Transportation Needs: No Transportation Needs (04/07/2022)   PRAPARE - Hydrologist (Medical): No    Lack of Transportation (Non-Medical): No  Physical Activity: Insufficiently Active (04/07/2022)   Exercise Vital Sign    Days of Exercise per Week: 3 days    Minutes of Exercise per Session: 30 min  Stress: No Stress Concern Present (04/07/2022)   [place]    Feeling of Stress : Not at all  Social Connections: Moderately Isolated (04/07/2022)   Social Connection and Isolation Panel [NHANES]    Frequency of Communication with Friends and Family: More than three times a week    Frequency of Social Gatherings with Friends and Family: More than three times a week    Attends Religious Services: More than 4 times per year    Active Member of [employer] or Organizations: No    Attends Archivist Meetings: Never    Marital Status: Widowed    Family History  Problem Relation Age of Onset   Stroke Mother    Diabetes Mother    Hypertension Mother    Hyperlipidemia Mother    Kidney disease Mother    COPD Father    Diabetes Sister    Stroke Brother    Diabetes Brother    Arthritis Maternal Grandmother     Outpatient Encounter Medications as of 05/05/2022  Medication Sig   amLODipine (NORVASC) 10 MG tablet TAKE 1 TABLET BY MOUTH DAILY   atorvastatin (LIPITOR) 40 MG tablet TAKE 1 TABLET BY MOUTH DAILY   blood glucose meter kit and supplies Dispense based on patient and insurance preference. Use up to four times daily as directed. (FOR ICD-10 E10.9, E11.9). Check blood sugar twice  a day as directed.   chlorthalidone (HYGROTON) 25 MG tablet Take 25 mg by mouth daily.   Continuous Blood Gluc Receiver (FREESTYLE LIBRE 2 READER) DEVI Use to test blood sugars up to 6 times daily. DX: E11.9   Continuous Blood Gluc Sensor (FREESTYLE LIBRE 2 SENSOR) MISC Use to test blood sugars up to 6 times daily. DX: E11.9   [employer] D3 25 MCG (1000 UT) capsule Take 1,000 Units by mouth daily.   Efinaconazole (JUBLIA) 10 % SOLN Apply 1 application topically daily.   gabapentin (NEURONTIN) 300 MG capsule TAKE 1 CAPSULE BY MOUTH IN THE  MORNING AND TAKE 3 CAPSULES BY  MOUTH IN THE EVENING   insulin NPH-regular Human (70-30) 100 UNIT/ML injection Inject 20 Units into the skin 2 (two) times daily before a meal.   Insulin Pen Needle (PEN  NEEDLES) 31G X 8 MM MISC 1 each by Does not apply route in the morning and at bedtime.   Insulin Syringe-Needle U-100 (BD INSULIN SYRINGE U/F) 31G X 5/16" 0.3 ML MISC USE WITH INSULIN TWICE  DAILY DX E11.22   sodium bicarbonate 650 MG tablet Take 650 mg by mouth 2 (two) times daily.   [DISCONTINUED] glucose blood (ACCU-CHEK GUIDE) test strip Test blood sugar 4 times per day, before meals and at bedtime.   [DISCONTINUED] levothyroxine (SYNTHROID) 50 MCG tablet TAKE 1 TABLET BY MOUTH DAILY  BEFORE BREAKFAST   glucose blood (ACCU-CHEK GUIDE) test strip Test blood sugar 4 times per day, before meals and at bedtime.   levothyroxine (SYNTHROID) 50 MCG tablet Take 1 tablet (50 mcg total) by mouth daily before breakfast.   [DISCONTINUED] guaiFENesin (MUCINEX) 600 MG 12 hr tablet Take 1 tablet (600 mg total) by mouth 2 (two) times daily. (Patient not taking: Reported on 05/05/2022)   [DISCONTINUED] hydrALAZINE (APRESOLINE) 50 MG tablet Take 50 mg by mouth 2 (two) times daily. (Patient not taking: Reported on 05/05/2022)   [DISCONTINUED] ondansetron (ZOFRAN) 4 MG tablet Take 1 tablet (4 mg total) by mouth every 8 (eight) hours as needed for nausea or vomiting. (Patient not taking: Reported on 05/05/2022)   [DISCONTINUED] oseltamivir (TAMIFLU) 30 MG capsule Take 1 capsule (30 mg total) by mouth 2 (two) times daily. (Patient not taking: Reported on 04/07/2022)   No facility-administered encounter medications on file as of 05/05/2022.    ALLERGIES: Allergies  Allergen Reactions   Other Hives    Pt states that he is allergic to an ABT but he can not remember what it is   Sulfa Antibiotics Hives    VACCINATION STATUS: Immunization History  Administered Date(s) Administered   Fluad Quad(high Dose 65+) 11/02/2019, 12/27/2020   Influenza Inj Mdck Quad With Preservative 11/26/2015   Influenza,inj,Quad PF,6+ Mos 12/25/2016   Pneumococcal Conjugate-13 12/27/2018   Pneumococcal Polysaccharide-23 04/16/2017    Tdap 08/19/2017   Zoster Recombinat (Shingrix) 07/28/2019, 09/07/2020    Diabetes He presents for his follow-up diabetic visit. He has type 2 diabetes mellitus. Onset time: He was diagnosed at approximate age of 67 years. His disease course has been fluctuating. There are no hypoglycemic associated symptoms. Pertinent negatives for hypoglycemia include no headaches, nervousness/anxiousness, pallor or tremors. Pertinent negatives for diabetes include no chest pain, no fatigue, no polydipsia, no polyphagia, no polyuria and no weight loss. There are no hypoglycemic complications. Symptoms are stable. Diabetic complications include nephropathy, peripheral neuropathy and retinopathy. Risk factors for coronary artery disease include dyslipidemia, diabetes mellitus, hypertension, male sex, sedentary lifestyle and family history. Current diabetic treatment  includes insulin injections. He is compliant with treatment most of the time. His weight is fluctuating minimally. He is following a generally healthy diet. When asked about meal planning, he reported none. He has had a previous visit with a dietitian. He never participates in exercise. His home blood glucose trend is fluctuating dramatically. His overall blood glucose range is >200 mg/dl. (He presents today with his CGM and logs showing fluctuating glycemic profile overall.  His most recent A1c, checked by [facility] nurse last week was 7.2%, increasing from last A1c of 6.3%.  Analysis of his CGM shows TIR 40%, TAR 60%, TBR 0% with a GMI of 8.1%.  He notes he did forget to take his insulin on several occasions.  He also notes he has a tendency to over-correct when his CGM alarms for a low.  He is ongoing workup with nephrology.) An ACE inhibitor/angiotensin II receptor blocker is being taken. He does not see a podiatrist.Eye exam is current.  Hyperlipidemia This is a chronic problem. The current episode started more than 1 year ago. The problem is controlled. Recent lipid  tests were reviewed and are normal. Exacerbating diseases include chronic renal disease, diabetes and hypothyroidism. There are no known factors aggravating his hyperlipidemia. Pertinent negatives include no chest pain, myalgias or shortness of breath. Current antihyperlipidemic treatment includes statins. The current treatment provides significant improvement of lipids. There are no compliance problems.  Risk factors for coronary artery disease include diabetes mellitus, dyslipidemia, hypertension, male sex and a sedentary lifestyle.  Hypertension This is a chronic problem. The current episode started more than 1 year ago. The problem has been gradually improving since onset. The problem is controlled. Pertinent negatives include no chest pain, headaches, palpitations or shortness of breath. There are no associated agents to hypertension. Risk factors for coronary artery disease include dyslipidemia, diabetes mellitus, family history, male gender and sedentary lifestyle. Past treatments include calcium channel blockers and angiotensin blockers. The current treatment provides mild improvement. There are no compliance problems.  Hypertensive end-organ damage includes kidney disease and retinopathy. Identifiable causes of hypertension include chronic renal disease and a thyroid problem.  Thyroid Problem Presents for follow-up (Abnormality noted with recent thyroid studies.  No prior work-up done, no previous labs to compare to.) visit. Onset time: Unknown. Patient reports no anxiety, cold intolerance, constipation, diarrhea, fatigue, heat intolerance, palpitations, tremors, weight gain or weight loss. The symptoms have been stable. Past treatments include nothing. His past medical history is significant for diabetes and hyperlipidemia. There are no known risk factors.     Review of systems  Constitutional: + Minimally fluctuating body weight,  current Body mass index is 31.4 kg/m. , no fatigue, no  subjective hyperthermia, no subjective hypothermia Eyes: no blurry vision, no xerophthalmia ENT: no sore throat, no nodules palpated in throat, no dysphagia/odynophagia, no hoarseness Cardiovascular: no chest pain, no shortness of breath, no palpitations, no leg swelling Respiratory: no cough, no shortness of breath Gastrointestinal: no nausea/vomiting/diarrhea Musculoskeletal: no muscle/joint aches Skin: no rashes, no hyperemia Neurological: no tremors, no numbness, no tingling, no dizziness Psychiatric: no depression, no anxiety   Objective:    BP 130/76 (BP Location: Left Arm, Patient Position: Sitting, Cuff Size: Large) Comment: Retake Manuel Cuff  Pulse 64   Ht 5\' 9"  (1.753 m)   Wt 212 lb 9.6 oz (96.4 kg)   BMI 31.40 kg/m   Wt Readings from Last 3 Encounters:  05/05/22 212 lb 9.6 oz (96.4 kg)  04/07/22 219 lb (99.3 kg)  02/12/22 203 lb (92.1 kg)     BP Readings from Last 3 Encounters:  05/05/22 130/76  02/12/22 (!) 146/89  11/29/21 (!) 192/82     Physical Exam- Limited  Constitutional:  Body mass index is 31.4 kg/m. , not in acute distress, normal state of mind Eyes:  EOMI, no exophthalmos Musculoskeletal: no gross deformities, strength intact in all four extremities, no gross restriction of joint movements Skin:  no rashes, no hyperemia Neurological: no tremor with outstretched hands    CMP ( most recent) CMP     Component Value Date/Time   NA 145 (H) 08/15/2021 1018   K 5.6 (H) 08/15/2021 1018   CL 114 (H) 08/15/2021 1018   CO2 18 (L) 08/15/2021 1018   GLUCOSE 150 (H) 08/15/2021 1018   GLUCOSE 232 (H) 09/19/2020 1412   BUN 62 (H) 08/15/2021 1018   CREATININE 3.22 (H) 08/15/2021 1018   CALCIUM 8.9 08/15/2021 1018   PROT 6.1 08/15/2021 1018   ALBUMIN 4.1 08/15/2021 1018   AST 13 08/15/2021 1018   ALT 13 08/15/2021 1018   ALKPHOS 82 08/15/2021 1018   BILITOT 0.3 08/15/2021 1018   GFRNONAA 33 (L) 09/19/2020 1412   GFRAA 47 (L) 09/13/2019 1420     Diabetic Labs (most recent): Lab Results  Component Value Date   HGBA1C 6.3 (A) 11/29/2021   HGBA1C 5.8 (A) 08/23/2021   HGBA1C 6.0 04/23/2021   MICROALBUR 150 08/20/2020   MICROALBUR 237.3 09/13/2019     Lipid Panel ( most recent) Lipid Panel     Component Value Date/Time   CHOL 146 08/15/2021 1018   TRIG 132 08/15/2021 1018   HDL 35 (L) 08/15/2021 1018   CHOLHDL 4.2 08/15/2021 1018   LDLCALC 87 08/15/2021 1018   LABVLDL 24 08/15/2021 1018     Assessment & Plan:   1) Type 2 diabetes mellitus with stage 3b chronic kidney disease, with long-term current use of insulin (HCC)  - Amritpal Handyside has currently uncontrolled symptomatic type 2 DM since  67 years of age.   Recent labs reviewed.  He presents today with his CGM and logs showing fluctuating glycemic profile overall.  His most recent A1c, checked by [facility] nurse last week was 7.2%, increasing from last A1c of 6.3%.  Analysis of his CGM shows TIR 40%, TAR 60%, TBR 0% with a GMI of 8.1%.  He notes he did forget to take his insulin on several occasions.  He also notes he has a tendency to over-correct when his CGM alarms for a low.  He is ongoing workup with nephrology.  - I had a long discussion with him about the progressive nature of diabetes and the pathology behind its complications. -his diabetes is complicated by CKD, peripheral neuropathy and he remains at a high risk for more acute and chronic complications which include CAD, CVA, CKD, retinopathy, and neuropathy. These are all discussed in detail with him.  - Nutritional counseling repeated at each appointment due to patients tendency to fall back in to old habits.  - The patient admits there is a room for improvement in their diet and drink choices. -  Suggestion is made for the patient to avoid simple carbohydrates from their diet including Cakes, Sweet Desserts / Pastries, Ice Cream, Soda (diet and regular), Sweet Tea, Candies, Chips, Cookies, Sweet Pastries, Store  Bought Juices, Alcohol in Excess of 1-2 drinks a day, Artificial Sweeteners, Coffee Creamer, and "Sugar-free" Products. This will help patient to have stable blood glucose profile  and potentially avoid unintended weight gain.   - I encouraged the patient to switch to unprocessed or minimally processed complex starch and increased protein intake (animal or plant source), fruits, and vegetables.   - Patient is advised to stick to a routine mealtimes to eat 3 meals a day and avoid unnecessary snacks (to snack only to correct hypoglycemia).  - I have approached him with the following individualized plan to manage  his diabetes and patient agrees:   - he will continue to benefit from simplicity of premixed insulin.   -Based on his tightening glycemic profile, he is advised to lower his Novolin 70/30 to 15 units with breakfast and 10 units with supper if glucose above 90 and he is eating.  -He is encouraged to continue using his CGM to monitor blood glucose 4 times per day, before meals and before bed, and call the clinic if he has readings less than 70 or greater than 300 for 3 tests in a row.  - Specific targets for  A1c;  LDL, HDL,  and Triglycerides were discussed with the patient.  2) Blood Pressure /Hypertension:  His blood pressure is controlled to target.  He is advised to continue Chlorthalidone 25 mg po daily as well as Amlodipine 10 mg po daily as needed.  3) Lipids/Hyperlipidemia:  His most recent lipid panel from 08/15/21 shows controlled LDL of 87.  He is advised to continue Atorvastatin 40 mg po daily at bedtime.  Side effects and precautions discussed with him.    4)  Weight/Diet:  His Body mass index is 31.4 kg/m.     he is a candidate for some weight loss. I discussed with him the fact that loss of 5 - 10% of his  current body weight will have the most impact on his diabetes management.  Exercise, and detailed carbohydrates information provided  -  detailed on discharge  instructions.  5)Hypothyroidism-acquired: There is no recent TFTs to review.  He is advised to continue his Levothyroxine 50 mcg po daily before breakfast.  Will recheck TFTs prior to next visit.   - The correct intake of thyroid hormone (Levothyroxine, Synthroid), is on empty stomach first thing in the morning, with water, separated by at least 30 minutes from breakfast and other medications,  and separated by more than 4 hours from calcium, iron, multivitamins, acid reflux medications (PPIs).  - This medication is a life-long medication and will be needed to correct thyroid hormone imbalances for the rest of your life.  The dose may change from time to time, based on thyroid blood work.  - It is extremely important to be consistent taking this medication, near the same time each morning.  -AVOID TAKING PRODUCTS CONTAINING BIOTIN (commonly found in Hair, Skin, Nails vitamins) AS IT INTERFERES WITH THE VALIDITY OF THYROID FUNCTION BLOOD TESTS.  6) Chronic Care/Health Maintenance: -he is not on ARB (per nephrology) and is on Statin medications and is encouraged to initiate and continue to follow up with Ophthalmology, Dentist,  Podiatrist at least yearly or according to recommendations, and advised to stay away from smoking. I have recommended yearly flu vaccine and pneumonia vaccine at least every 5 years; moderate intensity exercise for up to 150 minutes weekly; and  sleep for at least 7 hours a day.  - he is advised to maintain close follow up with Janora Norlander, DO for primary care needs, as well as his other providers for optimal and coordinated care.     I  spent  32  minutes in the care of the patient today including review of labs from [place], Lipids, Thyroid Function, Hematology (current and previous including abstractions from other facilities); face-to-face time discussing  his blood glucose readings/logs, discussing hypoglycemia and hyperglycemia episodes and symptoms, medications  doses, his options of short and long term treatment based on the latest standards of care / guidelines;  discussion about incorporating lifestyle medicine;  and documenting the encounter. Risk reduction counseling performed per USPSTF guidelines to reduce obesity and cardiovascular risk factors.     Please refer to Patient Instructions for Blood Glucose Monitoring and Insulin/Medications Dosing Guide"  in media tab for additional information. Please  also refer to " Patient Self Inventory" in the Media  tab for reviewed elements of pertinent patient history.  [name] participated in the discussions, expressed understanding, and voiced agreement with the above plans.  All questions were answered to his satisfaction. he is encouraged to contact clinic should he have any questions or concerns prior to his return visit.   Follow up plan: - Return in about 3 months (around 08/05/2022) for Diabetes F/U with A1c in office, Thyroid follow up, Previsit labs, Bring meter and logs.  Rayetta Pigg, [facility] [address] [place], [place] [address] Phone: [phone] Fax: [phone]   05/05/2022, 3:59 PM

## 2022-05-06 ENCOUNTER — Encounter: Payer: Self-pay | Admitting: Family Medicine

## 2022-05-06 ENCOUNTER — Ambulatory Visit (INDEPENDENT_AMBULATORY_CARE_PROVIDER_SITE_OTHER): Payer: 59 | Admitting: Family Medicine

## 2022-05-06 VITALS — BP 136/74 | HR 58 | Temp 97.8°F | Ht 69.0 in | Wt 214.0 lb

## 2022-05-06 DIAGNOSIS — E1169 Type 2 diabetes mellitus with other specified complication: Secondary | ICD-10-CM | POA: Diagnosis not present

## 2022-05-06 DIAGNOSIS — E1159 Type 2 diabetes mellitus with other circulatory complications: Secondary | ICD-10-CM

## 2022-05-06 DIAGNOSIS — E785 Hyperlipidemia, unspecified: Secondary | ICD-10-CM | POA: Diagnosis not present

## 2022-05-06 DIAGNOSIS — E1122 Type 2 diabetes mellitus with diabetic chronic kidney disease: Secondary | ICD-10-CM

## 2022-05-06 DIAGNOSIS — Z125 Encounter for screening for malignant neoplasm of prostate: Secondary | ICD-10-CM

## 2022-05-06 DIAGNOSIS — I152 Hypertension secondary to endocrine disorders: Secondary | ICD-10-CM | POA: Diagnosis not present

## 2022-05-06 DIAGNOSIS — N184 Chronic kidney disease, stage 4 (severe): Secondary | ICD-10-CM | POA: Diagnosis not present

## 2022-05-06 DIAGNOSIS — Z794 Long term (current) use of insulin: Secondary | ICD-10-CM

## 2022-05-06 DIAGNOSIS — Z0001 Encounter for general adult medical examination with abnormal findings: Secondary | ICD-10-CM

## 2022-05-06 DIAGNOSIS — Z0000 Encounter for general adult medical examination without abnormal findings: Secondary | ICD-10-CM

## 2022-05-06 DIAGNOSIS — Z1211 Encounter for screening for malignant neoplasm of colon: Secondary | ICD-10-CM

## 2022-05-06 DIAGNOSIS — H9191 Unspecified hearing loss, right ear: Secondary | ICD-10-CM | POA: Diagnosis not present

## 2022-05-06 NOTE — Progress Notes (Signed)
Anthony French is a 67 y.o. male who is accompanied today by his domestic partner, Anthony French.  He presents to office today for annual physical exam examination.    Concerns today include: 1.  Unilateral hearing loss Patient continues to suffer from unilateral hearing loss on the right.  He is ready to see the ear nose and throat/audiologist for further evaluation.  Denies any dizziness, balance issues, trauma to the ear.  No exudative processes.  No fevers  Occupation: retired, Marital status: has a significant other, Substance use: none Diet: Tries to watch carbs because he is a diabetic, Exercise: No structured Last eye exam: UTD Last dental exam: Does not have teeth Refills needed today: None Immunizations needed: Immunization History  Administered Date(s) Administered   Fluad Quad(high Dose 65+) 11/02/2019, 12/27/2020   Influenza Inj Mdck Quad With Preservative 11/26/2015   Influenza,inj,Quad PF,6+ Mos 12/25/2016   Pneumococcal Conjugate-13 12/27/2018   Pneumococcal Polysaccharide-23 04/16/2017   Tdap 08/19/2017   Zoster Recombinat (Shingrix) 07/28/2019, 09/07/2020     Past Medical History:  Diagnosis Date   Diabetes ([place])    Diabetic neuropathy ([place])    Essential hypertension, benign 06/01/2019   Hyperlipidemia    Social History   Socioeconomic History   Marital status: Single    Spouse name: Not on file   Number of children: 3   Years of education: Not on file   Highest education level: Not on file  Occupational History   Occupation: retired  Tobacco Use   Smoking status: Never   Smokeless tobacco: Never  Vaping Use   Vaping Use: Never used  Substance and Sexual Activity   Alcohol use: Not Currently   Drug use: Never   Sexual activity: Not Currently  Other Topics Concern   Not on file  Social History Narrative   Not on file   Social Determinants of Health   Financial Resource Strain: Low Risk  (04/07/2022)   Overall Financial Resource Strain (CARDIA)     Difficulty of Paying Living Expenses: Not hard at all  Food Insecurity: No Food Insecurity (04/07/2022)   Hunger Vital Sign    Worried About Running Out of Food in the Last Year: Never true    Ran Out of Food in the Last Year: Never true  Transportation Needs: No Transportation Needs (04/07/2022)   PRAPARE - Hydrologist (Medical): No    Lack of Transportation (Non-Medical): No  Physical Activity: Insufficiently Active (04/07/2022)   Exercise Vital Sign    Days of Exercise per Week: 3 days    Minutes of Exercise per Session: 30 min  Stress: No Stress Concern Present (04/07/2022)   [place]    Feeling of Stress : Not at all  Social Connections: Moderately Isolated (04/07/2022)   Social Connection and Isolation Panel [NHANES]    Frequency of Communication with Friends and Family: More than three times a week    Frequency of Social Gatherings with Friends and Family: More than three times a week    Attends Religious Services: More than 4 times per year    Active Member of [employer] or Organizations: No    Attends Archivist Meetings: Never    Marital Status: Widowed  Intimate Partner Violence: Not At Risk (04/07/2022)   Humiliation, Afraid, Rape, and Kick questionnaire    Fear of Current or Ex-Partner: No    Emotionally Abused: No    Physically Abused: No  Sexually Abused: No   Past Surgical History:  Procedure Laterality Date   APPENDECTOMY  1980   CYST REMOVAL TRUNK     Family History  Problem Relation Age of Onset   Stroke Mother    Diabetes Mother    Hypertension Mother    Hyperlipidemia Mother    Kidney disease Mother    COPD Father    Diabetes Sister    Stroke Brother    Diabetes Brother    Arthritis Maternal Grandmother     Current Outpatient Medications:    amLODipine (NORVASC) 10 MG tablet, TAKE 1 TABLET BY MOUTH DAILY, Disp: 90 tablet, Rfl: 3   atorvastatin  (LIPITOR) 40 MG tablet, TAKE 1 TABLET BY MOUTH DAILY, Disp: 100 tablet, Rfl: 1   blood glucose meter kit and supplies, Dispense based on patient and insurance preference. Use up to four times daily as directed. (FOR ICD-10 E10.9, E11.9). Check blood sugar twice a day as directed., Disp: 1 each, Rfl: 0   chlorthalidone (HYGROTON) 25 MG tablet, Take 25 mg by mouth daily., Disp: , Rfl:    Continuous Blood Gluc Receiver (FREESTYLE LIBRE 2 READER) DEVI, Use to test blood sugars up to 6 times daily. DX: E11.9, Disp: 1 each, Rfl: 0   Continuous Blood Gluc Sensor (FREESTYLE LIBRE 2 SENSOR) MISC, Use to test blood sugars up to 6 times daily. DX: E11.9, Disp: 6 each, Rfl: 2   CVS D3 25 MCG (1000 UT) capsule, Take 1,000 Units by mouth daily., Disp: , Rfl:    Efinaconazole (JUBLIA) 10 % SOLN, Apply 1 application topically daily., Disp: 4 mL, Rfl: 5   gabapentin (NEURONTIN) 300 MG capsule, TAKE 1 CAPSULE BY MOUTH IN THE  MORNING AND TAKE 3 CAPSULES BY  MOUTH IN THE EVENING, Disp: 360 capsule, Rfl: 0   glucose blood (ACCU-CHEK GUIDE) test strip, Test blood sugar 4 times per day, before meals and at bedtime., Disp: 500 strip, Rfl: 3   insulin NPH-regular Human (70-30) 100 UNIT/ML injection, Inject 20 Units into the skin 2 (two) times daily before a meal., Disp: , Rfl:    Insulin Pen Needle (PEN NEEDLES) 31G X 8 MM MISC, 1 each by Does not apply route in the morning and at bedtime., Disp: 100 each, Rfl: 11   Insulin Syringe-Needle U-100 (BD INSULIN SYRINGE U/F) 31G X 5/16" 0.3 ML MISC, USE WITH INSULIN TWICE  DAILY DX E11.22, Disp: 200 each, Rfl: 3   levothyroxine (SYNTHROID) 50 MCG tablet, Take 1 tablet (50 mcg total) by mouth daily before breakfast., Disp: 100 tablet, Rfl: 2   sodium bicarbonate 650 MG tablet, Take 650 mg by mouth 2 (two) times daily., Disp: , Rfl:   Allergies  Allergen Reactions   Other Hives    Pt states that he is allergic to an ABT but he can not remember what it is   Sulfa Antibiotics  Hives     ROS: Review of Systems Pertinent items noted in HPI and remainder of comprehensive ROS otherwise negative.    Physical exam BP 136/74   Pulse (!) 58   Temp 97.8 F (36.6 C)   Ht 5\' 9"  (1.753 m)   Wt 214 lb (97.1 kg)   SpO2 97%   BMI 31.60 kg/m  General appearance: alert, cooperative, and appears stated age Head: Normocephalic, without obvious abnormality, atraumatic Eyes: negative findings: lids and lashes normal, conjunctivae and sclerae normal, corneas clear, and pupils equal, round, reactive to light and accomodation Ears: normal TM's and  external ear canals both ears Nose: Nares normal. Septum midline. Mucosa normal. No drainage or sinus tenderness. Throat:  Oropharynx without erythema or masses. Neck: no adenopathy, no carotid bruit, supple, symmetrical, trachea midline, and thyroid not enlarged, symmetric, no tenderness/mass/nodules Back: symmetric, no curvature. ROM normal. No CVA tenderness. Lungs: clear to auscultation bilaterally Chest wall: no tenderness Heart: regular rate and rhythm, S1, S2 normal, no murmur, click, rub or gallop Abdomen: soft, non-tender; bowel sounds normal; no masses,  no organomegaly Extremities: extremities normal, atraumatic, no cyanosis or edema Pulses: 2+ and symmetric Skin: Skin color, texture, turgor normal. No rashes or lesions Lymph nodes: Cervical, supraclavicular, and axillary nodes normal. Neurologic: Grossly normal except for decreased hearing on right    05/06/2022    2:03 PM 04/07/2022    1:46 PM 02/12/2022   10:20 AM  Depression screen PHQ 2/9  Decreased Interest 0 0 0  Down, Depressed, Hopeless 0 0 0  PHQ - 2 Score 0 0 0  Altered sleeping 0 0 0  Tired, decreased energy 0 0 0  Change in appetite 0 0 0  Feeling bad or failure about yourself  0 0 0  Trouble concentrating 0 0 0  Moving slowly or fidgety/restless 0 0 0  Suicidal thoughts 0 0 0  PHQ-9 Score 0 0 0  Difficult doing work/chores Not difficult at all Not  difficult at all Not difficult at all      05/06/2022    2:03 PM 02/12/2022   10:21 AM 09/09/2021   10:21 AM 03/12/2021   10:59 AM  GAD 7 : Generalized Anxiety Score  Nervous, Anxious, on Edge 0 0 0 0  Control/stop worrying 0 0 0 0  Worry too much - different things 0 0 0 0  Trouble relaxing 0 0 0 0  Restless 0 0 0 0  Easily annoyed or irritable 0 0 0 0  Afraid - awful might happen 0 0 0 0  Total GAD 7 Score 0 0 0 0  Anxiety Difficulty Not difficult at all Not difficult at all Not difficult at all Not difficult at all    Assessment/ Plan: Anthony French here for annual physical exam.   Annual physical exam  Type 2 diabetes mellitus with stage 4 chronic kidney disease, with long-term current use of insulin ([place])  Hypertension associated with diabetes ([place])  Hyperlipidemia associated with type 2 diabetes mellitus ([place]) - Plan: Lipid panel  Unilateral hearing loss, right - Plan: Ambulatory referral to ENT  Screening for malignant neoplasm of prostate - Plan: PSA  Colon cancer screening - Plan: Cologuard  A1c was 7.2 with home health.  Continue to follow-up with endocrinology and nephrology as directed.  Blood pressure controlled upon recheck.  No changes  Lipid panel ordered.  Will CC to specialists  Referral to ENT for unilateral hearing loss.  Anticipate possible need for referral to audiology.  Hopefully they can assist if this is needed  Check PSA.  Asymptomatic from a prostate standpoint  Cologuard ordered as he is due in June for repeat colon cancer screening.  Asymptomatic.  Counseled on healthy lifestyle choices, including diet (rich in fruits, vegetables and lean meats and low in salt and simple carbohydrates) and exercise (at least 30 minutes of moderate physical activity daily).  Morgan Keinath M. Lajuana Ripple, DO

## 2022-05-07 ENCOUNTER — Ambulatory Visit (INDEPENDENT_AMBULATORY_CARE_PROVIDER_SITE_OTHER): Payer: 59

## 2022-05-07 DIAGNOSIS — E1122 Type 2 diabetes mellitus with diabetic chronic kidney disease: Secondary | ICD-10-CM | POA: Diagnosis not present

## 2022-05-07 DIAGNOSIS — R809 Proteinuria, unspecified: Secondary | ICD-10-CM

## 2022-05-07 DIAGNOSIS — N189 Chronic kidney disease, unspecified: Secondary | ICD-10-CM | POA: Diagnosis not present

## 2022-05-07 DIAGNOSIS — I129 Hypertensive chronic kidney disease with stage 1 through stage 4 chronic kidney disease, or unspecified chronic kidney disease: Secondary | ICD-10-CM

## 2022-05-07 DIAGNOSIS — E875 Hyperkalemia: Secondary | ICD-10-CM

## 2022-05-07 DIAGNOSIS — N2581 Secondary hyperparathyroidism of renal origin: Secondary | ICD-10-CM

## 2022-05-07 DIAGNOSIS — E1129 Type 2 diabetes mellitus with other diabetic kidney complication: Secondary | ICD-10-CM

## 2022-05-07 DIAGNOSIS — N1832 Chronic kidney disease, stage 3b: Secondary | ICD-10-CM | POA: Diagnosis not present

## 2022-05-08 DIAGNOSIS — N184 Chronic kidney disease, stage 4 (severe): Secondary | ICD-10-CM | POA: Diagnosis not present

## 2022-05-22 DIAGNOSIS — N184 Chronic kidney disease, stage 4 (severe): Secondary | ICD-10-CM | POA: Diagnosis not present

## 2022-06-09 ENCOUNTER — Other Ambulatory Visit: Payer: 59

## 2022-06-09 ENCOUNTER — Telehealth: Payer: Self-pay | Admitting: Family Medicine

## 2022-06-09 DIAGNOSIS — I129 Hypertensive chronic kidney disease with stage 1 through stage 4 chronic kidney disease, or unspecified chronic kidney disease: Secondary | ICD-10-CM | POA: Diagnosis not present

## 2022-06-09 DIAGNOSIS — E211 Secondary hyperparathyroidism, not elsewhere classified: Secondary | ICD-10-CM | POA: Diagnosis not present

## 2022-06-09 DIAGNOSIS — E1169 Type 2 diabetes mellitus with other specified complication: Secondary | ICD-10-CM

## 2022-06-09 DIAGNOSIS — E1122 Type 2 diabetes mellitus with diabetic chronic kidney disease: Secondary | ICD-10-CM | POA: Diagnosis not present

## 2022-06-09 DIAGNOSIS — Z125 Encounter for screening for malignant neoplasm of prostate: Secondary | ICD-10-CM

## 2022-06-09 DIAGNOSIS — E875 Hyperkalemia: Secondary | ICD-10-CM | POA: Diagnosis not present

## 2022-06-09 DIAGNOSIS — N184 Chronic kidney disease, stage 4 (severe): Secondary | ICD-10-CM

## 2022-06-09 DIAGNOSIS — E039 Hypothyroidism, unspecified: Secondary | ICD-10-CM | POA: Diagnosis not present

## 2022-06-09 DIAGNOSIS — E785 Hyperlipidemia, unspecified: Secondary | ICD-10-CM | POA: Diagnosis not present

## 2022-06-09 DIAGNOSIS — N189 Chronic kidney disease, unspecified: Secondary | ICD-10-CM | POA: Diagnosis not present

## 2022-06-09 LAB — LIPID PANEL
Cholesterol, Total: 177 mg/dL (ref 100–199)
LDL Chol Calc (NIH): 111 mg/dL — ABNORMAL HIGH (ref 0–99)

## 2022-06-09 MED ORDER — FREESTYLE LIBRE 3 READER DEVI
1.0000 | Freq: Four times a day (QID) | 0 refills | Status: AC
Start: 2022-06-09 — End: ?

## 2022-06-09 MED ORDER — FREESTYLE LIBRE 3 SENSOR MISC
3 refills | Status: DC
Start: 2022-06-09 — End: 2022-10-20

## 2022-06-09 NOTE — Telephone Encounter (Signed)
Meds ordered this encounter  Medications   Continuous Glucose Sensor (FREESTYLE LIBRE 3 SENSOR) MISC    Sig: Place 1 sensor on the skin every 14 days. Use to check glucose continuously E11.9    Dispense:  6 each    Refill:  3   Continuous Glucose Receiver (FREESTYLE LIBRE 3 READER) DEVI    Sig: 1 each by Does not apply route in the morning, at noon, in the evening, and at bedtime. Use to check BGs E11.9    Dispense:  1 each    Refill:  0

## 2022-06-10 LAB — TSH: TSH: 2.4 u[IU]/mL (ref 0.450–4.500)

## 2022-06-10 LAB — LIPID PANEL
Chol/HDL Ratio: 5.9 ratio — ABNORMAL HIGH (ref 0.0–5.0)
HDL: 30 mg/dL — ABNORMAL LOW (ref >39)
Triglycerides: 206 mg/dL — ABNORMAL HIGH (ref 0–149)
VLDL Cholesterol Cal: 36 mg/dL (ref 5–40)

## 2022-06-10 LAB — T4, FREE: Free T4: 1.34 ng/dL (ref 0.82–1.77)

## 2022-06-10 LAB — PSA: Prostate Specific Ag, Serum: 3.7 ng/mL (ref 0.0–4.0)

## 2022-06-10 NOTE — Telephone Encounter (Signed)
Patient aware and verbalized understanding. °

## 2022-06-17 ENCOUNTER — Telehealth: Payer: Self-pay

## 2022-06-17 ENCOUNTER — Other Ambulatory Visit (HOSPITAL_COMMUNITY): Payer: Self-pay

## 2022-06-17 ENCOUNTER — Telehealth: Payer: Self-pay | Admitting: Family Medicine

## 2022-06-17 NOTE — Telephone Encounter (Signed)
Patient said he was told by his pharmacy that a PA needs to be started for Continuous Glucose Receiver (FREESTYLE LIBRE 3 READER) DEVI Continuous Glucose Sensor (FREESTYLE LIBRE 3 SENSOR) MISC  He said they told him for [place] to call [phone]

## 2022-06-17 NOTE — Telephone Encounter (Signed)
PA has been submitted, will be updated in additional encounter created.  

## 2022-06-17 NOTE — Telephone Encounter (Signed)
PA request received via provider for FreeStyle Libre 3 Sensor  PA has been submitted to [employer] Part D and is pending determination  Key: [id number]

## 2022-06-18 DIAGNOSIS — D631 Anemia in chronic kidney disease: Secondary | ICD-10-CM | POA: Diagnosis not present

## 2022-06-18 DIAGNOSIS — E875 Hyperkalemia: Secondary | ICD-10-CM | POA: Diagnosis not present

## 2022-06-18 DIAGNOSIS — N2581 Secondary hyperparathyroidism of renal origin: Secondary | ICD-10-CM | POA: Diagnosis not present

## 2022-06-25 DIAGNOSIS — E113413 Type 2 diabetes mellitus with severe nonproliferative diabetic retinopathy with macular edema, bilateral: Secondary | ICD-10-CM | POA: Diagnosis not present

## 2022-06-26 NOTE — Telephone Encounter (Signed)
PA has been APPROVED from 06/18/2022-02/10/2023

## 2022-07-18 ENCOUNTER — Other Ambulatory Visit: Payer: 59

## 2022-07-18 DIAGNOSIS — N189 Chronic kidney disease, unspecified: Secondary | ICD-10-CM | POA: Diagnosis not present

## 2022-07-18 DIAGNOSIS — N184 Chronic kidney disease, stage 4 (severe): Secondary | ICD-10-CM | POA: Diagnosis not present

## 2022-07-18 DIAGNOSIS — E8722 Chronic metabolic acidosis: Secondary | ICD-10-CM | POA: Diagnosis not present

## 2022-07-18 DIAGNOSIS — N2581 Secondary hyperparathyroidism of renal origin: Secondary | ICD-10-CM | POA: Diagnosis not present

## 2022-07-18 DIAGNOSIS — N179 Acute kidney failure, unspecified: Secondary | ICD-10-CM | POA: Diagnosis not present

## 2022-07-25 DIAGNOSIS — Z8679 Personal history of other diseases of the circulatory system: Secondary | ICD-10-CM | POA: Diagnosis not present

## 2022-07-25 DIAGNOSIS — N2581 Secondary hyperparathyroidism of renal origin: Secondary | ICD-10-CM | POA: Diagnosis not present

## 2022-07-25 DIAGNOSIS — R031 Nonspecific low blood-pressure reading: Secondary | ICD-10-CM | POA: Diagnosis not present

## 2022-07-30 ENCOUNTER — Encounter: Payer: 59 | Admitting: Vascular Surgery

## 2022-08-04 ENCOUNTER — Telehealth: Payer: Self-pay

## 2022-08-04 NOTE — Progress Notes (Signed)
   Care Guide Note  08/04/2022 Name: Doc Mandala MRN: [id number] DOB: 16-Feb-1955  Referred by: [name], DO Reason for referral : Care Coordination (Outreach to schedule with Pharm d )   Anthony French is a 67 y.o. year old male who is a primary care patient of [name], DO. Anthony French was referred to the pharmacist for assistance related to DM.    Successful contact was made with the patient to discuss pharmacy services including being ready for the pharmacist to call at least 5 minutes before the scheduled appointment time, to have medication bottles and any blood sugar or blood pressure readings ready for review. The patient agreed to meet with the pharmacist via with the pharmacist via telephone visit on (date/time).  08/22/2022  Penne Lash, RMA Care Guide [facility]  [place], [place] [address] Direct Dial: [phone] [name]

## 2022-08-20 ENCOUNTER — Encounter: Payer: Self-pay | Admitting: Nurse Practitioner

## 2022-08-20 ENCOUNTER — Ambulatory Visit (INDEPENDENT_AMBULATORY_CARE_PROVIDER_SITE_OTHER): Payer: 59 | Admitting: Nurse Practitioner

## 2022-08-20 VITALS — BP 136/78 | HR 59 | Ht 69.0 in | Wt 204.0 lb

## 2022-08-20 DIAGNOSIS — I10 Essential (primary) hypertension: Secondary | ICD-10-CM

## 2022-08-20 DIAGNOSIS — N185 Chronic kidney disease, stage 5: Secondary | ICD-10-CM

## 2022-08-20 DIAGNOSIS — E559 Vitamin D deficiency, unspecified: Secondary | ICD-10-CM

## 2022-08-20 DIAGNOSIS — E039 Hypothyroidism, unspecified: Secondary | ICD-10-CM | POA: Diagnosis not present

## 2022-08-20 DIAGNOSIS — E1122 Type 2 diabetes mellitus with diabetic chronic kidney disease: Secondary | ICD-10-CM

## 2022-08-20 DIAGNOSIS — Z794 Long term (current) use of insulin: Secondary | ICD-10-CM | POA: Diagnosis not present

## 2022-08-20 LAB — POCT GLYCOSYLATED HEMOGLOBIN (HGB A1C): Hemoglobin A1C: 6.9 % — AB (ref 4.0–5.6)

## 2022-08-20 NOTE — Progress Notes (Signed)
08/20/2022, 3:10 PM  Endocrinology follow-up note   Subjective:    Patient ID: Anthony French, male    DOB: 1955-04-01.  Anthony French is being seen in follow-up after he was seen in consultation for management of currently uncontrolled symptomatic diabetes requested by  [name], DO.   Past Medical History:  Diagnosis Date   Diabetes (HCC)    Diabetic neuropathy (HCC)    Essential hypertension, benign 06/01/2019   Hyperlipidemia     Past Surgical History:  Procedure Laterality Date   APPENDECTOMY  1980   CYST REMOVAL TRUNK      Social History   Socioeconomic History   Marital status: Single    Spouse name: Not on file   Number of children: 3   Years of education: Not on file   Highest education level: Not on file  Occupational History   Occupation: retired  Tobacco Use   Smoking status: Never   Smokeless tobacco: Never  Vaping Use   Vaping Use: Never used  Substance and Sexual Activity   Alcohol use: Not Currently   Drug use: Never   Sexual activity: Not Currently  Other Topics Concern   Not on file  Social History Narrative   Not on file   Social Determinants of Health   Financial Resource Strain: Low Risk  (04/07/2022)   Overall Financial Resource Strain (CARDIA)    Difficulty of Paying Living Expenses: Not hard at all  Food Insecurity: No Food Insecurity (04/07/2022)   Hunger Vital Sign    Worried About Running Out of Food in the Last Year: Never true    Ran Out of Food in the Last Year: Never true  Transportation Needs: No Transportation Needs (04/07/2022)   PRAPARE - Administrator, Civil Service (Medical): No    Lack of Transportation (Non-Medical): No  Physical Activity: Insufficiently Active (04/07/2022)   Exercise Vital Sign    Days of Exercise per Week: 3 days    Minutes of Exercise per Session: 30 min  Stress: No Stress Concern Present (04/07/2022)   [employer] of Occupational Health -  Occupational Stress Questionnaire    Feeling of Stress : Not at all  Social Connections: Moderately Isolated (04/07/2022)   Social Connection and Isolation Panel [NHANES]    Frequency of Communication with Friends and Family: More than three times a week    Frequency of Social Gatherings with Friends and Family: More than three times a week    Attends Religious Services: More than 4 times per year    Active Member of [employer] or Organizations: No    Attends Banker Meetings: Never    Marital Status: Widowed    Family History  Problem Relation Age of Onset   Stroke Mother    Diabetes Mother    Hypertension Mother    Hyperlipidemia Mother    Kidney disease Mother    COPD Father    Diabetes Sister    Stroke Brother    Diabetes Brother    Arthritis Maternal Grandmother     Outpatient Encounter Medications as of 08/20/2022  Medication Sig   amLODipine (NORVASC) 10 MG tablet TAKE 1 TABLET BY MOUTH DAILY   atorvastatin (LIPITOR) 40 MG tablet TAKE 1 TABLET BY MOUTH DAILY   blood glucose meter kit and supplies Dispense based on patient and insurance preference. Use up to four times daily as directed. (FOR ICD-10 E10.9, E11.9). Check blood sugar twice  a day as directed.   calcitRIOL (ROCALTROL) 0.25 MCG capsule Take 0.25 mcg by mouth 3 (three) times a week.   chlorthalidone (HYGROTON) 25 MG tablet Take 25 mg by mouth daily.   Continuous Glucose Receiver (FREESTYLE LIBRE 3 READER) DEVI 1 each by Does not apply route in the morning, at noon, in the evening, and at bedtime. Use to check BGs E11.9   Continuous Glucose Sensor (FREESTYLE LIBRE 3 SENSOR) MISC Place 1 sensor on the skin every 14 days. Use to check glucose continuously E11.9   [employer] D3 25 MCG (1000 UT) capsule Take 1,000 Units by mouth daily.   gabapentin (NEURONTIN) 300 MG capsule TAKE 1 CAPSULE BY MOUTH IN THE  MORNING AND TAKE 3 CAPSULES BY  MOUTH IN THE EVENING   glucose blood (ACCU-CHEK GUIDE) test strip Test blood  sugar 4 times per day, before meals and at bedtime.   insulin NPH-regular Human (70-30) 100 UNIT/ML injection Inject 20 Units into the skin 2 (two) times daily before a meal.   Insulin Pen Needle (PEN NEEDLES) 31G X 8 MM MISC 1 each by Does not apply route in the morning and at bedtime.   Insulin Syringe-Needle U-100 (BD INSULIN SYRINGE U/F) 31G X 5/16" 0.3 ML MISC USE WITH INSULIN TWICE  DAILY DX E11.22   levothyroxine (SYNTHROID) 50 MCG tablet Take 1 tablet (50 mcg total) by mouth daily before breakfast.   sodium bicarbonate 650 MG tablet Take 650 mg by mouth 2 (two) times daily.   [DISCONTINUED] Efinaconazole (JUBLIA) 10 % SOLN Apply 1 application topically daily. (Patient not taking: Reported on 08/20/2022)   No facility-administered encounter medications on file as of 08/20/2022.    ALLERGIES: Allergies  Allergen Reactions   Other Hives    Pt states that he is allergic to an ABT but he can not remember what it is   Sulfa Antibiotics Hives    VACCINATION STATUS: Immunization History  Administered Date(s) Administered   Fluad Quad(high Dose 65+) 11/02/2019, 12/27/2020   Influenza Inj Mdck Quad With Preservative 11/26/2015   Influenza,inj,Quad PF,6+ Mos 12/25/2016   Pneumococcal Conjugate-13 12/27/2018   Pneumococcal Polysaccharide-23 04/16/2017   Tdap 08/19/2017   Zoster Recombinant(Shingrix) 07/28/2019, 09/07/2020    Diabetes He presents for his follow-up diabetic visit. He has type 2 diabetes mellitus. Onset time: He was diagnosed at approximate age of 48 years. His disease course has been improving. There are no hypoglycemic associated symptoms. Pertinent negatives for hypoglycemia include no headaches, nervousness/anxiousness, pallor or tremors. Pertinent negatives for diabetes include no chest pain, no fatigue, no polydipsia, no polyphagia, no polyuria and no weight loss. There are no hypoglycemic complications. Symptoms are stable. Diabetic complications include nephropathy,  peripheral neuropathy and retinopathy. Risk factors for coronary artery disease include dyslipidemia, diabetes mellitus, hypertension, male sex, sedentary lifestyle and family history. Current diabetic treatment includes insulin injections. He is compliant with treatment most of the time. His weight is decreasing steadily. He is following a generally healthy diet. When asked about meal planning, he reported none. He has had a previous visit with a dietitian. He never participates in exercise. His home blood glucose trend is decreasing steadily. His overall blood glucose range is 180-200 mg/dl. (He presents today with his CGM and logs showing fluctuating yes improved glycemic profile overall.  His POCT A1c today is 6.9%, improving from last visit of 7.2%.  He denies any significant hypoglycemia.  Analysis of his CGM shows TIR 42%, TAR 57%, TBR 1) with a GMI of  8%. ) An ACE inhibitor/angiotensin II receptor blocker is being taken. He does not see a podiatrist.Eye exam is current.  Hyperlipidemia This is a chronic problem. The current episode started more than 1 year ago. The problem is controlled. Recent lipid tests were reviewed and are normal. Exacerbating diseases include chronic renal disease, diabetes and hypothyroidism. There are no known factors aggravating his hyperlipidemia. Pertinent negatives include no chest pain, myalgias or shortness of breath. Current antihyperlipidemic treatment includes statins. The current treatment provides significant improvement of lipids. There are no compliance problems.  Risk factors for coronary artery disease include diabetes mellitus, dyslipidemia, hypertension, male sex and a sedentary lifestyle.  Hypertension This is a chronic problem. The current episode started more than 1 year ago. The problem has been gradually improving since onset. The problem is controlled. Pertinent negatives include no chest pain, headaches, palpitations or shortness of breath. There are no  associated agents to hypertension. Risk factors for coronary artery disease include dyslipidemia, diabetes mellitus, family history, male gender and sedentary lifestyle. Past treatments include calcium channel blockers and angiotensin blockers. The current treatment provides mild improvement. There are no compliance problems.  Hypertensive end-organ damage includes kidney disease and retinopathy. Identifiable causes of hypertension include chronic renal disease and a thyroid problem.  Thyroid Problem Presents for follow-up (Abnormality noted with recent thyroid studies.  No prior work-up done, no previous labs to compare to.) visit. Onset time: Unknown. Patient reports no anxiety, cold intolerance, constipation, diarrhea, fatigue, heat intolerance, palpitations, tremors, weight gain or weight loss. The symptoms have been stable. Past treatments include nothing. His past medical history is significant for diabetes and hyperlipidemia. There are no known risk factors.    Review of systems  Constitutional: + Minimally fluctuating body weight,  current Body mass index is 30.13 kg/m. , no fatigue, no subjective hyperthermia, no subjective hypothermia Eyes: no blurry vision, no xerophthalmia ENT: no sore throat, no nodules palpated in throat, no dysphagia/odynophagia, no hoarseness Cardiovascular: no chest pain, no shortness of breath, no palpitations, no leg swelling Respiratory: no cough, no shortness of breath Gastrointestinal: no nausea/vomiting/diarrhea Musculoskeletal: no muscle/joint aches Skin: no rashes, no hyperemia Neurological: no tremors, no numbness, no tingling, no dizziness Psychiatric: no depression, no anxiety   Objective:    BP 136/78 (BP Location: Right Arm, Patient Position: Sitting, Cuff Size: Large)   Pulse (!) 59   Ht 5\' 9"  (1.753 m)   Wt 204 lb (92.5 kg)   BMI 30.13 kg/m   Wt Readings from Last 3 Encounters:  08/20/22 204 lb (92.5 kg)  05/06/22 214 lb (97.1 kg)   05/05/22 212 lb 9.6 oz (96.4 kg)     BP Readings from Last 3 Encounters:  08/20/22 136/78  05/06/22 136/74  05/05/22 130/76      Physical Exam- Limited  Constitutional:  Body mass index is 30.13 kg/m. , not in acute distress, normal state of mind Eyes:  EOMI, no exophthalmos Musculoskeletal: no gross deformities, strength intact in all four extremities, no gross restriction of joint movements Skin:  no rashes, no hyperemia Neurological: no tremor with outstretched hands  Diabetic Foot Exam - Simple   No data filed     CMP ( most recent) CMP     Component Value Date/Time   NA 145 (H) 08/15/2021 1018   K 5.6 (H) 08/15/2021 1018   CL 114 (H) 08/15/2021 1018   CO2 18 (L) 08/15/2021 1018   GLUCOSE 150 (H) 08/15/2021 1018   GLUCOSE 232 (H) 09/19/2020  1412   BUN 62 (H) 08/15/2021 1018   CREATININE 3.22 (H) 08/15/2021 1018   CALCIUM 8.9 08/15/2021 1018   PROT 6.1 08/15/2021 1018   ALBUMIN 4.1 08/15/2021 1018   AST 13 08/15/2021 1018   ALT 13 08/15/2021 1018   ALKPHOS 82 08/15/2021 1018   BILITOT 0.3 08/15/2021 1018   GFRNONAA 18 04/23/2022 1536   GFRAA 47 (L) 09/13/2019 1420    Diabetic Labs (most recent): Lab Results  Component Value Date   HGBA1C 6.9 (A) 08/20/2022   HGBA1C 6.3 (A) 11/29/2021   HGBA1C 5.8 (A) 08/23/2021   MICROALBUR 150 08/20/2020   MICROALBUR 237.3 09/13/2019     Lipid Panel ( most recent) Lipid Panel     Component Value Date/Time   CHOL 177 06/09/2022 0948   TRIG 206 (H) 06/09/2022 0948   HDL 30 (L) 06/09/2022 0948   CHOLHDL 5.9 (H) 06/09/2022 0948   LDLCALC 111 (H) 06/09/2022 0948   LABVLDL 36 06/09/2022 0948     Assessment & Plan:   1) Type 2 diabetes mellitus with stage 3b chronic kidney disease, with long-term current use of insulin (HCC)  - Anthony French has currently uncontrolled symptomatic type 2 DM since  67 years of age.   Recent labs reviewed.  He presents today with his CGM and logs showing fluctuating yes improved  glycemic profile overall.  His POCT A1c today is 6.9%, improving from last visit of 7.2%.  He denies any significant hypoglycemia.  Analysis of his CGM shows TIR 42%, TAR 57%, TBR 1) with a GMI of 8%.   - I had a long discussion with him about the progressive nature of diabetes and the pathology behind its complications. -his diabetes is complicated by CKD, peripheral neuropathy and he remains at a high risk for more acute and chronic complications which include CAD, CVA, CKD, retinopathy, and neuropathy. These are all discussed in detail with him.  - Nutritional counseling repeated at each appointment due to patients tendency to fall back in to old habits.  - The patient admits there is a room for improvement in their diet and drink choices. -  Suggestion is made for the patient to avoid simple carbohydrates from their diet including Cakes, Sweet Desserts / Pastries, Ice Cream, Soda (diet and regular), Sweet Tea, Candies, Chips, Cookies, Sweet Pastries, Store Bought Juices, Alcohol in Excess of 1-2 drinks a day, Artificial Sweeteners, Coffee Creamer, and "Sugar-free" Products. This will help patient to have stable blood glucose profile and potentially avoid unintended weight gain.   - I encouraged the patient to switch to unprocessed or minimally processed complex starch and increased protein intake (animal or plant source), fruits, and vegetables.   - Patient is advised to stick to a routine mealtimes to eat 3 meals a day and avoid unnecessary snacks (to snack only to correct hypoglycemia).  - I have approached him with the following individualized plan to manage  his diabetes and patient agrees:   - he will continue to benefit from simplicity of premixed insulin.   -Given his stable, at goal glycemic profile and lack of hypoglycemia, no changes will be made to his medications today.  He is advised to continue his Novolin 70/30 15 units with breakfast and 10 units with supper if glucose is above  90 and he is eating.   -He is encouraged to continue using his CGM to monitor blood glucose 4 times per day, before meals and before bed, and call the clinic if he  has readings less than 70 or greater than 300 for 3 tests in a row.  - Specific targets for  A1c;  LDL, HDL,  and Triglycerides were discussed with the patient.  2) Blood Pressure /Hypertension:  His blood pressure is controlled to target.  He is advised to continue Chlorthalidone 25 mg po daily as well as Amlodipine 10 mg po daily as needed.  3) Lipids/Hyperlipidemia:  His most recent lipid panel from 06/09/22 shows uncontrolled LDL of 111.  He is advised to continue Atorvastatin 40 mg po daily at bedtime.  Side effects and precautions discussed with him.    4)  Weight/Diet:  His Body mass index is 30.13 kg/m.     he is a candidate for some weight loss. I discussed with him the fact that loss of 5 - 10% of his  current body weight will have the most impact on his diabetes management.  Exercise, and detailed carbohydrates information provided  -  detailed on discharge instructions.  5)Hypothyroidism-acquired: His most recent TFTs from 06/09/22 shows appropriate hormone replacement.  He is advised to continue his Levothyroxine 50 mcg po daily before breakfast.    - The correct intake of thyroid hormone (Levothyroxine, Synthroid), is on empty stomach first thing in the morning, with water, separated by at least 30 minutes from breakfast and other medications,  and separated by more than 4 hours from calcium, iron, multivitamins, acid reflux medications (PPIs).  - This medication is a life-long medication and will be needed to correct thyroid hormone imbalances for the rest of your life.  The dose may change from time to time, based on thyroid blood work.  - It is extremely important to be consistent taking this medication, near the same time each morning.  -AVOID TAKING PRODUCTS CONTAINING BIOTIN (commonly found in Hair, Skin, Nails  vitamins) AS IT INTERFERES WITH THE VALIDITY OF THYROID FUNCTION BLOOD TESTS.  6) Chronic Care/Health Maintenance: -he is not on ARB (per nephrology) and is on Statin medications and is encouraged to initiate and continue to follow up with Ophthalmology, Dentist,  Podiatrist at least yearly or according to recommendations, and advised to stay away from smoking. I have recommended yearly flu vaccine and pneumonia vaccine at least every 5 years; moderate intensity exercise for up to 150 minutes weekly; and  sleep for at least 7 hours a day.  - he is advised to maintain close follow up with [name], DO for primary care needs, as well as his other providers for optimal and coordinated care.     I spent  36  minutes in the care of the patient today including review of labs from CMP, Lipids, Thyroid Function, Hematology (current and previous including abstractions from other facilities); face-to-face time discussing  his blood glucose readings/logs, discussing hypoglycemia and hyperglycemia episodes and symptoms, medications doses, his options of short and long term treatment based on the latest standards of care / guidelines;  discussion about incorporating lifestyle medicine;  and documenting the encounter. Risk reduction counseling performed per USPSTF guidelines to reduce obesity and cardiovascular risk factors.     Please refer to Patient Instructions for Blood Glucose Monitoring and Insulin/Medications Dosing Guide"  in media tab for additional information. Please  also refer to " Patient Self Inventory" in the Media  tab for reviewed elements of pertinent patient history.  Christel Mormon participated in the discussions, expressed understanding, and voiced agreement with the above plans.  All questions were answered to his satisfaction. he  is encouraged to contact clinic should he have any questions or concerns prior to his return visit.   Follow up plan: - Return in about 3 months (around  11/20/2022) for Diabetes F/U with A1c in office, No previsit labs, Bring meter and logs.  Ronny Bacon, [facility] [address] [place], [place] [address] Phone: [phone] Fax: [phone]   08/20/2022, 3:10 PM

## 2022-08-21 DIAGNOSIS — E113413 Type 2 diabetes mellitus with severe nonproliferative diabetic retinopathy with macular edema, bilateral: Secondary | ICD-10-CM | POA: Diagnosis not present

## 2022-08-22 ENCOUNTER — Telehealth: Payer: 59

## 2022-08-25 ENCOUNTER — Ambulatory Visit (INDEPENDENT_AMBULATORY_CARE_PROVIDER_SITE_OTHER): Payer: 59 | Admitting: Family Medicine

## 2022-08-25 ENCOUNTER — Other Ambulatory Visit: Payer: 59

## 2022-08-25 ENCOUNTER — Encounter: Payer: Self-pay | Admitting: Family Medicine

## 2022-08-25 ENCOUNTER — Ambulatory Visit (INDEPENDENT_AMBULATORY_CARE_PROVIDER_SITE_OTHER): Payer: 59

## 2022-08-25 VITALS — BP 134/76 | HR 60 | Temp 98.7°F | Ht 69.0 in | Wt 202.0 lb

## 2022-08-25 DIAGNOSIS — E785 Hyperlipidemia, unspecified: Secondary | ICD-10-CM | POA: Diagnosis not present

## 2022-08-25 DIAGNOSIS — G8929 Other chronic pain: Secondary | ICD-10-CM

## 2022-08-25 DIAGNOSIS — M545 Low back pain, unspecified: Secondary | ICD-10-CM | POA: Diagnosis not present

## 2022-08-25 DIAGNOSIS — M25561 Pain in right knee: Secondary | ICD-10-CM | POA: Diagnosis not present

## 2022-08-25 DIAGNOSIS — Z1211 Encounter for screening for malignant neoplasm of colon: Secondary | ICD-10-CM | POA: Diagnosis not present

## 2022-08-25 DIAGNOSIS — E1159 Type 2 diabetes mellitus with other circulatory complications: Secondary | ICD-10-CM

## 2022-08-25 DIAGNOSIS — E1122 Type 2 diabetes mellitus with diabetic chronic kidney disease: Secondary | ICD-10-CM | POA: Diagnosis not present

## 2022-08-25 DIAGNOSIS — E1169 Type 2 diabetes mellitus with other specified complication: Secondary | ICD-10-CM | POA: Diagnosis not present

## 2022-08-25 DIAGNOSIS — I152 Hypertension secondary to endocrine disorders: Secondary | ICD-10-CM

## 2022-08-25 DIAGNOSIS — N184 Chronic kidney disease, stage 4 (severe): Secondary | ICD-10-CM | POA: Diagnosis not present

## 2022-08-25 DIAGNOSIS — N2581 Secondary hyperparathyroidism of renal origin: Secondary | ICD-10-CM | POA: Diagnosis not present

## 2022-08-25 DIAGNOSIS — N185 Chronic kidney disease, stage 5: Secondary | ICD-10-CM

## 2022-08-25 DIAGNOSIS — Z794 Long term (current) use of insulin: Secondary | ICD-10-CM | POA: Diagnosis not present

## 2022-08-25 DIAGNOSIS — M419 Scoliosis, unspecified: Secondary | ICD-10-CM | POA: Diagnosis not present

## 2022-08-25 DIAGNOSIS — D631 Anemia in chronic kidney disease: Secondary | ICD-10-CM | POA: Diagnosis not present

## 2022-08-25 DIAGNOSIS — M5136 Other intervertebral disc degeneration, lumbar region: Secondary | ICD-10-CM | POA: Diagnosis not present

## 2022-08-25 DIAGNOSIS — N189 Chronic kidney disease, unspecified: Secondary | ICD-10-CM | POA: Diagnosis not present

## 2022-08-25 IMAGING — US US RENAL
1 series · 14 of 25 positions shown · non-contrast
Comparison: Renal ultrasound dated 04/22/2019.

CLINICAL DATA: Acute renal failure.

EXAM:
RENAL / URINARY TRACT ULTRASOUND COMPLETE

[Series 1: us renal · 14 of 43 slices shown]
[im 1/43]
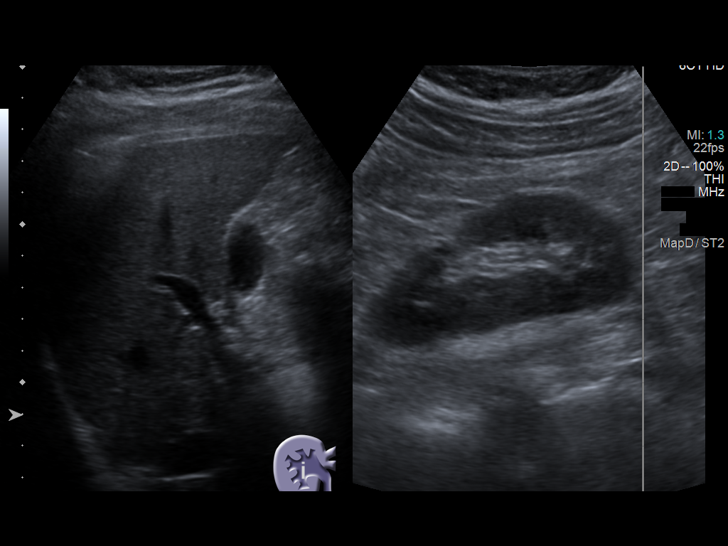
[im 4/43]
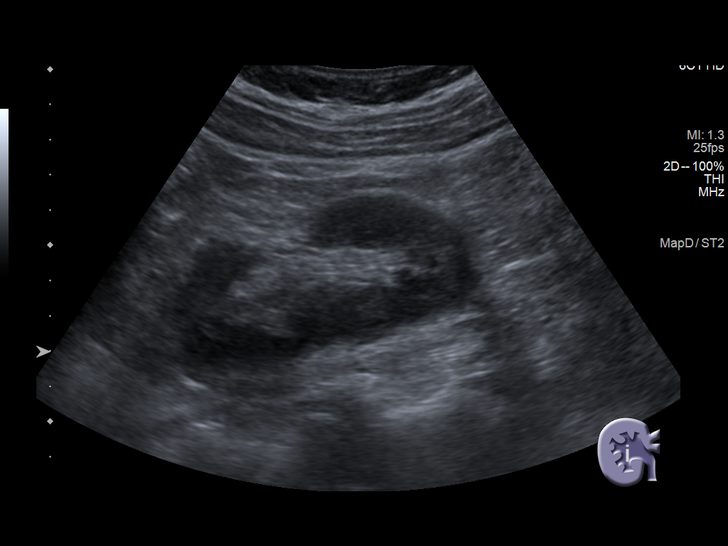
[im 8/43]
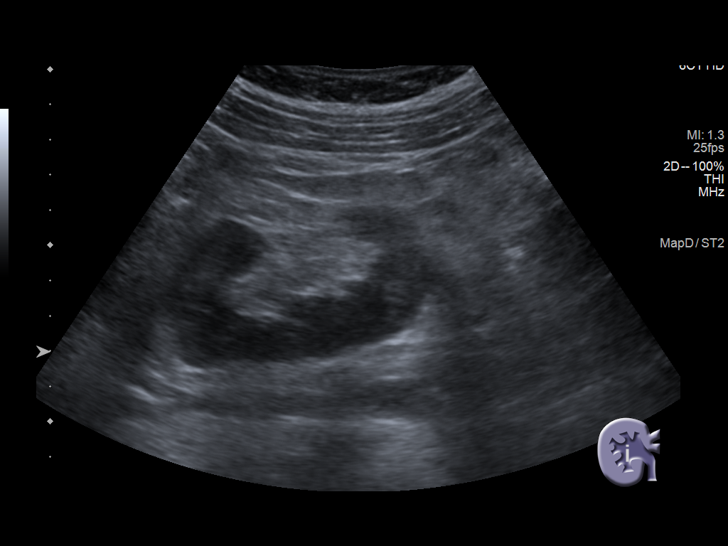
[im 11/43]
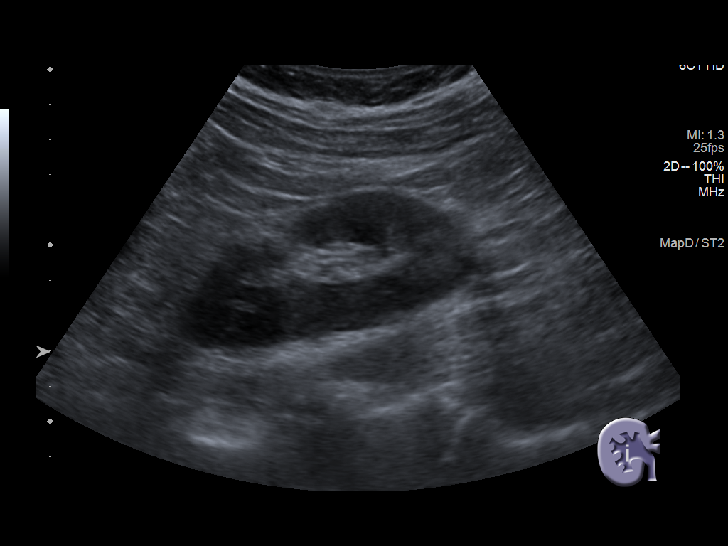
[im 15/43]
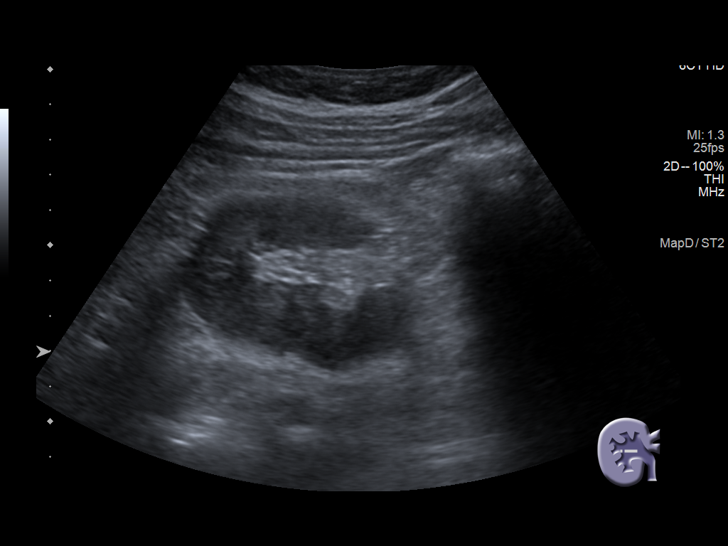
[im 16/43]
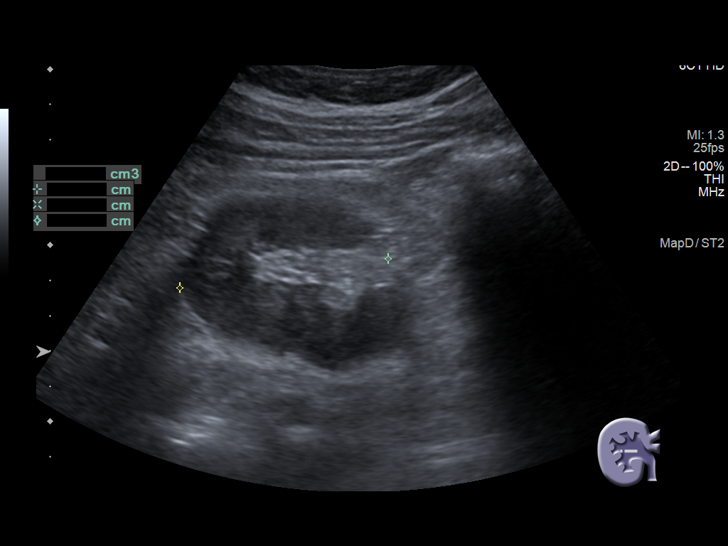
[im 20/43]
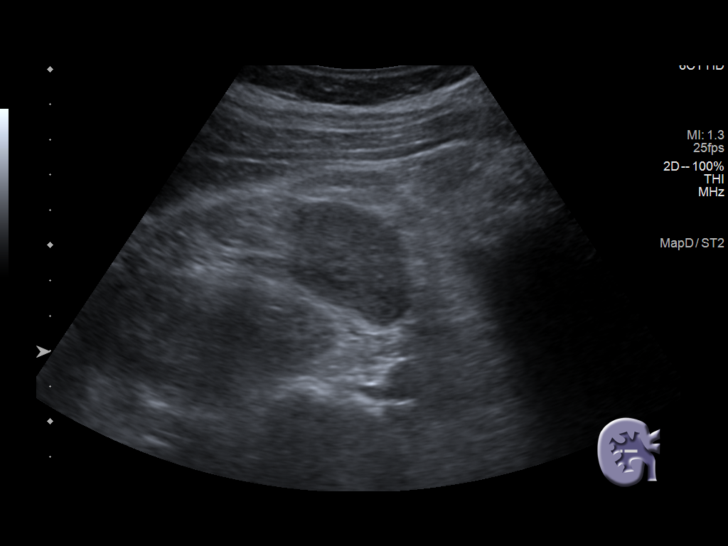
[im 23/43]
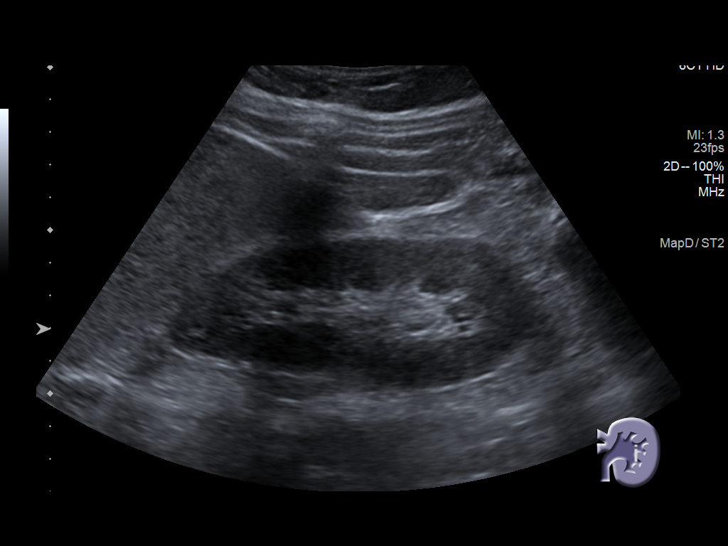
[im 27/43]
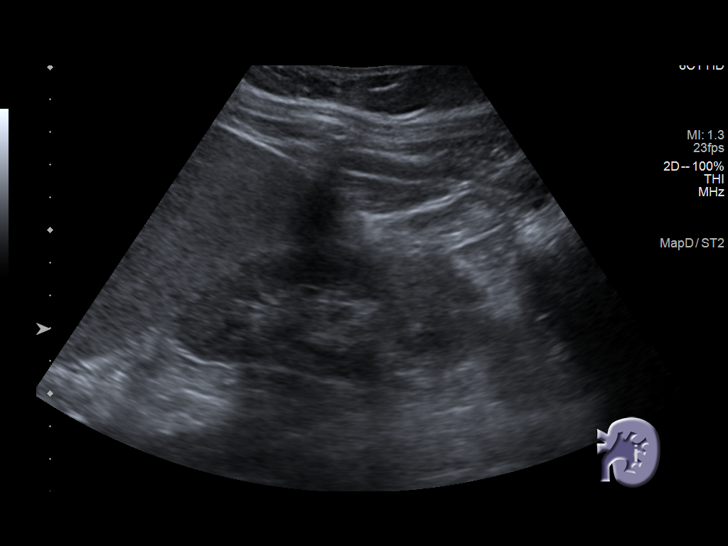
[im 29/43]
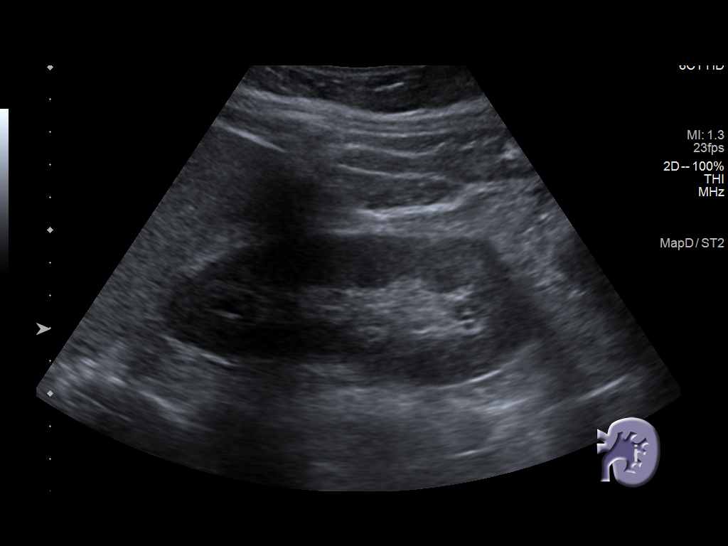
[im 32/43]
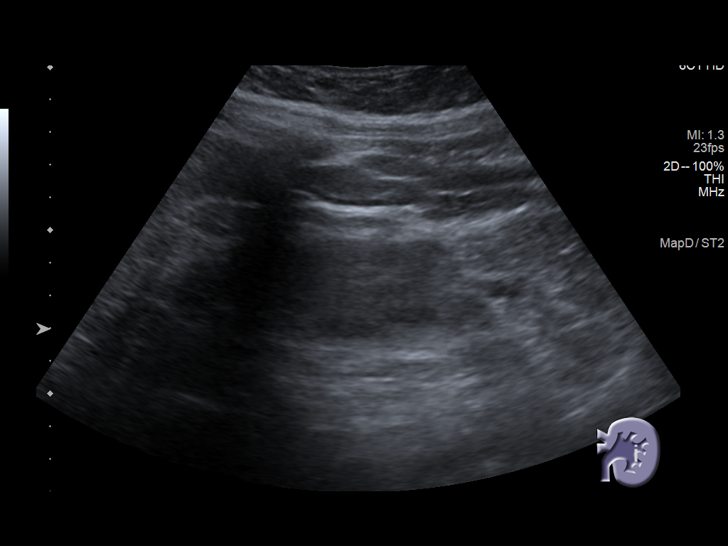
[im 36/43]
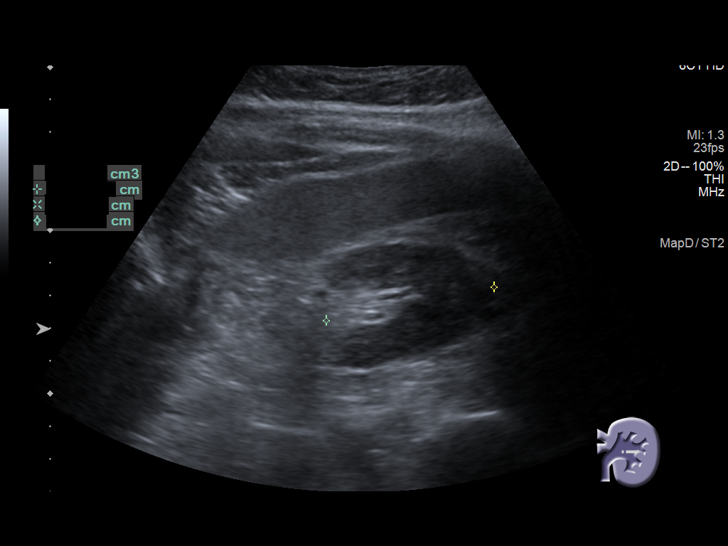
[im 39/43]
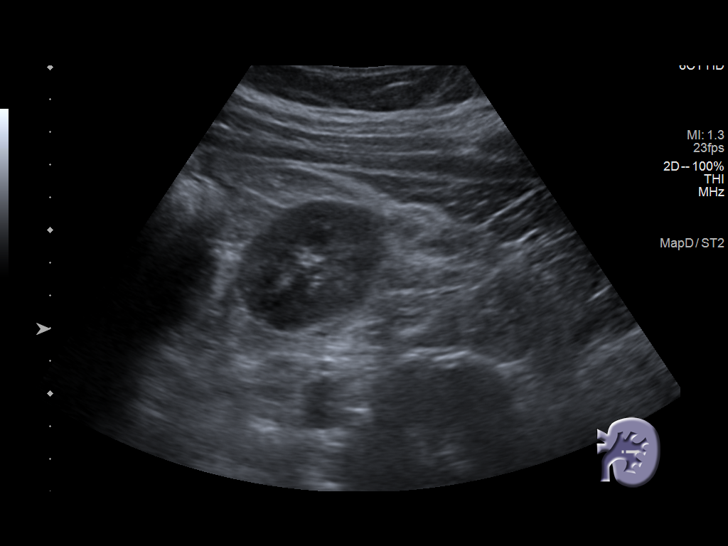
[im 43/43]
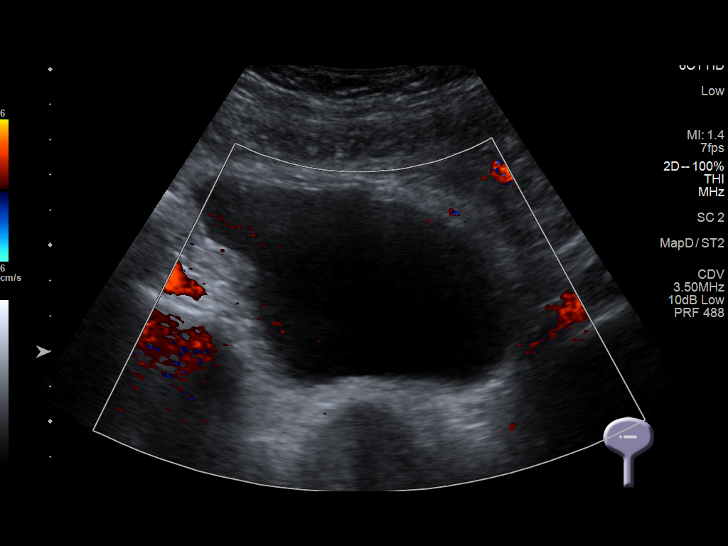

[14 of 25 positions shown; findings below may reference images not displayed]

FINDINGS: Right Kidney:

Renal measurements: 9.1 x 3.9 x 6.0 cm = volume: 111 mL.
Echogenicity within normal limits. No mass or hydronephrosis
visualized.

Left Kidney:

Renal measurements: 11.6 x 4.5 x 5.2 cm = volume: 141 mL.
Echogenicity within normal limits. No mass or hydronephrosis
visualized.

Bladder:

There is a 1.1 x 0.6 cm nodular density along the right bladder wall
which appears to have been present on the prior ultrasound. Although
this may represent an area of scarring a fold. A bladder lesion is
not excluded. Further evaluation with cystoscopy is recommended.

Other:

There is diffuse increased liver echogenicity most commonly seen in
the setting of fatty infiltration. Superimposed inflammation or
fibrosis is not excluded. Clinical correlation is recommended.
IMPRESSION: 1. Unremarkable kidneys.
2. Focal nodularity along the right bladder wall. Correlation with
cystoscopy is recommended.
3. Fatty liver.

## 2022-08-25 NOTE — Progress Notes (Signed)
Subjective: CC: Hypertension PCP: [name], DO ION:GEXBM Stein is a 67 y.o. male presenting to clinic today for:  1.  Hypertension associated with type 2 diabetes, CKD 5 He notes that blood pressures are relatively well-controlled at home and at his specialist visits.  Unfortunately his renal disease has progressed to CKD 5 and he is preparing for hemodialysis soon.  He is under the care of Dr. Wolfgang Phoenix for ongoing surveillance of renal function and Ronny Bacon for ongoing surveillance of type 2 diabetes.  He is currently treated with insulin, Lipitor, amlodipine, chlorthalidone and sodium bicarbonate.  He reports no chest pain, shortness of breath or visual disturbances outside of baseline.  He is status post eye injection bilaterally.  2.  Back pain Patient is a bit today by his spouse.  She notes that he has been having some increasing back pain when he does certain activities.  Recently he did some work where he was slightly bent and subsequently had some back pain.  He asked for Tylenol and he almost never asked for any type of pain medications.  He reports no falls.  He points to the lower back on the right side as a source of discomfort but is not actively having any currently.  He notes that this has happened similarly when he is done lawn work.  He also reports some right-sided knee pain that is chronic.  No soft tissue swelling or instability reported   ROS: Per HPI  Allergies  Allergen Reactions   Other Hives    Pt states that he is allergic to an ABT but he can not remember what it is   Sulfa Antibiotics Hives   Past Medical History:  Diagnosis Date   Diabetes (HCC)    Diabetic neuropathy (HCC)    Essential hypertension, benign 06/01/2019   Hyperlipidemia     Current Outpatient Medications:    amLODipine (NORVASC) 10 MG tablet, TAKE 1 TABLET BY MOUTH DAILY, Disp: 90 tablet, Rfl: 3   atorvastatin (LIPITOR) 40 MG tablet, TAKE 1 TABLET BY MOUTH DAILY, Disp: 100  tablet, Rfl: 1   blood glucose meter kit and supplies, Dispense based on patient and insurance preference. Use up to four times daily as directed. (FOR ICD-10 E10.9, E11.9). Check blood sugar twice a day as directed., Disp: 1 each, Rfl: 0   calcitRIOL (ROCALTROL) 0.25 MCG capsule, Take 0.25 mcg by mouth 3 (three) times a week., Disp: , Rfl:    chlorthalidone (HYGROTON) 25 MG tablet, Take 25 mg by mouth daily., Disp: , Rfl:    Continuous Glucose Receiver (FREESTYLE LIBRE 3 READER) DEVI, 1 each by Does not apply route in the morning, at noon, in the evening, and at bedtime. Use to check BGs E11.9, Disp: 1 each, Rfl: 0   Continuous Glucose Sensor (FREESTYLE LIBRE 3 SENSOR) MISC, Place 1 sensor on the skin every 14 days. Use to check glucose continuously E11.9, Disp: 6 each, Rfl: 3   CVS D3 25 MCG (1000 UT) capsule, Take 1,000 Units by mouth daily., Disp: , Rfl:    gabapentin (NEURONTIN) 300 MG capsule, TAKE 1 CAPSULE BY MOUTH IN THE  MORNING AND TAKE 3 CAPSULES BY  MOUTH IN THE EVENING, Disp: 360 capsule, Rfl: 0   glucose blood (ACCU-CHEK GUIDE) test strip, Test blood sugar 4 times per day, before meals and at bedtime., Disp: 500 strip, Rfl: 3   insulin NPH-regular Human (70-30) 100 UNIT/ML injection, Inject 20 Units into the skin 2 (two) times  daily before a meal., Disp: , Rfl:    Insulin Pen Needle (PEN NEEDLES) 31G X 8 MM MISC, 1 each by Does not apply route in the morning and at bedtime., Disp: 100 each, Rfl: 11   Insulin Syringe-Needle U-100 (BD INSULIN SYRINGE U/F) 31G X 5/16" 0.3 ML MISC, USE WITH INSULIN TWICE  DAILY DX E11.22, Disp: 200 each, Rfl: 3   levothyroxine (SYNTHROID) 50 MCG tablet, Take 1 tablet (50 mcg total) by mouth daily before breakfast., Disp: 100 tablet, Rfl: 2   sodium bicarbonate 650 MG tablet, Take 650 mg by mouth 2 (two) times daily., Disp: , Rfl:  Social History   Socioeconomic History   Marital status: Single    Spouse name: Not on file   Number of children: 3   Years  of education: Not on file   Highest education level: Not on file  Occupational History   Occupation: retired  Tobacco Use   Smoking status: Never   Smokeless tobacco: Never  Vaping Use   Vaping status: Never Used  Substance and Sexual Activity   Alcohol use: Not Currently   Drug use: Never   Sexual activity: Not Currently  Other Topics Concern   Not on file  Social History Narrative   Not on file   Social Determinants of Health   Financial Resource Strain: Low Risk  (04/07/2022)   Overall Financial Resource Strain (CARDIA)    Difficulty of Paying Living Expenses: Not hard at all  Food Insecurity: No Food Insecurity (04/07/2022)   Hunger Vital Sign    Worried About Running Out of Food in the Last Year: Never true    Ran Out of Food in the Last Year: Never true  Transportation Needs: No Transportation Needs (04/07/2022)   PRAPARE - Administrator, Civil Service (Medical): No    Lack of Transportation (Non-Medical): No  Physical Activity: Insufficiently Active (04/07/2022)   Exercise Vital Sign    Days of Exercise per Week: 3 days    Minutes of Exercise per Session: 30 min  Stress: No Stress Concern Present (04/07/2022)   [employer] of Occupational Health - Occupational Stress Questionnaire    Feeling of Stress : Not at all  Social Connections: Moderately Isolated (04/07/2022)   Social Connection and Isolation Panel [NHANES]    Frequency of Communication with Friends and Family: More than three times a week    Frequency of Social Gatherings with Friends and Family: More than three times a week    Attends Religious Services: More than 4 times per year    Active Member of [employer] or Organizations: No    Attends Banker Meetings: Never    Marital Status: Widowed  Intimate Partner Violence: Not At Risk (04/07/2022)   Humiliation, Afraid, Rape, and Kick questionnaire    Fear of Current or Ex-Partner: No    Emotionally Abused: No    Physically Abused: No     Sexually Abused: No   Family History  Problem Relation Age of Onset   Stroke Mother    Diabetes Mother    Hypertension Mother    Hyperlipidemia Mother    Kidney disease Mother    COPD Father    Diabetes Sister    Stroke Brother    Diabetes Brother    Arthritis Maternal Grandmother     Objective: Office vital signs reviewed. BP 134/76 Comment: home BP  Pulse 60   Temp 98.7 F (37.1 C)   Ht 5\' 9"  (1.753  m)   Wt 202 lb (91.6 kg)   SpO2 96%   BMI 29.83 kg/m   Physical Examination:  General: Awake, alert, well nourished, No acute distress HEENT: Sclera white on the right but on the left he has hemostatic bleed on the lateral aspect of the sclera.  Moist mucous membranes Cardio: regular rate and rhythm, S1S2 heard, no murmurs appreciated Pulm: clear to auscultation bilaterally, no wheezes, rhonchi or rales; normal work of breathing on room air MSK: Ambulating independently with normal gait and station.  No midline tenderness palpation to the spine  No results found.   Assessment/ Plan: 67 y.o. male   Hypertension associated with diabetes (HCC)  Hyperlipidemia associated with type 2 diabetes mellitus (HCC)  Type 2 diabetes mellitus with stage 5 chronic kidney disease not on chronic dialysis, with long-term current use of insulin (HCC)  Screening for colon cancer - Plan: Cologuard  Chronic pain of right knee - Plan: DG Knee 1-2 Views Right  Chronic right-sided low back pain without sciatica - Plan: DG Lumbar Spine 2-3 Views  Blood pressure was initially not at goal but home BPs are acceptable and I reviewed his most recent office note with endocrinology which showed a blood pressure controlled.  No changes for now but we did discuss that controlled blood pressure is essential in this renal disease.  Continue statin as currently prescribed  Sugar was at goal recently with A1c of 6.9.  Cologuard reordered  Plain films of lumbar spine and right knee collected  today.  I suspect osteoarthritic changes with associated spasm in the low back.  Okay to use Tylenol, topical analgesics, heat.  May consider referral if symptoms are ongoing or he develops any radicular symptoms  Orders Placed This Encounter  Procedures   DG Lumbar Spine 2-3 Views    Standing Status:   Future    Number of Occurrences:   1    Standing Expiration Date:   08/25/2023    Order Specific Question:   Reason for Exam (SYMPTOM  OR DIAGNOSIS REQUIRED)    Answer:   right knee pain    Order Specific Question:   Preferred imaging location?    Answer:   Internal   DG Knee 1-2 Views Right    Standing Status:   Future    Number of Occurrences:   1    Standing Expiration Date:   08/25/2023    Order Specific Question:   Reason for Exam (SYMPTOM  OR DIAGNOSIS REQUIRED)    Answer:   right knee pain    Order Specific Question:   Preferred imaging location?    Answer:   Internal   Cologuard   No orders of the defined types were placed in this encounter.    [name], DO [facility] [phone]

## 2022-09-09 ENCOUNTER — Ambulatory Visit (INDEPENDENT_AMBULATORY_CARE_PROVIDER_SITE_OTHER): Payer: 59 | Admitting: Family Medicine

## 2022-09-09 ENCOUNTER — Ambulatory Visit: Payer: 59 | Admitting: Family Medicine

## 2022-09-09 ENCOUNTER — Encounter: Payer: Self-pay | Admitting: Family Medicine

## 2022-09-09 VITALS — BP 116/69 | HR 55 | Temp 97.7°F | Ht 69.0 in | Wt 197.6 lb

## 2022-09-09 DIAGNOSIS — G4489 Other headache syndrome: Secondary | ICD-10-CM | POA: Diagnosis not present

## 2022-09-09 DIAGNOSIS — N19 Unspecified kidney failure: Secondary | ICD-10-CM | POA: Diagnosis not present

## 2022-09-09 MED ORDER — RIZATRIPTAN BENZOATE 10 MG PO TBDP: 10.0000 mg | ORAL_TABLET | ORAL | 0 refills | Status: DC | PRN

## 2022-09-09 MED ORDER — BETAMETHASONE SOD PHOS & ACET 6 (3-3) MG/ML IJ SUSP
6.0000 mg | Freq: Once | INTRAMUSCULAR | Status: AC
Start: 2022-09-09 — End: 2022-09-09
  Administered 2022-09-09: 6 mg via INTRAMUSCULAR

## 2022-09-09 NOTE — Progress Notes (Signed)
Subjective:  Patient ID: Anthony French, male    DOB: Apr 10, 1955  Age: 67 y.o. MRN: [id number]  CC: Headache (X 3 days)   HPI Anthony French presents for HA onset 7/27. Pulsating. Right parietal. Electrical shock. Ranges from 3-10/10. No vision changes. No nausea. Denies focal neurologic changes. Wife says he is irritable. First ever HA. No relief with tylenol. Sleeping, dark room helps. NKI for the last 6 weeks or more. Has stage 4-5 CRF, diabetes. Most recent BUN/Cr = 82/4.56, eGFR = 13     09/09/2022   10:45 AM 05/06/2022    2:03 PM 04/07/2022    1:46 PM  Depression screen PHQ 2/9  Decreased Interest 0 0 0  Down, Depressed, Hopeless 0 0 0  PHQ - 2 Score 0 0 0  Altered sleeping  0 0  Tired, decreased energy  0 0  Change in appetite  0 0  Feeling bad or failure about yourself   0 0  Trouble concentrating  0 0  Moving slowly or fidgety/restless  0 0  Suicidal thoughts  0 0  PHQ-9 Score  0 0  Difficult doing work/chores  Not difficult at all Not difficult at all    History Anthony French has a past medical history of Diabetes (HCC), Diabetic neuropathy (HCC), Essential hypertension, benign (06/01/2019), and Hyperlipidemia.   He has a past surgical history that includes Appendectomy (1980) and Cyst removal trunk.   His family history includes Arthritis in his maternal grandmother; COPD in his father; Diabetes in his brother, mother, and sister; Hyperlipidemia in his mother; Hypertension in his mother; Kidney disease in his mother; Stroke in his brother and mother.He reports that he has never smoked. He has never used smokeless tobacco. He reports that he does not currently use alcohol. He reports that he does not use drugs.    ROS Review of Systems  Constitutional:  Negative for fever.  Respiratory:  Negative for shortness of breath.   Cardiovascular:  Negative for chest pain.  Musculoskeletal:  Negative for arthralgias.  Skin:  Negative for rash.    Objective:  BP 116/69   Pulse (!)  55   Temp 97.7 F (36.5 C)   Ht 5\' 9"  (1.753 m)   Wt 197 lb 9.6 oz (89.6 kg)   SpO2 97%   BMI 29.18 kg/m   BP Readings from Last 3 Encounters:  09/09/22 116/69  08/25/22 134/76  08/20/22 136/78    Wt Readings from Last 3 Encounters:  09/09/22 197 lb 9.6 oz (89.6 kg)  08/25/22 202 lb (91.6 kg)  08/20/22 204 lb (92.5 kg)     Physical Exam Vitals reviewed.  Constitutional:      Appearance: He is well-developed.  HENT:     Head: Normocephalic and atraumatic.     Right Ear: External ear normal.     Left Ear: External ear normal.     Mouth/Throat:     Pharynx: No oropharyngeal exudate or posterior oropharyngeal erythema.  Eyes:     Pupils: Pupils are equal, round, and reactive to light.  Cardiovascular:     Rate and Rhythm: Normal rate and regular rhythm.     Heart sounds: No murmur heard. Pulmonary:     Effort: No respiratory distress.     Breath sounds: Normal breath sounds.  Musculoskeletal:     Cervical back: Normal range of motion and neck supple.  Neurological:     Mental Status: He is alert and oriented to person, place, and time.  Assessment & Plan:   Anthony French was seen today for headache.  Diagnoses and all orders for this visit:  Uremia -     BMP8+EGFR -     betamethasone acetate-betamethasone sodium phosphate (CELESTONE) injection 6 mg -     CBC with Differential/Platelet  Other headache syndrome -     BMP8+EGFR -     betamethasone acetate-betamethasone sodium phosphate (CELESTONE) injection 6 mg -     CBC with Differential/Platelet  Other orders -     rizatriptan (MAXALT-MLT) 10 MG disintegrating tablet; Take 1 tablet (10 mg total) by mouth as needed for migraine. May repeat in 2 hours if needed       I am having Anthony French start on rizatriptan. I am also having him maintain his blood glucose meter kit and supplies, Pen Needles, insulin NPH-regular Human, sodium bicarbonate, Insulin Syringe-Needle U-100, chlorthalidone, CVS D3,  amLODipine, atorvastatin, gabapentin, levothyroxine, Accu-Chek Guide, FreeStyle Libre 3 Sensor, [employer] 3 Reader, and calcitRIOL. We administered betamethasone acetate-betamethasone sodium phosphate.  Allergies as of 09/09/2022       Reactions   Other Hives   Pt states that he is allergic to an ABT but he can not remember what it is   Sulfa Antibiotics Hives        Medication List        Accurate as of September 09, 2022  5:45 PM. If you have any questions, ask your nurse or doctor.          Accu-Chek Guide test strip Generic drug: glucose blood Test blood sugar 4 times per day, before meals and at bedtime.   amLODipine 10 MG tablet Commonly known as: NORVASC TAKE 1 TABLET BY MOUTH DAILY   atorvastatin 40 MG tablet Commonly known as: LIPITOR TAKE 1 TABLET BY MOUTH DAILY   blood glucose meter kit and supplies Dispense based on patient and insurance preference. Use up to four times daily as directed. (FOR ICD-10 E10.9, E11.9). Check blood sugar twice a day as directed.   calcitRIOL 0.25 MCG capsule Commonly known as: ROCALTROL Take 0.25 mcg by mouth 3 (three) times a week.   chlorthalidone 25 MG tablet Commonly known as: HYGROTON Take 25 mg by mouth daily.   CVS D3 25 MCG (1000 UT) capsule Generic drug: Cholecalciferol Take 1,000 Units by mouth daily.   FreeStyle Libre 3 Reader Devi 1 each by Does not apply route in the morning, at noon, in the evening, and at bedtime. Use to check BGs E11.9   FreeStyle Libre 3 Sensor Misc Place 1 sensor on the skin every 14 days. Use to check glucose continuously E11.9   gabapentin 300 MG capsule Commonly known as: NEURONTIN TAKE 1 CAPSULE BY MOUTH IN THE  MORNING AND TAKE 3 CAPSULES BY  MOUTH IN THE EVENING   insulin NPH-regular Human (70-30) 100 UNIT/ML injection Inject 20 Units into the skin 2 (two) times daily before a meal.   Insulin Syringe-Needle U-100 31G X 5/16" 0.3 ML Misc Commonly known as: BD Insulin  Syringe U/F USE WITH INSULIN TWICE  DAILY DX E11.22   levothyroxine 50 MCG tablet Commonly known as: SYNTHROID Take 1 tablet (50 mcg total) by mouth daily before breakfast.   Pen Needles 31G X 8 MM Misc 1 each by Does not apply route in the morning and at bedtime.   rizatriptan 10 MG disintegrating tablet Commonly known as: Maxalt-MLT Take 1 tablet (10 mg total) by mouth as needed for migraine. May repeat in 2  hours if needed Started by: Alexx Giambra   sodium bicarbonate 650 MG tablet Take 650 mg by mouth 2 (two) times daily.         Follow-up: Return if symptoms worsen or fail to improve.  Mechele Claude, M.D.

## 2022-09-19 ENCOUNTER — Encounter: Payer: Self-pay | Admitting: Podiatry

## 2022-09-19 ENCOUNTER — Ambulatory Visit (INDEPENDENT_AMBULATORY_CARE_PROVIDER_SITE_OTHER): Payer: 59 | Admitting: Podiatry

## 2022-09-19 DIAGNOSIS — E1142 Type 2 diabetes mellitus with diabetic polyneuropathy: Secondary | ICD-10-CM

## 2022-09-19 DIAGNOSIS — M79609 Pain in unspecified limb: Secondary | ICD-10-CM

## 2022-09-19 DIAGNOSIS — B351 Tinea unguium: Secondary | ICD-10-CM

## 2022-09-19 NOTE — Progress Notes (Signed)
Subjective: Anthony French is a 67 y.o. male patient with history of diabetes who presents to office today with a chief concern of long,mildly painful nails  while ambulating in shoes; unable to trim.  Patient denies any new cramping, numbness, burning or tingling in the legs. His last HgA1C was 6.9 on 08/20/22 Patient denies any new changes in medication or new problems.  Referring Provider: [name], DO   Patient Active Problem List   Diagnosis Date Noted   Nodular basal cell carcinoma (BCC) 10/28/2021   Cough 03/14/2020   Nausea 03/14/2020   Sore throat 03/14/2020   Type 2 diabetes mellitus with stage 3b chronic kidney disease, with long-term current use of insulin (HCC) 06/01/2019   Mixed hyperlipidemia 06/01/2019   Essential hypertension, benign 06/01/2019   Thrombocytopenia (HCC) 05/11/2019   Diabetic retinopathy (HCC) 11/06/2017   Acute hypoxemic respiratory failure (HCC) 07/11/2017   Pneumonia of right lower lobe due to Haemophilus influenzae (HCC) 07/11/2017   Sepsis (HCC) 07/11/2017   CKD stage 3 due to type 2 diabetes mellitus (HCC) 06/17/2017   Actinic keratosis 04/16/2017   Diabetic polyneuropathy associated with type 2 diabetes mellitus (HCC) 03/13/2017   Foot callus 03/13/2017   Trigger middle finger of left hand 03/13/2017   Diabetes mellitus (HCC) 03/27/2015   Hyperlipidemia associated with type 2 diabetes mellitus (HCC) 03/25/2011   Dyslipidemia 03/25/2011   Current Outpatient Medications on File Prior to Visit  Medication Sig Dispense Refill   amLODipine (NORVASC) 10 MG tablet TAKE 1 TABLET BY MOUTH DAILY 90 tablet 3   atorvastatin (LIPITOR) 40 MG tablet TAKE 1 TABLET BY MOUTH DAILY 100 tablet 1   blood glucose meter kit and supplies Dispense based on patient and insurance preference. Use up to four times daily as directed. (FOR ICD-10 E10.9, E11.9). Check blood sugar twice a day as directed. 1 each 0   calcitRIOL (ROCALTROL) 0.25 MCG capsule Take 0.25 mcg  by mouth 3 (three) times a week.     chlorthalidone (HYGROTON) 25 MG tablet Take 25 mg by mouth daily.     Continuous Glucose Receiver (FREESTYLE LIBRE 3 READER) DEVI 1 each by Does not apply route in the morning, at noon, in the evening, and at bedtime. Use to check BGs E11.9 1 each 0   Continuous Glucose Sensor (FREESTYLE LIBRE 3 SENSOR) MISC Place 1 sensor on the skin every 14 days. Use to check glucose continuously E11.9 6 each 3   CVS D3 25 MCG (1000 UT) capsule Take 1,000 Units by mouth daily.     gabapentin (NEURONTIN) 300 MG capsule TAKE 1 CAPSULE BY MOUTH IN THE  MORNING AND TAKE 3 CAPSULES BY  MOUTH IN THE EVENING 360 capsule 0   glucose blood (ACCU-CHEK GUIDE) test strip Test blood sugar 4 times per day, before meals and at bedtime. 500 strip 3   insulin NPH-regular Human (70-30) 100 UNIT/ML injection Inject 20 Units into the skin 2 (two) times daily before a meal.     Insulin Pen Needle (PEN NEEDLES) 31G X 8 MM MISC 1 each by Does not apply route in the morning and at bedtime. 100 each 11   Insulin Syringe-Needle U-100 (BD INSULIN SYRINGE U/F) 31G X 5/16" 0.3 ML MISC USE WITH INSULIN TWICE  DAILY DX E11.22 200 each 3   levothyroxine (SYNTHROID) 50 MCG tablet Take 1 tablet (50 mcg total) by mouth daily before breakfast. 100 tablet 2   rizatriptan (MAXALT-MLT) 10 MG disintegrating tablet Take 1 tablet (10  mg total) by mouth as needed for migraine. May repeat in 2 hours if needed 10 tablet 0   sodium bicarbonate 650 MG tablet Take 650 mg by mouth 2 (two) times daily.     No current facility-administered medications on file prior to visit.   Allergies  Allergen Reactions   Other Hives    Pt states that he is allergic to an ABT but he can not remember what it is   Sulfa Antibiotics Hives      Objective: General: Patient is awake, alert, and oriented x 3 and in no acute distress.  Integument: Skin is warm, dry and supple bilateral. Nails are tender, long, thickened and  dystrophic  with subungual debris, consistent with onychomycosis 1 left. Remainder of nails are elongated. No signs of infection. No open lesions or preulcerative lesions present bilateral. Remaining integument unremarkable.  Vasculature:  Dorsalis Pedis pulse 2/4 bilateral. Posterior Tibial pulse  1/4 bilateral.  Capillary fill time <3 sec 1-5 bilateral. Temperature gradient within normal limits. no varicosities present bilateral. no edema present bilateral.   Neurology: The patient has intact sensation measured with a 5.07/10g Semmes Weinstein Monofilament at  5/5 pedal sites bilateral . Vibratory sensation diminished bilateral with tuning fork. No Babinski sign present bilateral.   Musculoskeletal: No symptomatic pedal deformities noted bilateral. Muscular strength 5/5 in all lower extremity muscular groups bilateral without pain on range of motion . No tenderness with calf compression bilateral.  Assessment and Plan:   ICD-10-CM   1. Pain due to onychomycosis of nail  B35.1    M79.609     2. Diabetic polyneuropathy associated with type 2 diabetes mellitus (HCC)  E11.42        -Examined patient. -Mechanically debrided all nails 1-5 bilateral using sterile nail nipper and filed with dremel without incident  -Answered all patient questions -Patient to return  in 3 months for at risk foot care -Patient advised to call the office if any problems or questions arise in the meantime.  Louann Sjogren, DPM

## 2022-09-29 DIAGNOSIS — E875 Hyperkalemia: Secondary | ICD-10-CM | POA: Diagnosis not present

## 2022-09-29 DIAGNOSIS — N184 Chronic kidney disease, stage 4 (severe): Secondary | ICD-10-CM | POA: Diagnosis not present

## 2022-09-29 DIAGNOSIS — D631 Anemia in chronic kidney disease: Secondary | ICD-10-CM | POA: Diagnosis not present

## 2022-09-29 DIAGNOSIS — E8722 Chronic metabolic acidosis: Secondary | ICD-10-CM | POA: Diagnosis not present

## 2022-10-16 DIAGNOSIS — E113413 Type 2 diabetes mellitus with severe nonproliferative diabetic retinopathy with macular edema, bilateral: Secondary | ICD-10-CM | POA: Diagnosis not present

## 2022-10-20 ENCOUNTER — Telehealth: Payer: Self-pay | Admitting: Family Medicine

## 2022-10-20 DIAGNOSIS — E1122 Type 2 diabetes mellitus with diabetic chronic kidney disease: Secondary | ICD-10-CM

## 2022-10-20 MED ORDER — FREESTYLE LIBRE 3 SENSOR MISC
0 refills | Status: DC
Start: 2022-10-20 — End: 2023-04-29

## 2022-10-20 NOTE — Telephone Encounter (Signed)
Pt needs Continuous Glucose Sensor (FREESTYLE LIBRE 3 SENSOR) MISC  to [facility] to [facility] just this one [facility] because mail order is out of stock.

## 2022-10-20 NOTE — Telephone Encounter (Signed)
LMOVM refill sent to [facility]

## 2022-10-30 ENCOUNTER — Ambulatory Visit (INDEPENDENT_AMBULATORY_CARE_PROVIDER_SITE_OTHER): Payer: 59

## 2022-10-30 DIAGNOSIS — N1832 Chronic kidney disease, stage 3b: Secondary | ICD-10-CM

## 2022-10-30 DIAGNOSIS — Z794 Long term (current) use of insulin: Secondary | ICD-10-CM

## 2022-10-30 DIAGNOSIS — E1122 Type 2 diabetes mellitus with diabetic chronic kidney disease: Secondary | ICD-10-CM

## 2022-10-30 LAB — HM DIABETES EYE EXAM

## 2022-10-30 NOTE — Progress Notes (Signed)
Anthony French arrived 10/30/2022 and has given verbal consent to obtain images and complete their overdue diabetic retinal screening.  The images have been sent to an ophthalmologist or optometrist for review and interpretation.  Results will be sent back to [name], DO for review.  Patient has been informed they will be contacted when we receive the results via telephone or MyChart

## 2022-11-05 NOTE — Progress Notes (Signed)
Eye exam was unreadable.

## 2022-11-17 ENCOUNTER — Other Ambulatory Visit: Payer: Self-pay | Admitting: *Deleted

## 2022-11-17 DIAGNOSIS — N183 Chronic kidney disease, stage 3 unspecified: Secondary | ICD-10-CM

## 2022-11-19 ENCOUNTER — Encounter: Payer: Self-pay | Admitting: Family Medicine

## 2022-11-19 ENCOUNTER — Ambulatory Visit: Payer: 59 | Admitting: Family Medicine

## 2022-11-19 VITALS — BP 127/69 | HR 67 | Temp 98.8°F | Ht 69.0 in

## 2022-11-19 DIAGNOSIS — E1169 Type 2 diabetes mellitus with other specified complication: Secondary | ICD-10-CM | POA: Diagnosis not present

## 2022-11-19 DIAGNOSIS — I152 Hypertension secondary to endocrine disorders: Secondary | ICD-10-CM | POA: Diagnosis not present

## 2022-11-19 DIAGNOSIS — Z23 Encounter for immunization: Secondary | ICD-10-CM | POA: Diagnosis not present

## 2022-11-19 DIAGNOSIS — Z794 Long term (current) use of insulin: Secondary | ICD-10-CM | POA: Diagnosis not present

## 2022-11-19 DIAGNOSIS — Z1211 Encounter for screening for malignant neoplasm of colon: Secondary | ICD-10-CM

## 2022-11-19 DIAGNOSIS — N184 Chronic kidney disease, stage 4 (severe): Secondary | ICD-10-CM | POA: Diagnosis not present

## 2022-11-19 DIAGNOSIS — E1122 Type 2 diabetes mellitus with diabetic chronic kidney disease: Secondary | ICD-10-CM

## 2022-11-19 DIAGNOSIS — E1159 Type 2 diabetes mellitus with other circulatory complications: Secondary | ICD-10-CM

## 2022-11-19 NOTE — Progress Notes (Addendum)
Subjective: CC:HTN PCP: [name], DO NWG:NFAOZ Marquard is a 67 y.o. male presenting to clinic today for:  1. HTN associated with T2DM, CKD4 and HLD Seeing endocrinology tomorrow, nephrology at end of month.  Utilizing CGM for continuous glucose monitoring.  He actually had some difficulty obtaining the libre 3 and was then transitioned over to the Deming 3+.  Concerns he might need mapping for HD prep.  He's not sure how he feels about this yet but will do what he has to.  No CP, SOB, edema. Had a really bad headache in July.  No recurrence since.   ROS: Per HPI  Allergies  Allergen Reactions   Other Hives    Pt states that he is allergic to an ABT but he can not remember what it is   Sulfa Antibiotics Hives   Past Medical History:  Diagnosis Date   Diabetes (HCC)    Diabetic neuropathy (HCC)    Essential hypertension, benign 06/01/2019   Hyperlipidemia     Current Outpatient Medications:    amLODipine (NORVASC) 10 MG tablet, TAKE 1 TABLET BY MOUTH DAILY, Disp: 90 tablet, Rfl: 3   atorvastatin (LIPITOR) 40 MG tablet, TAKE 1 TABLET BY MOUTH DAILY, Disp: 100 tablet, Rfl: 1   blood glucose meter kit and supplies, Dispense based on patient and insurance preference. Use up to four times daily as directed. (FOR ICD-10 E10.9, E11.9). Check blood sugar twice a day as directed., Disp: 1 each, Rfl: 0   calcitRIOL (ROCALTROL) 0.25 MCG capsule, Take 0.25 mcg by mouth 3 (three) times a week., Disp: , Rfl:    chlorthalidone (HYGROTON) 25 MG tablet, Take 25 mg by mouth daily., Disp: , Rfl:    Continuous Glucose Receiver (FREESTYLE LIBRE 3 READER) DEVI, 1 each by Does not apply route in the morning, at noon, in the evening, and at bedtime. Use to check BGs E11.9, Disp: 1 each, Rfl: 0   Continuous Glucose Sensor (FREESTYLE LIBRE 3 SENSOR) MISC, Place 1 sensor on the skin every 14 days. Use to check glucose continuously E11.9, Disp: 6 each, Rfl: 0   [employer] D3 25 MCG (1000 UT) capsule, Take  1,000 Units by mouth daily., Disp: , Rfl:    gabapentin (NEURONTIN) 300 MG capsule, TAKE 1 CAPSULE BY MOUTH IN THE  MORNING AND TAKE 3 CAPSULES BY  MOUTH IN THE EVENING, Disp: 360 capsule, Rfl: 0   glucose blood (ACCU-CHEK GUIDE) test strip, Test blood sugar 4 times per day, before meals and at bedtime., Disp: 500 strip, Rfl: 3   insulin NPH-regular Human (70-30) 100 UNIT/ML injection, Inject 20 Units into the skin 2 (two) times daily before a meal., Disp: , Rfl:    Insulin Pen Needle (PEN NEEDLES) 31G X 8 MM MISC, 1 each by Does not apply route in the morning and at bedtime., Disp: 100 each, Rfl: 11   Insulin Syringe-Needle U-100 (BD INSULIN SYRINGE U/F) 31G X 5/16" 0.3 ML MISC, USE WITH INSULIN TWICE  DAILY DX E11.22, Disp: 200 each, Rfl: 3   levothyroxine (SYNTHROID) 50 MCG tablet, Take 1 tablet (50 mcg total) by mouth daily before breakfast., Disp: 100 tablet, Rfl: 2   rizatriptan (MAXALT-MLT) 10 MG disintegrating tablet, Take 1 tablet (10 mg total) by mouth as needed for migraine. May repeat in 2 hours if needed, Disp: 10 tablet, Rfl: 0   sodium bicarbonate 650 MG tablet, Take 650 mg by mouth 2 (two) times daily., Disp: , Rfl:  Social History  Socioeconomic History   Marital status: Single    Spouse name: Not on file   Number of children: 3   Years of education: Not on file   Highest education level: Not on file  Occupational History   Occupation: retired  Tobacco Use   Smoking status: Never   Smokeless tobacco: Never  Vaping Use   Vaping status: Never Used  Substance and Sexual Activity   Alcohol use: Not Currently   Drug use: Never   Sexual activity: Not Currently  Other Topics Concern   Not on file  Social History Narrative   Not on file   Social Determinants of Health   Financial Resource Strain: Low Risk  (04/07/2022)   Overall Financial Resource Strain (CARDIA)    Difficulty of Paying Living Expenses: Not hard at all  Food Insecurity: No Food Insecurity (04/07/2022)    Hunger Vital Sign    Worried About Running Out of Food in the Last Year: Never true    Ran Out of Food in the Last Year: Never true  Transportation Needs: No Transportation Needs (04/07/2022)   PRAPARE - Administrator, Civil Service (Medical): No    Lack of Transportation (Non-Medical): No  Physical Activity: Insufficiently Active (04/07/2022)   Exercise Vital Sign    Days of Exercise per Week: 3 days    Minutes of Exercise per Session: 30 min  Stress: No Stress Concern Present (04/07/2022)   [employer] of Occupational Health - Occupational Stress Questionnaire    Feeling of Stress : Not at all  Social Connections: Moderately Isolated (04/07/2022)   Social Connection and Isolation Panel [NHANES]    Frequency of Communication with Friends and Family: More than three times a week    Frequency of Social Gatherings with Friends and Family: More than three times a week    Attends Religious Services: More than 4 times per year    Active Member of [employer] or Organizations: No    Attends Banker Meetings: Never    Marital Status: Widowed  Intimate Partner Violence: Not At Risk (04/07/2022)   Humiliation, Afraid, Rape, and Kick questionnaire    Fear of Current or Ex-Partner: No    Emotionally Abused: No    Physically Abused: No    Sexually Abused: No   Family History  Problem Relation Age of Onset   Stroke Mother    Diabetes Mother    Hypertension Mother    Hyperlipidemia Mother    Kidney disease Mother    COPD Father    Diabetes Sister    Stroke Brother    Diabetes Brother    Arthritis Maternal Grandmother     Objective: Office vital signs reviewed. BP 127/69   Pulse 67   Temp 98.8 F (37.1 C)   Ht 5\' 9"  (1.753 m)   SpO2 94%   BMI 29.18 kg/m   Physical Examination:  General: Awake, alert, well nourished, No acute distress HEENT: sclera white, MMM Cardio: regular rate and rhythm, S1S2 heard, no murmurs appreciated Pulm: clear to auscultation  bilaterally, no wheezes, rhonchi or rales; normal work of breathing on room air Extremities: no edema.  Assessment/ Plan: 67 y.o. male   Hypertension associated with diabetes (HCC)  Type 2 diabetes mellitus with stage 4 chronic kidney disease, with long-term current use of insulin (HCC)  Screening for colon cancer - Plan: Cologuard  Encounter for immunization - Plan: Flu Vaccine Trivalent High Dose (Fluad)  BP controlled.  No changes  Has endo appt tomorrow.  Will defer labs.  Sounds like he is nearing needing HD.  Keep appt with nephrology  Cologuard ordered for colon cancer screening  Influenza vaccine adminstered  Deyton Ellenbecker Hulen Skains, DO [facility] [phone]

## 2022-11-20 ENCOUNTER — Other Ambulatory Visit: Payer: Self-pay

## 2022-11-20 ENCOUNTER — Encounter: Payer: Self-pay | Admitting: Nurse Practitioner

## 2022-11-20 ENCOUNTER — Ambulatory Visit (INDEPENDENT_AMBULATORY_CARE_PROVIDER_SITE_OTHER): Payer: 59 | Admitting: Nurse Practitioner

## 2022-11-20 VITALS — BP 138/84 | HR 70 | Ht 69.0 in | Wt 203.2 lb

## 2022-11-20 DIAGNOSIS — I10 Essential (primary) hypertension: Secondary | ICD-10-CM | POA: Diagnosis not present

## 2022-11-20 DIAGNOSIS — E1122 Type 2 diabetes mellitus with diabetic chronic kidney disease: Secondary | ICD-10-CM

## 2022-11-20 DIAGNOSIS — N184 Chronic kidney disease, stage 4 (severe): Secondary | ICD-10-CM | POA: Diagnosis not present

## 2022-11-20 DIAGNOSIS — E039 Hypothyroidism, unspecified: Secondary | ICD-10-CM | POA: Diagnosis not present

## 2022-11-20 DIAGNOSIS — E782 Mixed hyperlipidemia: Secondary | ICD-10-CM

## 2022-11-20 DIAGNOSIS — Z794 Long term (current) use of insulin: Secondary | ICD-10-CM | POA: Diagnosis not present

## 2022-11-20 DIAGNOSIS — E559 Vitamin D deficiency, unspecified: Secondary | ICD-10-CM

## 2022-11-20 LAB — POCT GLYCOSYLATED HEMOGLOBIN (HGB A1C): Hemoglobin A1C: 7.6 % — AB (ref 4.0–5.6)

## 2022-11-20 NOTE — Progress Notes (Signed)
11/20/2022, 11:45 AM  Endocrinology follow-up note   Subjective:    Patient ID: Anthony French, male    DOB: 1955/03/26.  Anthony French is being seen in follow-up after he was seen in consultation for management of currently uncontrolled symptomatic diabetes requested by  [name], DO.   Past Medical History:  Diagnosis Date   Diabetes (HCC)    Diabetic neuropathy (HCC)    Essential hypertension, benign 06/01/2019   Hyperlipidemia     Past Surgical History:  Procedure Laterality Date   APPENDECTOMY  1980   CYST REMOVAL TRUNK      Social History   Socioeconomic History   Marital status: Single    Spouse name: Not on file   Number of children: 3   Years of education: Not on file   Highest education level: Not on file  Occupational History   Occupation: retired  Tobacco Use   Smoking status: Never   Smokeless tobacco: Never  Vaping Use   Vaping status: Never Used  Substance and Sexual Activity   Alcohol use: Not Currently   Drug use: Never   Sexual activity: Not Currently  Other Topics Concern   Not on file  Social History Narrative   Not on file   Social Determinants of Health   Financial Resource Strain: Low Risk  (04/07/2022)   Overall Financial Resource Strain (CARDIA)    Difficulty of Paying Living Expenses: Not hard at all  Food Insecurity: No Food Insecurity (04/07/2022)   Hunger Vital Sign    Worried About Running Out of Food in the Last Year: Never true    Ran Out of Food in the Last Year: Never true  Transportation Needs: No Transportation Needs (04/07/2022)   PRAPARE - Administrator, Civil Service (Medical): No    Lack of Transportation (Non-Medical): No  Physical Activity: Insufficiently Active (04/07/2022)   Exercise Vital Sign    Days of Exercise per Week: 3 days    Minutes of Exercise per Session: 30 min  Stress: No Stress Concern Present (04/07/2022)   [employer] of Occupational Health -  Occupational Stress Questionnaire    Feeling of Stress : Not at all  Social Connections: Moderately Isolated (04/07/2022)   Social Connection and Isolation Panel [NHANES]    Frequency of Communication with Friends and Family: More than three times a week    Frequency of Social Gatherings with Friends and Family: More than three times a week    Attends Religious Services: More than 4 times per year    Active Member of [employer] or Organizations: No    Attends Banker Meetings: Never    Marital Status: Widowed    Family History  Problem Relation Age of Onset   Stroke Mother    Diabetes Mother    Hypertension Mother    Hyperlipidemia Mother    Kidney disease Mother    COPD Father    Diabetes Sister    Stroke Brother    Diabetes Brother    Arthritis Maternal Grandmother     Outpatient Encounter Medications as of 11/20/2022  Medication Sig   amLODipine (NORVASC) 10 MG tablet TAKE 1 TABLET BY MOUTH DAILY   atorvastatin (LIPITOR) 40 MG tablet TAKE 1 TABLET BY MOUTH DAILY   blood glucose meter kit and supplies Dispense based on patient and insurance preference. Use up to four times daily as directed. (FOR ICD-10 E10.9, E11.9). Check blood sugar twice  a day as directed.   calcitRIOL (ROCALTROL) 0.25 MCG capsule Take 0.25 mcg by mouth 3 (three) times a week.   chlorthalidone (HYGROTON) 25 MG tablet Take 25 mg by mouth daily.   Continuous Glucose Receiver (FREESTYLE LIBRE 3 READER) DEVI 1 each by Does not apply route in the morning, at noon, in the evening, and at bedtime. Use to check BGs E11.9   Continuous Glucose Sensor (FREESTYLE LIBRE 3 SENSOR) MISC Place 1 sensor on the skin every 14 days. Use to check glucose continuously E11.9   [employer] D3 25 MCG (1000 UT) capsule Take 1,000 Units by mouth daily.   gabapentin (NEURONTIN) 300 MG capsule TAKE 1 CAPSULE BY MOUTH IN THE  MORNING AND TAKE 3 CAPSULES BY  MOUTH IN THE EVENING   glucose blood (ACCU-CHEK GUIDE) test strip Test blood  sugar 4 times per day, before meals and at bedtime.   insulin NPH-regular Human (70-30) 100 UNIT/ML injection Inject 20 Units into the skin 2 (two) times daily before a meal.   Insulin Pen Needle (PEN NEEDLES) 31G X 8 MM MISC 1 each by Does not apply route in the morning and at bedtime.   Insulin Syringe-Needle U-100 (BD INSULIN SYRINGE U/F) 31G X 5/16" 0.3 ML MISC USE WITH INSULIN TWICE  DAILY DX E11.22   levothyroxine (SYNTHROID) 50 MCG tablet Take 1 tablet (50 mcg total) by mouth daily before breakfast.   rizatriptan (MAXALT-MLT) 10 MG disintegrating tablet Take 1 tablet (10 mg total) by mouth as needed for migraine. May repeat in 2 hours if needed   sodium bicarbonate 650 MG tablet Take 650 mg by mouth 2 (two) times daily.   No facility-administered encounter medications on file as of 11/20/2022.    ALLERGIES: Allergies  Allergen Reactions   Other Hives    Pt states that he is allergic to an ABT but he can not remember what it is   Sulfa Antibiotics Hives    VACCINATION STATUS: Immunization History  Administered Date(s) Administered   Fluad Quad(high Dose 65+) 11/02/2019, 12/27/2020   Fluad Trivalent(High Dose 65+) 11/19/2022   Influenza Inj Mdck Quad With Preservative 11/26/2015   Influenza,inj,Quad PF,6+ Mos 12/25/2016   Pneumococcal Conjugate-13 12/27/2018   Pneumococcal Polysaccharide-23 04/16/2017   Tdap 08/19/2017   Zoster Recombinant(Shingrix) 07/28/2019, 09/07/2020    Diabetes He presents for his follow-up diabetic visit. He has type 2 diabetes mellitus. Onset time: He was diagnosed at approximate age of 48 years. His disease course has been fluctuating. There are no hypoglycemic associated symptoms. Pertinent negatives for hypoglycemia include no headaches, nervousness/anxiousness, pallor or tremors. Pertinent negatives for diabetes include no chest pain, no fatigue, no polydipsia, no polyphagia, no polyuria and no weight loss. There are no hypoglycemic complications.  Symptoms are stable. Diabetic complications include nephropathy, peripheral neuropathy and retinopathy. Risk factors for coronary artery disease include dyslipidemia, diabetes mellitus, hypertension, male sex, sedentary lifestyle and family history. Current diabetic treatment includes insulin injections. He is compliant with treatment most of the time. His weight is fluctuating minimally. He is following a generally healthy diet. When asked about meal planning, he reported none. He has had a previous visit with a dietitian. He never participates in exercise. His home blood glucose trend is fluctuating dramatically. His overall blood glucose range is >200 mg/dl. (He presents today with his CGM and logs showing fluctuating glycemic profile overall.  His POCT A1c today is 7.6%, increasing from last visit of 6.9%.  He denies any significant hypoglycemia.  Analysis of  his CGM shows TIR 24%, TAR 76%, TBR 0% with a GMI of 8.9%.  He notes he had to go back to fingersticks for a time period due to shortage of libre 3 sensors.  ) An ACE inhibitor/angiotensin II receptor blocker is being taken. He does not see a podiatrist.Eye exam is current.  Hyperlipidemia This is a chronic problem. The current episode started more than 1 year ago. The problem is controlled. Recent lipid tests were reviewed and are normal. Exacerbating diseases include chronic renal disease, diabetes and hypothyroidism. There are no known factors aggravating his hyperlipidemia. Pertinent negatives include no chest pain, myalgias or shortness of breath. Current antihyperlipidemic treatment includes statins. The current treatment provides significant improvement of lipids. There are no compliance problems.  Risk factors for coronary artery disease include diabetes mellitus, dyslipidemia, hypertension, male sex and a sedentary lifestyle.  Hypertension This is a chronic problem. The current episode started more than 1 year ago. The problem has been  gradually improving since onset. The problem is controlled. Pertinent negatives include no chest pain, headaches, palpitations or shortness of breath. There are no associated agents to hypertension. Risk factors for coronary artery disease include dyslipidemia, diabetes mellitus, family history, male gender and sedentary lifestyle. Past treatments include calcium channel blockers and angiotensin blockers. The current treatment provides mild improvement. There are no compliance problems.  Hypertensive end-organ damage includes kidney disease and retinopathy. Identifiable causes of hypertension include chronic renal disease and a thyroid problem.  Thyroid Problem Presents for follow-up (Abnormality noted with recent thyroid studies.  No prior work-up done, no previous labs to compare to.) visit. Onset time: Unknown. Patient reports no anxiety, cold intolerance, constipation, diarrhea, fatigue, heat intolerance, palpitations, tremors, weight gain or weight loss. The symptoms have been stable. Past treatments include nothing. His past medical history is significant for diabetes and hyperlipidemia. There are no known risk factors.    Review of systems  Constitutional: + Minimally fluctuating body weight,  current Body mass index is 30.01 kg/m. , no fatigue, no subjective hyperthermia, no subjective hypothermia Eyes: no blurry vision, no xerophthalmia ENT: no sore throat, no nodules palpated in throat, no dysphagia/odynophagia, no hoarseness Cardiovascular: no chest pain, no shortness of breath, no palpitations, no leg swelling Respiratory: no cough, no shortness of breath Gastrointestinal: no nausea/vomiting/diarrhea Musculoskeletal: no muscle/joint aches Skin: no rashes, no hyperemia Neurological: no tremors, no numbness, no tingling, no dizziness Psychiatric: no depression, no anxiety   Objective:    BP 138/84 (BP Location: Right Arm, Patient Position: Sitting, Cuff Size: Large)   Pulse 70   Ht  5\' 9"  (1.753 m)   Wt 203 lb 3.2 oz (92.2 kg)   BMI 30.01 kg/m   Wt Readings from Last 3 Encounters:  11/20/22 203 lb 3.2 oz (92.2 kg)  09/09/22 197 lb 9.6 oz (89.6 kg)  08/25/22 202 lb (91.6 kg)     BP Readings from Last 3 Encounters:  11/20/22 138/84  11/19/22 127/69  09/09/22 116/69      Physical Exam- Limited  Constitutional:  Body mass index is 30.01 kg/m. , not in acute distress, normal state of mind Eyes:  EOMI, no exophthalmos Musculoskeletal: no gross deformities, strength intact in all four extremities, no gross restriction of joint movements Skin:  no rashes, no hyperemia Neurological: no tremor with outstretched hands  Diabetic Foot Exam - Simple   No data filed     CMP ( most recent) CMP     Component Value Date/Time   NA  140 09/09/2022 1139   K 5.7 (H) 09/09/2022 1139   CL 107 (H) 09/09/2022 1139   CO2 18 (L) 09/09/2022 1139   GLUCOSE 190 (H) 09/09/2022 1139   GLUCOSE 232 (H) 09/19/2020 1412   BUN 72 (H) 09/09/2022 1139   CREATININE 3.89 (HH) 09/09/2022 1139   CALCIUM 9.2 09/09/2022 1139   PROT 6.1 08/15/2021 1018   ALBUMIN 4.1 08/15/2021 1018   AST 13 08/15/2021 1018   ALT 13 08/15/2021 1018   ALKPHOS 82 08/15/2021 1018   BILITOT 0.3 08/15/2021 1018   GFRNONAA 18 04/23/2022 1536   GFRAA 47 (L) 09/13/2019 1420    Diabetic Labs (most recent): Lab Results  Component Value Date   HGBA1C 7.6 (A) 11/20/2022   HGBA1C 6.9 (A) 08/20/2022   HGBA1C 6.3 (A) 11/29/2021   MICROALBUR 150 08/20/2020   MICROALBUR 237.3 09/13/2019     Lipid Panel ( most recent) Lipid Panel     Component Value Date/Time   CHOL 177 06/09/2022 0948   TRIG 206 (H) 06/09/2022 0948   HDL 30 (L) 06/09/2022 0948   CHOLHDL 5.9 (H) 06/09/2022 0948   LDLCALC 111 (H) 06/09/2022 0948   LABVLDL 36 06/09/2022 0948     Assessment & Plan:   1) Type 2 diabetes mellitus with stage 3b chronic kidney disease, with long-term current use of insulin (HCC)  - Anthony French has  currently uncontrolled symptomatic type 2 DM since  67 years of age.   Recent labs reviewed.  He presents today with his CGM and logs showing fluctuating glycemic profile overall.  His POCT A1c today is 7.6%, increasing from last visit of 6.9%.  He denies any significant hypoglycemia.  Analysis of his CGM shows TIR 24%, TAR 76%, TBR 0% with a GMI of 8.9%.  He notes he had to go back to fingersticks for a time period due to shortage of libre 3 sensors.    - I had a long discussion with him about the progressive nature of diabetes and the pathology behind its complications. -his diabetes is complicated by CKD, peripheral neuropathy and he remains at a high risk for more acute and chronic complications which include CAD, CVA, CKD, retinopathy, and neuropathy. These are all discussed in detail with him.  - Nutritional counseling repeated at each appointment due to patients tendency to fall back in to old habits.  - The patient admits there is a room for improvement in their diet and drink choices. -  Suggestion is made for the patient to avoid simple carbohydrates from their diet including Cakes, Sweet Desserts / Pastries, Ice Cream, Soda (diet and regular), Sweet Tea, Candies, Chips, Cookies, Sweet Pastries, Store Bought Juices, Alcohol in Excess of 1-2 drinks a day, Artificial Sweeteners, Coffee Creamer, and "Sugar-free" Products. This will help patient to have stable blood glucose profile and potentially avoid unintended weight gain.   - I encouraged the patient to switch to unprocessed or minimally processed complex starch and increased protein intake (animal or plant source), fruits, and vegetables.   - Patient is advised to stick to a routine mealtimes to eat 3 meals a day and avoid unnecessary snacks (to snack only to correct hypoglycemia).  - I have approached him with the following individualized plan to manage  his diabetes and patient agrees:   - he will continue to benefit from simplicity  of premixed insulin.   -He is advised to adjust his Novolin 70/30 to 20 units with breakfast and 15 units with supper if  glucose is above 90 and he is eating.  We discussed the importance of sticking to his diet and avoid processed sweets.  -He is encouraged to continue using his CGM to monitor blood glucose 4 times per day, before meals and before bed, and call the clinic if he has readings less than 70 or greater than 300 for 3 tests in a row.  - Specific targets for  A1c;  LDL, HDL,  and Triglycerides were discussed with the patient.  2) Blood Pressure /Hypertension:  His blood pressure is controlled to target.  He is advised to continue Chlorthalidone 25 mg po daily as well as Amlodipine 10 mg po daily as needed.  3) Lipids/Hyperlipidemia:  His most recent lipid panel from 06/09/22 shows uncontrolled LDL of 111.  He is advised to continue Atorvastatin 40 mg po daily at bedtime.  Side effects and precautions discussed with him.    4)  Weight/Diet:  His Body mass index is 30.01 kg/m.     he is a candidate for some weight loss. I discussed with him the fact that loss of 5 - 10% of his  current body weight will have the most impact on his diabetes management.  Exercise, and detailed carbohydrates information provided  -  detailed on discharge instructions.  5)Hypothyroidism-acquired: There are no recent TFTs to review.  He is advised to continue his Levothyroxine 50 mcg po daily before breakfast.  Will recheck TFTs (ok for him to have this done later this month and I will call with results).   - The correct intake of thyroid hormone (Levothyroxine, Synthroid), is on empty stomach first thing in the morning, with water, separated by at least 30 minutes from breakfast and other medications,  and separated by more than 4 hours from calcium, iron, multivitamins, acid reflux medications (PPIs).  - This medication is a life-long medication and will be needed to correct thyroid hormone imbalances for  the rest of your life.  The dose may change from time to time, based on thyroid blood work.  - It is extremely important to be consistent taking this medication, near the same time each morning.  -AVOID TAKING PRODUCTS CONTAINING BIOTIN (commonly found in Hair, Skin, Nails vitamins) AS IT INTERFERES WITH THE VALIDITY OF THYROID FUNCTION BLOOD TESTS.  6) Chronic Care/Health Maintenance: -he is not on ARB (per nephrology) and is on Statin medications and is encouraged to initiate and continue to follow up with Ophthalmology, Dentist,  Podiatrist at least yearly or according to recommendations, and advised to stay away from smoking. I have recommended yearly flu vaccine and pneumonia vaccine at least every 5 years; moderate intensity exercise for up to 150 minutes weekly; and  sleep for at least 7 hours a day.  - he is advised to maintain close follow up with [name], DO for primary care needs, as well as his other providers for optimal and coordinated care  He is being worked up for HD in the future by nephrology.     I spent  31  minutes in the care of the patient today including review of labs from CMP, Lipids, Thyroid Function, Hematology (current and previous including abstractions from other facilities); face-to-face time discussing  his blood glucose readings/logs, discussing hypoglycemia and hyperglycemia episodes and symptoms, medications doses, his options of short and long term treatment based on the latest standards of care / guidelines;  discussion about incorporating lifestyle medicine;  and documenting the encounter. Risk reduction counseling performed per  USPSTF guidelines to reduce obesity and cardiovascular risk factors.     Please refer to Patient Instructions for Blood Glucose Monitoring and Insulin/Medications Dosing Guide"  in media tab for additional information. Please  also refer to " Patient Self Inventory" in the Media  tab for reviewed elements of pertinent  patient history.  Anthony French participated in the discussions, expressed understanding, and voiced agreement with the above plans.  All questions were answered to his satisfaction. he is encouraged to contact clinic should he have any questions or concerns prior to his return visit.   Follow up plan: - Return in about 3 months (around 02/20/2023) for Diabetes F/U with A1c in office, Thyroid follow up, Previsit labs, Bring meter and logs.  Ronny Bacon, [facility] [address] [place], [place] [address] Phone: [phone] Fax: [phone]   11/20/2022, 11:45 AM

## 2022-11-26 ENCOUNTER — Other Ambulatory Visit: Payer: Self-pay | Admitting: Family Medicine

## 2022-11-27 ENCOUNTER — Ambulatory Visit (INDEPENDENT_AMBULATORY_CARE_PROVIDER_SITE_OTHER)
Admission: RE | Admit: 2022-11-27 | Discharge: 2022-11-27 | Disposition: A | Discharge status: Home / Self Care | Payer: 59 | Source: Ambulatory Visit | Attending: Vascular Surgery | Admitting: Vascular Surgery

## 2022-11-27 ENCOUNTER — Ambulatory Visit (HOSPITAL_COMMUNITY)
Admission: RE | Admit: 2022-11-27 | Discharge: 2022-11-27 | Disposition: A | Discharge status: Home / Self Care | Payer: 59 | Source: Ambulatory Visit | Attending: Vascular Surgery | Admitting: Vascular Surgery

## 2022-11-27 ENCOUNTER — Ambulatory Visit: Payer: 59 | Admitting: Vascular Surgery

## 2022-11-27 VITALS — BP 136/74 | HR 63 | Temp 98.4°F | Ht 69.0 in | Wt 201.9 lb

## 2022-11-27 DIAGNOSIS — N183 Chronic kidney disease, stage 3 unspecified: Secondary | ICD-10-CM | POA: Diagnosis not present

## 2022-11-27 DIAGNOSIS — N184 Chronic kidney disease, stage 4 (severe): Secondary | ICD-10-CM | POA: Diagnosis not present

## 2022-11-27 DIAGNOSIS — E1122 Type 2 diabetes mellitus with diabetic chronic kidney disease: Secondary | ICD-10-CM | POA: Insufficient documentation

## 2022-11-27 NOTE — Progress Notes (Signed)
Office Note     CC:  ESRD Requesting Provider:  Johnn Hai, NP  HPI: Netanel Haneline is a Right handed 67 y.o. ([birth year]) male with kidney disease who presents at the request of Johnn Hai, NP for permanent HD access. The patient has had no prior access procedures.  On exam today, he was doing well, accompanied by his daughter.  A native of [facility] [place], he has lived there his entire life.  His daughter lives in [place]..  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  He has had chronic kidney disease for a number of years which has progressed slowly.  He has been to the dialysis classes, and had very few questions regarding fistula creation.  No history of upper extremity surgeries.  No history of neuropathy.    Past Medical History:  Diagnosis Date   Diabetes (HCC)    Diabetic neuropathy (HCC)    Essential hypertension, benign 06/01/2019   Hyperlipidemia     Past Surgical History:  Procedure Laterality Date   APPENDECTOMY  1980   CYST REMOVAL TRUNK      Social History   Socioeconomic History   Marital status: Single    Spouse name: Not on file   Number of children: 3   Years of education: Not on file   Highest education level: Not on file  Occupational History   Occupation: retired  Tobacco Use   Smoking status: Never   Smokeless tobacco: Never  Vaping Use   Vaping status: Never Used  Substance and Sexual Activity   Alcohol use: Not Currently   Drug use: Never   Sexual activity: Not Currently  Other Topics Concern   Not on file  Social History Narrative   Not on file   Social Determinants of Health   Financial Resource Strain: Low Risk  (04/07/2022)   Overall Financial Resource Strain (CARDIA)    Difficulty of Paying Living Expenses: Not hard at all  Food Insecurity: No Food Insecurity (04/07/2022)   Hunger Vital Sign    Worried About  Running Out of Food in the Last Year: Never true    Ran Out of Food in the Last Year: Never true  Transportation Needs: No Transportation Needs (04/07/2022)   PRAPARE - Administrator, Civil Service (Medical): No    Lack of Transportation (Non-Medical): No  Physical Activity: Insufficiently Active (04/07/2022)   Exercise Vital Sign    Days of Exercise per Week: 3 days    Minutes of Exercise per Session: 30 min  Stress: No Stress Concern Present (04/07/2022)   [employer] of Occupational Health - Occupational Stress Questionnaire    Feeling of Stress : Not at all  Social Connections: Moderately Isolated (04/07/2022)   Social Connection and Isolation Panel [NHANES]    Frequency of Communication with Friends and Family: More than three times a week    Frequency of Social Gatherings with Friends and Family: More than three times a week    Attends Religious Services: More than 4 times per year    Active Member of [employer] or Organizations: No    Attends Banker Meetings: Never    Marital Status: Widowed  Intimate Partner Violence: Not At Risk (04/07/2022)   Humiliation, Afraid, Rape, and Kick questionnaire    Fear of Current or Ex-Partner: No    Emotionally Abused: No    Physically Abused:  No    Sexually Abused: No   Family History  Problem Relation Age of Onset   Stroke Mother    Diabetes Mother    Hypertension Mother    Hyperlipidemia Mother    Kidney disease Mother    COPD Father    Diabetes Sister    Stroke Brother    Diabetes Brother    Arthritis Maternal Grandmother     Current Outpatient Medications  Medication Sig Dispense Refill   amLODipine (NORVASC) 10 MG tablet TAKE 1 TABLET BY MOUTH DAILY 90 tablet 3   atorvastatin (LIPITOR) 40 MG tablet TAKE 1 TABLET BY MOUTH DAILY 100 tablet 1   blood glucose meter kit and supplies Dispense based on patient and insurance preference. Use up to four times daily as directed. (FOR ICD-10 E10.9, E11.9). Check  blood sugar twice a day as directed. 1 each 0   calcitRIOL (ROCALTROL) 0.25 MCG capsule Take 0.25 mcg by mouth 3 (three) times a week.     chlorthalidone (HYGROTON) 25 MG tablet Take 25 mg by mouth daily.     Continuous Glucose Receiver (FREESTYLE LIBRE 3 READER) DEVI 1 each by Does not apply route in the morning, at noon, in the evening, and at bedtime. Use to check BGs E11.9 1 each 0   Continuous Glucose Sensor (FREESTYLE LIBRE 3 SENSOR) MISC Place 1 sensor on the skin every 14 days. Use to check glucose continuously E11.9 6 each 0   [employer] D3 25 MCG (1000 UT) capsule Take 1,000 Units by mouth daily.     gabapentin (NEURONTIN) 300 MG capsule TAKE 1 CAPSULE BY MOUTH IN THE  MORNING AND TAKE 3 CAPSULES BY  MOUTH IN THE EVENING 360 capsule 0   glucose blood (ACCU-CHEK GUIDE) test strip Test blood sugar 4 times per day, before meals and at bedtime. 500 strip 3   insulin NPH-regular Human (70-30) 100 UNIT/ML injection Inject 20 Units into the skin 2 (two) times daily before a meal.     Insulin Pen Needle (PEN NEEDLES) 31G X 8 MM MISC 1 each by Does not apply route in the morning and at bedtime. 100 each 11   Insulin Syringe-Needle U-100 (BD INSULIN SYRINGE U/F) 31G X 5/16" 0.3 ML MISC USE WITH INSULIN TWICE  DAILY DX E11.22 200 each 3   levothyroxine (SYNTHROID) 50 MCG tablet Take 1 tablet (50 mcg total) by mouth daily before breakfast. 100 tablet 2   rizatriptan (MAXALT-MLT) 10 MG disintegrating tablet TAKE 1 TABLET BY MOUTH AS NEEDED FOR MIGRAINE. MAY REPEAT IN 2 HOURS IF NEEDED 10 tablet 2   sodium bicarbonate 650 MG tablet Take 650 mg by mouth 2 (two) times daily.     No current facility-administered medications for this visit.    Allergies  Allergen Reactions   Other Hives    Pt states that he is allergic to an ABT but he can not remember what it is   Sulfa Antibiotics Hives     REVIEW OF SYSTEMS:  [X]  denotes positive finding, [ ]  denotes negative finding Cardiac  Comments:  Chest pain  or chest pressure:    Shortness of breath upon exertion:    Short of breath when lying flat:    Irregular heart rhythm:        Vascular    Pain in calf, thigh, or hip brought on by ambulation:    Pain in feet at night that wakes you up from your sleep:     Blood clot in  your veins:    Leg swelling:         Pulmonary    Oxygen at home:    Productive cough:     Wheezing:         Neurologic    Sudden weakness in arms or legs:     Sudden numbness in arms or legs:     Sudden onset of difficulty speaking or slurred speech:    Temporary loss of vision in one eye:     Problems with dizziness:         Gastrointestinal    Blood in stool:     Vomited blood:         Genitourinary    Burning when urinating:     Blood in urine:        Psychiatric    Major depression:         Hematologic    Bleeding problems:    Problems with blood clotting too easily:        Skin    Rashes or ulcers:        Constitutional    Fever or chills:      PHYSICAL EXAMINATION:  There were no vitals filed for this visit.  General:  WDWN in NAD; vital signs documented above Gait: Not observed HENT: WNL, normocephalic Pulmonary: normal non-labored breathing , without Rales, rhonchi,  wheezing Cardiac: regular HR Abdomen: soft, NT, no masses Skin: without rashes Vascular Exam/Pulses:  Right Left  Radial 2+ (normal) 2+ (normal)                       Extremities: without ischemic changes, without Gangrene , without cellulitis; without open wounds;  Musculoskeletal: no muscle wasting or atrophy  Neurologic: A&O X 3;  No focal weakness or paresthesias are detected Psychiatric:  The pt has Normal affect.   Non-Invasive Vascular Imaging:    +-----------------+-------------+----------+---------+  Right Cephalic   Diameter (cm)Depth (cm)Findings   +-----------------+-------------+----------+---------+  Prox upper arm       0.34        0.45               +-----------------+-------------+----------+---------+  Mid upper arm        0.47        0.31              +-----------------+-------------+----------+---------+  Dist upper arm       0.43        0.53              +-----------------+-------------+----------+---------+  Antecubital fossa    0.39        0.56   branching  +-----------------+-------------+----------+---------+  Prox forearm         0.35        0.29              +-----------------+-------------+----------+---------+  Mid forearm          0.37        0.25              +-----------------+-------------+----------+---------+  Dist forearm         0.18        0.25              +-----------------+-------------+----------+---------+   +-----------------+-------------+----------+---------+  Right Basilic    Diameter (cm)Depth (cm)Findings   +-----------------+-------------+----------+---------+  Prox upper arm       1.13        1.08              +-----------------+-------------+----------+---------+  Mid upper arm        0.42        1.38   branching  +-----------------+-------------+----------+---------+  Dist upper arm       0.50        1.13              +-----------------+-------------+----------+---------+  Antecubital fossa    0.27        0.44   branching  +-----------------+-------------+----------+---------+  Prox forearm         0.28        0.26              +-----------------+-------------+----------+---------+  Mid forearm          0.15        0.20              +-----------------+-------------+----------+---------+  Distal forearm       0.21        0.26              +-----------------+-------------+----------+---------+   +-----------------+-------------+----------+---------+  Left Cephalic    Diameter (cm)Depth (cm)Findings   +-----------------+-------------+----------+---------+  Prox upper arm       0.40        0.34               +-----------------+-------------+----------+---------+  Mid upper arm        0.46        0.37              +-----------------+-------------+----------+---------+  Dist upper arm       0.53        0.29              +-----------------+-------------+----------+---------+  Antecubital fossa    0.47        0.33   branching  +-----------------+-------------+----------+---------+  Prox forearm         0.34        0.27              +-----------------+-------------+----------+---------+  Mid forearm          0.35        0.29              +-----------------+-------------+----------+---------+  Dist forearm         0.38        0.19              +-----------------+-------------+----------+---------+   +-----------------+-------------+----------+---------+  Left Basilic     Diameter (cm)Depth (cm)Findings   +-----------------+-------------+----------+---------+  Prox upper arm       0.92        0.73              +-----------------+-------------+----------+---------+  Mid upper arm        0.33        1.02              +-----------------+-------------+----------+---------+  Dist upper arm       0.61        0.44   branching  +-----------------+-------------+----------+---------+  Antecubital fossa    0.23        0.22              +-----------------+-------------+----------+---------+  Prox forearm         0.20        0.14              +-----------------+-------------+----------+---------+  Mid forearm          0.12  0.20              +-----------------+-------------+----------+---------+  Distal forearm       0.20        0.24              +-----------------+-------------+----------+---------+         ASSESSMENT/PLAN:  Demarea Mahony is a 67 y.o. male who presents with chronic kidney disease stage 4  Based on vein mapping and examination, patient is a candidate for fistula creation and bilateral upper extremities I  had a long discussion with him regarding the above.  Being that he is right-handed, I think he would be best served with left-sided AV fistula creation.  My plan would be to perform the fistula creation in the left antecubital fossa, however I plan to look at his left radial and cephalic artery and veins respectively at the level of the wrist as they appear sizable on recent duplex ultrasound. I had an extensive discussion with this patient in regards to the nature of access surgery, including risk, benefits, and alternatives.   The patient is aware that the risks of access surgery include but are not limited to: bleeding, infection, steal syndrome, nerve damage, ischemic monomelic neuropathy, failure of access to mature, complications related to venous hypertension, and possible need for additional access procedures in the future. The patient has agreed to proceed with the above procedure which will be scheduled.  Victorino Sparrow, MD [facility] [phone]

## 2022-12-01 ENCOUNTER — Other Ambulatory Visit: Payer: Self-pay

## 2022-12-01 DIAGNOSIS — N184 Chronic kidney disease, stage 4 (severe): Secondary | ICD-10-CM

## 2022-12-04 ENCOUNTER — Other Ambulatory Visit: Payer: 59

## 2022-12-04 DIAGNOSIS — N184 Chronic kidney disease, stage 4 (severe): Secondary | ICD-10-CM

## 2022-12-04 DIAGNOSIS — N189 Chronic kidney disease, unspecified: Secondary | ICD-10-CM | POA: Diagnosis not present

## 2022-12-04 DIAGNOSIS — E1122 Type 2 diabetes mellitus with diabetic chronic kidney disease: Secondary | ICD-10-CM | POA: Diagnosis not present

## 2022-12-04 DIAGNOSIS — E875 Hyperkalemia: Secondary | ICD-10-CM | POA: Diagnosis not present

## 2022-12-04 DIAGNOSIS — D631 Anemia in chronic kidney disease: Secondary | ICD-10-CM | POA: Diagnosis not present

## 2022-12-04 DIAGNOSIS — E1129 Type 2 diabetes mellitus with other diabetic kidney complication: Secondary | ICD-10-CM | POA: Diagnosis not present

## 2022-12-08 ENCOUNTER — Encounter (HOSPITAL_COMMUNITY): Payer: Self-pay | Admitting: Vascular Surgery

## 2022-12-08 DIAGNOSIS — D631 Anemia in chronic kidney disease: Secondary | ICD-10-CM | POA: Diagnosis not present

## 2022-12-08 DIAGNOSIS — N2581 Secondary hyperparathyroidism of renal origin: Secondary | ICD-10-CM | POA: Diagnosis not present

## 2022-12-08 DIAGNOSIS — N184 Chronic kidney disease, stage 4 (severe): Secondary | ICD-10-CM | POA: Diagnosis not present

## 2022-12-08 DIAGNOSIS — E875 Hyperkalemia: Secondary | ICD-10-CM | POA: Diagnosis not present

## 2022-12-08 NOTE — Progress Notes (Addendum)
SDW call  Patient was given pre-op instructions over the phone. Patient verbalized understanding of instructions provided.     PCP - Dr. Doylene Canard Cardiologist -  Pulmonary:    PPM/ICD - Denies Device Orders - na Rep Notified - na   Chest x-ray - na EKG -  DOS, 12/09/2022 Stress Test - ECHO -  Cardiac Cath -   Sleep Study/sleep apnea/CPAP: denies  Type II diabetic Fasting Blood sugar range: 150-250 How often check sugars: Three times a day Insulin NPH 70/30, instructed to take 10.5 units with supper dose (this is 70% of his regular dose) and zero units the morning of surgery   Blood Thinner Instructions: denies Aspirin Instructions:denies   ERAS Protcol - NPO   COVID TEST- no    Anesthesia review: Yes. HTN, DM, CKD, high cholesterol   Patient denies shortness of breath, fever, cough and chest pain over the phone call  Your procedure is scheduled on Tuesday December 09, 2022   Report to [facility] Main Entrance "A" at  0530  A.M., then check in with the Admitting office.  Call this number if you have problems the morning of surgery:  [phone]   If you have any questions prior to your surgery date call [phone]: Open Monday-Friday 8am-4pm If you experience any cold or flu symptoms such as cough, fever, chills, shortness of breath, etc. between now and your scheduled surgery, please notify us at the above number    Remember:  Do not eat or drink after midnight the night before your surgery  Take these medicines the morning of surgery with A SIP OF WATER:  Gabapentin, levothyroxine, nifedipine, sodium bicarb, sodium zirconium  As needed: Maxalt  As of today, STOP taking any Aspirin (unless otherwise instructed by your surgeon) Aleve, Naproxen, Ibuprofen, Motrin, Advil, Goody's, BC's, all herbal medications, fish oil, and all vitamins.

## 2022-12-08 NOTE — Anesthesia Preprocedure Evaluation (Signed)
Anesthesia Evaluation  Patient identified by MRN, date of birth, ID band Patient awake    Reviewed: Allergy & Precautions, NPO status , Patient's Chart, lab work & pertinent test results, reviewed documented beta blocker date and time   Airway Mallampati: II       Dental  (+) Edentulous Upper, Edentulous Lower   Pulmonary pneumonia, resolved   Pulmonary exam normal breath sounds clear to auscultation       Cardiovascular hypertension, Pt. on medications Normal cardiovascular exam Rhythm:Regular Rate:Normal     Neuro/Psych  Headaches Diabetic neuropathy  Neuromuscular disease  negative psych ROS   GI/Hepatic negative GI ROS, Neg liver ROS,,,  Endo/Other  diabetes, Poorly Controlled, Type 2, Insulin Dependent  Hyperlipidemia  Renal/GU ESRFRenal diseaseDiabetic nephropathy  negative genitourinary   Musculoskeletal negative musculoskeletal ROS (+)    Abdominal   Peds  Hematology   Anesthesia Other Findings   Reproductive/Obstetrics                              Anesthesia Physical Anesthesia Plan  ASA: 3  Anesthesia Plan: MAC and Regional   Post-op Pain Management: Regional block* and Minimal or no pain anticipated   Induction: Intravenous  PONV Risk Score and Plan: 2 and Treatment may vary due to age or medical condition and Propofol infusion  Airway Management Planned: Natural Airway, Simple Face Mask and Nasal Cannula  Additional Equipment: None  Intra-op Plan:   Post-operative Plan:   Informed Consent: I have reviewed the patients History and Physical, chart, labs and discussed the procedure including the risks, benefits and alternatives for the proposed anesthesia with the patient or authorized representative who has indicated his/her understanding and acceptance.     Dental advisory given  Plan Discussed with: Anesthesiologist and CRNA  Anesthesia Plan Comments:          Anesthesia Quick Evaluation

## 2022-12-09 ENCOUNTER — Encounter (HOSPITAL_COMMUNITY)
Admission: RE | Disposition: A | Discharge status: Home / Self Care | Payer: Self-pay | Source: Home / Self Care | Attending: Vascular Surgery

## 2022-12-09 ENCOUNTER — Ambulatory Visit (HOSPITAL_COMMUNITY): Payer: 59 | Admitting: Physician Assistant

## 2022-12-09 ENCOUNTER — Encounter (HOSPITAL_COMMUNITY): Payer: Self-pay | Admitting: Vascular Surgery

## 2022-12-09 ENCOUNTER — Other Ambulatory Visit: Payer: Self-pay

## 2022-12-09 ENCOUNTER — Ambulatory Visit (HOSPITAL_COMMUNITY)
Admission: RE | Admit: 2022-12-09 | Discharge: 2022-12-09 | Disposition: A | Discharge status: Home / Self Care | Payer: 59 | Attending: Vascular Surgery | Admitting: Vascular Surgery

## 2022-12-09 ENCOUNTER — Ambulatory Visit (HOSPITAL_BASED_OUTPATIENT_CLINIC_OR_DEPARTMENT_OTHER): Payer: 59 | Admitting: Physician Assistant

## 2022-12-09 DIAGNOSIS — E1122 Type 2 diabetes mellitus with diabetic chronic kidney disease: Secondary | ICD-10-CM

## 2022-12-09 DIAGNOSIS — N186 End stage renal disease: Secondary | ICD-10-CM | POA: Diagnosis not present

## 2022-12-09 DIAGNOSIS — I120 Hypertensive chronic kidney disease with stage 5 chronic kidney disease or end stage renal disease: Secondary | ICD-10-CM

## 2022-12-09 DIAGNOSIS — E1140 Type 2 diabetes mellitus with diabetic neuropathy, unspecified: Secondary | ICD-10-CM | POA: Diagnosis not present

## 2022-12-09 DIAGNOSIS — I129 Hypertensive chronic kidney disease with stage 1 through stage 4 chronic kidney disease, or unspecified chronic kidney disease: Secondary | ICD-10-CM | POA: Diagnosis not present

## 2022-12-09 DIAGNOSIS — N184 Chronic kidney disease, stage 4 (severe): Secondary | ICD-10-CM

## 2022-12-09 DIAGNOSIS — Z833 Family history of diabetes mellitus: Secondary | ICD-10-CM | POA: Diagnosis not present

## 2022-12-09 DIAGNOSIS — G8918 Other acute postprocedural pain: Secondary | ICD-10-CM | POA: Diagnosis not present

## 2022-12-09 DIAGNOSIS — Z8249 Family history of ischemic heart disease and other diseases of the circulatory system: Secondary | ICD-10-CM | POA: Diagnosis not present

## 2022-12-09 DIAGNOSIS — N1832 Chronic kidney disease, stage 3b: Secondary | ICD-10-CM | POA: Diagnosis not present

## 2022-12-09 DIAGNOSIS — Z992 Dependence on renal dialysis: Secondary | ICD-10-CM | POA: Diagnosis not present

## 2022-12-09 HISTORY — PX: AV FISTULA PLACEMENT: SHX1204

## 2022-12-09 LAB — POCT I-STAT, CHEM 8
BUN: 67 mg/dL — ABNORMAL HIGH (ref 8–23)
Calcium, Ion: 1.08 mmol/L — ABNORMAL LOW (ref 1.15–1.40)
Chloride: 110 mmol/L (ref 98–111)
Creatinine, Ser: 4.3 mg/dL — ABNORMAL HIGH (ref 0.61–1.24)
Glucose, Bld: 134 mg/dL — ABNORMAL HIGH (ref 70–99)
HCT: 24 % — ABNORMAL LOW (ref 39.0–52.0)
Hemoglobin: 8.2 g/dL — ABNORMAL LOW (ref 13.0–17.0)
Potassium: 3.9 mmol/L (ref 3.5–5.1)
Sodium: 143 mmol/L (ref 135–145)
TCO2: 19 mmol/L — ABNORMAL LOW (ref 22–32)

## 2022-12-09 LAB — GLUCOSE, CAPILLARY
Glucose-Capillary: 125 mg/dL — ABNORMAL HIGH (ref 70–99)
Glucose-Capillary: 147 mg/dL — ABNORMAL HIGH (ref 70–99)

## 2022-12-09 SURGERY — ARTERIOVENOUS (AV) FISTULA CREATION
Anesthesia: Monitor Anesthesia Care | Site: Arm Upper | Laterality: Left

## 2022-12-09 MED ORDER — LIDOCAINE HCL (PF) 1 % IJ SOLN
INTRAMUSCULAR | Status: AC
  Filled 2022-12-09: qty 30

## 2022-12-09 MED ORDER — PROPOFOL 500 MG/50ML IV EMUL
INTRAVENOUS | Status: DC | PRN
  Administered 2022-12-09: 250 ug/kg/min via INTRAVENOUS

## 2022-12-09 MED ORDER — SODIUM CHLORIDE 0.9% FLUSH: 10.0000 mL | Freq: Two times a day (BID) | INTRAVENOUS | Status: DC

## 2022-12-09 MED ORDER — EPHEDRINE SULFATE-NACL 50-0.9 MG/10ML-% IV SOSY
PREFILLED_SYRINGE | INTRAVENOUS | Status: DC | PRN
  Administered 2022-12-09: 10 mg via INTRAVENOUS
  Administered 2022-12-09: 5 mg via INTRAVENOUS

## 2022-12-09 MED ORDER — FENTANYL CITRATE (PF) 250 MCG/5ML IJ SOLN
INTRAMUSCULAR | Status: AC
  Filled 2022-12-09: qty 5

## 2022-12-09 MED ORDER — CHLORHEXIDINE GLUCONATE 4 % EX SOLN: 60.0000 mL | Freq: Once | CUTANEOUS | Status: DC

## 2022-12-09 MED ORDER — ONDANSETRON HCL 4 MG/2ML IJ SOLN: 4.0000 mg | Freq: Once | INTRAMUSCULAR | Status: DC | PRN

## 2022-12-09 MED ORDER — SODIUM CHLORIDE 0.9 % IV SOLN: INTRAVENOUS | Status: DC | PRN

## 2022-12-09 MED ORDER — ROPIVACAINE HCL 5 MG/ML IJ SOLN
INTRAMUSCULAR | Status: DC | PRN
  Administered 2022-12-09: 30 mL via PERINEURAL

## 2022-12-09 MED ORDER — MIDAZOLAM HCL 2 MG/2ML IJ SOLN
INTRAMUSCULAR | Status: AC
  Filled 2022-12-09: qty 2

## 2022-12-09 MED ORDER — HEPARIN 6000 UNIT IRRIGATION SOLUTION
Status: DC | PRN
  Administered 2022-12-09: 1

## 2022-12-09 MED ORDER — OXYCODONE HCL 5 MG/5ML PO SOLN: 5.0000 mg | Freq: Once | ORAL | Status: DC | PRN

## 2022-12-09 MED ORDER — FENTANYL CITRATE (PF) 100 MCG/2ML IJ SOLN: 25.0000 ug | INTRAMUSCULAR | Status: DC | PRN

## 2022-12-09 MED ORDER — OXYCODONE-ACETAMINOPHEN 5-325 MG PO TABS: 1.0000 | ORAL_TABLET | Freq: Four times a day (QID) | ORAL | 0 refills | Status: AC | PRN

## 2022-12-09 MED ORDER — HEPARIN SODIUM (PORCINE) 1000 UNIT/ML IJ SOLN
INTRAMUSCULAR | Status: AC
  Filled 2022-12-09: qty 10

## 2022-12-09 MED ORDER — FENTANYL CITRATE (PF) 250 MCG/5ML IJ SOLN
INTRAMUSCULAR | Status: DC | PRN
  Administered 2022-12-09: 50 ug via INTRAVENOUS

## 2022-12-09 MED ORDER — ORAL CARE MOUTH RINSE: 15.0000 mL | Freq: Once | OROMUCOSAL | Status: AC

## 2022-12-09 MED ORDER — EPHEDRINE 5 MG/ML INJ
INTRAVENOUS | Status: AC
  Filled 2022-12-09: qty 5

## 2022-12-09 MED ORDER — HEPARIN 6000 UNIT IRRIGATION SOLUTION
Status: AC
Start: 2022-12-09 — End: ?
  Filled 2022-12-09: qty 500

## 2022-12-09 MED ORDER — 0.9 % SODIUM CHLORIDE (POUR BTL) OPTIME
TOPICAL | Status: DC | PRN
  Administered 2022-12-09 (×2): 1000 mL

## 2022-12-09 MED ORDER — INSULIN ASPART 100 UNIT/ML IJ SOLN: 0.0000 [IU] | INTRAMUSCULAR | Status: DC | PRN

## 2022-12-09 MED ORDER — OXYCODONE HCL 5 MG PO TABS: 5.0000 mg | ORAL_TABLET | Freq: Once | ORAL | Status: DC | PRN

## 2022-12-09 MED ORDER — HEPARIN SODIUM (PORCINE) 1000 UNIT/ML IJ SOLN
INTRAMUSCULAR | Status: DC | PRN
  Administered 2022-12-09: 2000 [IU] via INTRAVENOUS

## 2022-12-09 MED ORDER — FENTANYL CITRATE (PF) 250 MCG/5ML IJ SOLN: INTRAMUSCULAR | Status: DC | PRN

## 2022-12-09 MED ORDER — CEFAZOLIN SODIUM-DEXTROSE 2-4 GM/100ML-% IV SOLN
2.0000 g | INTRAVENOUS | Status: AC
  Administered 2022-12-09: 2 g via INTRAVENOUS
  Filled 2022-12-09: qty 100

## 2022-12-09 MED ORDER — CHLORHEXIDINE GLUCONATE 0.12 % MT SOLN
15.0000 mL | Freq: Once | OROMUCOSAL | Status: AC
  Administered 2022-12-09: 15 mL via OROMUCOSAL
  Filled 2022-12-09: qty 15

## 2022-12-09 SURGICAL SUPPLY — 35 items
ADH SKN CLS APL DERMABOND .7 (GAUZE/BANDAGES/DRESSINGS) ×1
ARMBAND PINK RESTRICT EXTREMIT (MISCELLANEOUS) ×1 IMPLANT
BAG COUNTER SPONGE SURGICOUNT (BAG) ×1 IMPLANT
BAG SPNG CNTER NS LX DISP (BAG) ×1
BLADE CLIPPER SURG (BLADE) ×1 IMPLANT
BNDG ELASTIC 4X5.8 VLCR STR LF (GAUZE/BANDAGES/DRESSINGS) ×1 IMPLANT
CANISTER SUCT 3000ML PPV (MISCELLANEOUS) ×1 IMPLANT
CLIP TI MEDIUM 6 (CLIP) ×2 IMPLANT
CLIP TI WIDE RED SMALL 6 (CLIP) ×1 IMPLANT
COVER PROBE W GEL 5X96 (DRAPES) ×1 IMPLANT
DERMABOND ADVANCED .7 DNX12 (GAUZE/BANDAGES/DRESSINGS) ×1 IMPLANT
ELECT REM PT RETURN 9FT ADLT (ELECTROSURGICAL) ×1
ELECTRODE REM PT RTRN 9FT ADLT (ELECTROSURGICAL) ×1 IMPLANT
GLOVE BIOGEL PI IND STRL 8 (GLOVE) ×1 IMPLANT
GOWN STRL REUS W/ TWL LRG LVL3 (GOWN DISPOSABLE) ×2 IMPLANT
GOWN STRL REUS W/TWL 2XL LVL3 (GOWN DISPOSABLE) ×2 IMPLANT
GOWN STRL REUS W/TWL LRG LVL3 (GOWN DISPOSABLE) ×2
KIT BASIN OR (CUSTOM PROCEDURE TRAY) ×1 IMPLANT
KIT TURNOVER KIT B (KITS) ×1 IMPLANT
NS IRRIG 1000ML POUR BTL (IV SOLUTION) ×1 IMPLANT
PACK CV ACCESS (CUSTOM PROCEDURE TRAY) ×1 IMPLANT
PAD ARMBOARD 7.5X6 YLW CONV (MISCELLANEOUS) ×2 IMPLANT
SLING ARM FOAM STRAP LRG (SOFTGOODS) IMPLANT
SLING ARM FOAM STRAP MED (SOFTGOODS) IMPLANT
SPIKE FLUID TRANSFER (MISCELLANEOUS) ×1 IMPLANT
SUT MNCRL AB 4-0 PS2 18 (SUTURE) ×1 IMPLANT
SUT PROLENE 6 0 BV (SUTURE) ×1 IMPLANT
SUT PROLENE 7 0 BV 1 (SUTURE) IMPLANT
SUT SILK 2 0 SH (SUTURE) IMPLANT
SUT SILK 3 0 SH CR/8 (SUTURE) ×1 IMPLANT
SUT VIC AB 3-0 SH 27 (SUTURE) ×1
SUT VIC AB 3-0 SH 27X BRD (SUTURE) ×1 IMPLANT
TOWEL GREEN STERILE (TOWEL DISPOSABLE) ×1 IMPLANT
UNDERPAD 30X36 HEAVY ABSORB (UNDERPADS AND DIAPERS) ×1 IMPLANT
WATER STERILE IRR 1000ML POUR (IV SOLUTION) ×1 IMPLANT

## 2022-12-09 NOTE — Op Note (Signed)
NAME: Anthony French    MRN: [id number] DOB: 1955/09/14    DATE OF OPERATION: 12/09/2022  PREOP DIAGNOSIS:    Stage 4 chronic kidney disease  POSTOP DIAGNOSIS:    Same  PROCEDURE:    Left arm brachiocephalic fistula creation  SURGEON: Victorino Sparrow  ASSIST: Nathanial Rancher, PA  ANESTHESIA: Block, moderate  EBL: 5 mL  INDICATIONS:    Anthony French is a 67 y.o. male with chronic kidney disease stage IV in need of long-term HD access.  After discussing risk and benefits of left arm AV fistula creation,.  Elected to proceed.  FINDINGS:   4 mm cephalic vein 4 mm brachial artery  TECHNIQUE:   The patient was brought to the operating room and placed in supine position. The left arm was prepped and draped in standard fashion. IV antibiotics were administered. A timeout was performed.   The case began with ultrasound insonation of the brachial artery and cephalic vein, which demonstrated sufficient size at the antecubital fossa for arteriovenous fistula.   A transverse incision was made below the elbow creese in the antecubital fossa. The  cephalic vein was isolated for 3 cm in length. Next the aponeurosis was partially released and the brachial artery secured with a vessel loop. The patient was heparinized. The cephalic vein was transected and ligated distally with a 2-0 silk stick-tie. The vein was dilated with coronary dilators and flushed with heparin saline. Vascular clamps were placed proximally and distally on the brachial artery and a 5 mm arteriotomy was created on the brachial artery. This was flushed with heparin saline.  An anastomosis was created in end to side fashion on the brachial artery using running 6-0 Prolene suture.  Prior to completing the anastomosis, the vessels were flushed and the suture line was tied down. There was an excellent thrill in the cephalic vein from the anastomosis to the mid upper bicipital region. The patient had a palpable radial pulse. He had  an excellent thrill in the fistula. The incision was irrigated and hemostasis achieved with cautery and suture. The deeper tissue was closed with 3-0 Vicryl and the skin closed with 4-0 Monocryl.  Dermabond was applied to the incisions. The patient was transferred to PACU in stable condition.  Given the complexity of the case,  the assistant was necessary in order to expedient the procedure and safely perform the technical aspects of the operation.  The assistant provided traction and countertraction to assist with exposure of the artery and vein.  They also assisted with suture ligation of multiple venous branches.  They played a critical role in the anastomosis. These skills, especially following the Prolene suture for the anastomosis, could not have been adequately performed by a scrub tech assistant.    Victorino Sparrow, MD [facility] DATE OF DICTATION:   12/09/2022

## 2022-12-09 NOTE — Transfer of Care (Signed)
Immediate Anesthesia Transfer of Care Note  Patient: Anthony French  Procedure(s) Performed: LEFT ARM BRACHIOCEPHALIC ARTERIOVENOUS (AV) FISTULA CREATION (Left: Arm Upper)  Patient Location: PACU  Anesthesia Type:MAC and Regional  Level of Consciousness: awake, alert , and oriented  Airway & Oxygen Therapy: Patient Spontanous Breathing  Post-op Assessment: Report given to RN and Post -op Vital signs reviewed and stable  Post vital signs: Reviewed and stable  Last Vitals:  Vitals Value Taken Time  BP 100/63 12/09/22 0853  Temp    Pulse 74 12/09/22 0854  Resp 20 12/09/22 0854  SpO2 95 % 12/09/22 0854  Vitals shown include unfiled device data.  Last Pain:  Vitals:   12/09/22 0642  TempSrc:   PainSc: 0-No pain      Patients Stated Pain Goal: 0 (12/09/22 2130)  Complications: No notable events documented.

## 2022-12-09 NOTE — H&P (Signed)
Office Note     Patient seen and examined in preop holding.  No complaints. No changes to medication history or physical exam since last seen in clinic. After discussing the risks and benefits of left arm fistula, Anthony French elected to proceed.   Victorino Sparrow MD   CC:  ESRD Requesting Provider:  No ref. provider found  HPI: Anthony French is a Right handed 67 y.o. (1955/10/02) male with kidney disease who presents at the request of No ref. provider found for permanent HD access. The patient has had no prior access procedures.  On exam today, he was doing well, accompanied by his daughter.  A native of [facility], he has lived there his entire life.  His daughter lives in [place]..  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  He has had chronic kidney disease for a number of years which has progressed slowly.  He has been to the dialysis classes, and had very few questions regarding fistula creation.  No history of upper extremity surgeries.  No history of neuropathy.    Past Medical History:  Diagnosis Date   Diabetes (HCC)    Diabetic neuropathy (HCC)    Essential hypertension, benign 06/01/2019   Hyperlipidemia     Past Surgical History:  Procedure Laterality Date   APPENDECTOMY  1980   CYST REMOVAL TRUNK      Social History   Socioeconomic History   Marital status: Single    Spouse name: Not on file   Number of children: 3   Years of education: Not on file   Highest education level: Not on file  Occupational History   Occupation: retired  Tobacco Use   Smoking status: Never   Smokeless tobacco: Never  Vaping Use   Vaping status: Never Used  Substance and Sexual Activity   Alcohol use: Not Currently   Drug use: Never   Sexual activity: Not Currently  Other Topics Concern   Not on file  Social History Narrative   Not on file   Social  Determinants of Health   Financial Resource Strain: Low Risk  (04/07/2022)   Overall Financial Resource Strain (CARDIA)    Difficulty of Paying Living Expenses: Not hard at all  Food Insecurity: No Food Insecurity (04/07/2022)   Hunger Vital Sign    Worried About Running Out of Food in the Last Year: Never true    Ran Out of Food in the Last Year: Never true  Transportation Needs: No Transportation Needs (04/07/2022)   PRAPARE - Administrator, Civil Service (Medical): No    Lack of Transportation (Non-Medical): No  Physical Activity: Insufficiently Active (04/07/2022)   Exercise Vital Sign    Days of Exercise per Week: 3 days    Minutes of Exercise per Session: 30 min  Stress: No Stress Concern Present (04/07/2022)   [employer] of Occupational Health - Occupational Stress Questionnaire    Feeling of Stress : Not at all  Social Connections: Moderately Isolated (04/07/2022)   Social Connection and Isolation Panel [NHANES]    Frequency of Communication with Friends and Family: More than three times a week    Frequency of Social Gatherings with Friends and Family: More than three times a week    Attends Religious Services: More than 4 times per year    Active Member of Clubs or Organizations: No  Attends Banker Meetings: Never    Marital Status: Widowed  Intimate Partner Violence: Not At Risk (04/07/2022)   Humiliation, Afraid, Rape, and Kick questionnaire    Fear of Current or Ex-Partner: No    Emotionally Abused: No    Physically Abused: No    Sexually Abused: No   Family History  Problem Relation Age of Onset   Stroke Mother    Diabetes Mother    Hypertension Mother    Hyperlipidemia Mother    Kidney disease Mother    COPD Father    Diabetes Sister    Stroke Brother    Diabetes Brother    Arthritis Maternal Grandmother     Current Facility-Administered Medications  Medication Dose Route Frequency Provider Last Rate Last Admin   ceFAZolin  (ANCEF) IVPB 2g/100 mL premix  2 g Intravenous 30 min Pre-Op Victorino Sparrow, MD       chlorhexidine (HIBICLENS) 4 % liquid 4 Application  60 mL Topical Once Victorino Sparrow, MD       And   [START ON 12/10/2022] chlorhexidine (HIBICLENS) 4 % liquid 4 Application  60 mL Topical Once Victorino Sparrow, MD       insulin aspart (novoLOG) injection 0-14 Units  0-14 Units Subcutaneous Q2H PRN Mal Amabile, MD       sodium chloride flush (NS) 0.9 % injection 10 mL  10 mL Intravenous Q12H Victorino Sparrow, MD        Allergies  Allergen Reactions   Other Hives    Pt states that he is allergic to an ABT but he can not remember what it is   Sulfa Antibiotics Hives     REVIEW OF SYSTEMS:  [X]  denotes positive finding, [ ]  denotes negative finding Cardiac  Comments:  Chest pain or chest pressure:    Shortness of breath upon exertion:    Short of breath when lying flat:    Irregular heart rhythm:        Vascular    Pain in calf, thigh, or hip brought on by ambulation:    Pain in feet at night that wakes you up from your sleep:     Blood clot in your veins:    Leg swelling:         Pulmonary    Oxygen at home:    Productive cough:     Wheezing:         Neurologic    Sudden weakness in arms or legs:     Sudden numbness in arms or legs:     Sudden onset of difficulty speaking or slurred speech:    Temporary loss of vision in one eye:     Problems with dizziness:         Gastrointestinal    Blood in stool:     Vomited blood:         Genitourinary    Burning when urinating:     Blood in urine:        Psychiatric    Major depression:         Hematologic    Bleeding problems:    Problems with blood clotting too easily:        Skin    Rashes or ulcers:        Constitutional    Fever or chills:      PHYSICAL EXAMINATION:  Vitals:   12/09/22 0624  BP: 128/72  Pulse: (!) 56  Resp: 20  Temp: 97.6 F (36.4 C)  TempSrc: Oral  SpO2: 98%  Weight: 94.8 kg  Height: 5'  9" (1.753 m)    General:  WDWN in NAD; vital signs documented above Gait: Not observed HENT: WNL, normocephalic Pulmonary: normal non-labored breathing , without Rales, rhonchi,  wheezing Cardiac: regular HR Abdomen: soft, NT, no masses Skin: without rashes Vascular Exam/Pulses:  Right Left  Radial 2+ (normal) 2+ (normal)                       Extremities: without ischemic changes, without Gangrene , without cellulitis; without open wounds;  Musculoskeletal: no muscle wasting or atrophy  Neurologic: A&O X 3;  No focal weakness or paresthesias are detected Psychiatric:  The pt has Normal affect.   Non-Invasive Vascular Imaging:    +-----------------+-------------+----------+---------+  Right Cephalic   Diameter (cm)Depth (cm)Findings   +-----------------+-------------+----------+---------+  Prox upper arm       0.34        0.45              +-----------------+-------------+----------+---------+  Mid upper arm        0.47        0.31              +-----------------+-------------+----------+---------+  Dist upper arm       0.43        0.53              +-----------------+-------------+----------+---------+  Antecubital fossa    0.39        0.56   branching  +-----------------+-------------+----------+---------+  Prox forearm         0.35        0.29              +-----------------+-------------+----------+---------+  Mid forearm          0.37        0.25              +-----------------+-------------+----------+---------+  Dist forearm         0.18        0.25              +-----------------+-------------+----------+---------+   +-----------------+-------------+----------+---------+  Right Basilic    Diameter (cm)Depth (cm)Findings   +-----------------+-------------+----------+---------+  Prox upper arm       1.13        1.08              +-----------------+-------------+----------+---------+  Mid upper arm        0.42         1.38   branching  +-----------------+-------------+----------+---------+  Dist upper arm       0.50        1.13              +-----------------+-------------+----------+---------+  Antecubital fossa    0.27        0.44   branching  +-----------------+-------------+----------+---------+  Prox forearm         0.28        0.26              +-----------------+-------------+----------+---------+  Mid forearm          0.15        0.20              +-----------------+-------------+----------+---------+  Distal forearm       0.21        0.26              +-----------------+-------------+----------+---------+   +-----------------+-------------+----------+---------+  Left Cephalic    Diameter (cm)Depth (cm)Findings   +-----------------+-------------+----------+---------+  Prox upper arm       0.40        0.34              +-----------------+-------------+----------+---------+  Mid upper arm        0.46        0.37              +-----------------+-------------+----------+---------+  Dist upper arm       0.53        0.29              +-----------------+-------------+----------+---------+  Antecubital fossa    0.47        0.33   branching  +-----------------+-------------+----------+---------+  Prox forearm         0.34        0.27              +-----------------+-------------+----------+---------+  Mid forearm          0.35        0.29              +-----------------+-------------+----------+---------+  Dist forearm         0.38        0.19              +-----------------+-------------+----------+---------+   +-----------------+-------------+----------+---------+  Left Basilic     Diameter (cm)Depth (cm)Findings   +-----------------+-------------+----------+---------+  Prox upper arm       0.92        0.73              +-----------------+-------------+----------+---------+  Mid upper arm        0.33         1.02              +-----------------+-------------+----------+---------+  Dist upper arm       0.61        0.44   branching  +-----------------+-------------+----------+---------+  Antecubital fossa    0.23        0.22              +-----------------+-------------+----------+---------+  Prox forearm         0.20        0.14              +-----------------+-------------+----------+---------+  Mid forearm          0.12        0.20              +-----------------+-------------+----------+---------+  Distal forearm       0.20        0.24              +-----------------+-------------+----------+---------+         ASSESSMENT/PLAN:  Anthony French is a 67 y.o. male who presents with chronic kidney disease stage 4  Based on vein mapping and examination, patient is a candidate for fistula creation and bilateral upper extremities I had a long discussion with him regarding the above.  Being that he is right-handed, I think he would be best served with left-sided AV fistula creation.  My plan would be to perform the fistula creation in the left antecubital fossa, however I plan to look at his left radial and cephalic artery and veins respectively at the level of the wrist as they appear sizable on recent duplex ultrasound. I had an extensive discussion with this patient in regards to the nature  of access surgery, including risk, benefits, and alternatives.   The patient is aware that the risks of access surgery include but are not limited to: bleeding, infection, steal syndrome, nerve damage, ischemic monomelic neuropathy, failure of access to mature, complications related to venous hypertension, and possible need for additional access procedures in the future. The patient has agreed to proceed with the above procedure which will be scheduled.  Victorino Sparrow, MD [facility] [phone]

## 2022-12-09 NOTE — Anesthesia Postprocedure Evaluation (Signed)
Anesthesia Post Note  Patient: Anthony French  Procedure(s) Performed: LEFT ARM BRACHIOCEPHALIC ARTERIOVENOUS (AV) FISTULA CREATION (Left: Arm Upper)     Patient location during evaluation: PACU Anesthesia Type: Regional and MAC Level of consciousness: awake and alert and oriented Pain management: pain level controlled Vital Signs Assessment: post-procedure vital signs reviewed and stable Respiratory status: spontaneous breathing, nonlabored ventilation and respiratory function stable Cardiovascular status: stable and blood pressure returned to baseline Postop Assessment: no apparent nausea or vomiting Anesthetic complications: no   No notable events documented.  Last Vitals:  Vitals:   12/09/22 0900 12/09/22 0915  BP: 104/67 116/69  Pulse: 70 60  Resp: 15 12  Temp:    SpO2: 92% 94%    Last Pain:  Vitals:   12/09/22 0915  TempSrc:   PainSc: 0-No pain                 Li Bobo A.

## 2022-12-09 NOTE — Discharge Instructions (Signed)
[facility]  Discharge Instructions  AV Fistula or Graft Surgery for Dialysis Access  Please refer to the following instructions for your post-procedure care. Your surgeon or physician assistant will discuss any changes with you.  Activity  You may drive the day following your surgery, if you are comfortable and no longer taking prescription pain medication. Resume full activity as the soreness in your incision resolves.  Bathing/Showering  You may shower after you go home. Keep your incision dry for 48 hours. Do not soak in a bathtub, hot tub, or swim until the incision heals completely. You may not shower if you have a hemodialysis catheter.  Incision Care  Clean your incision with mild soap and water after 48 hours. Pat the area dry with a clean towel. You do not need a bandage unless otherwise instructed. Do not apply any ointments or creams to your incision. You may have skin glue on your incision. Do not peel it off. It will come off on its own in about one week. Your arm may swell a bit after surgery. To reduce swelling use pillows to elevate your arm so it is above your heart. Your doctor will tell you if you need to lightly wrap your arm with an ACE bandage.  Diet  Resume your normal diet. There are not special food restrictions following this procedure. In order to heal from your surgery, it is CRITICAL to get adequate nutrition. Your body requires vitamins, minerals, and protein. Vegetables are the best source of vitamins and minerals. Vegetables also provide the perfect balance of protein. Processed food has little nutritional value, so try to avoid this.  Medications  Resume taking all of your medications. If your incision is causing pain, you may take over-the counter pain relievers such as acetaminophen (Tylenol). If you were prescribed a stronger pain medication, please be aware these medications can cause nausea and constipation. Prevent  nausea by taking the medication with a snack or meal. Avoid constipation by drinking plenty of fluids and eating foods with high amount of fiber, such as fruits, vegetables, and grains.  Do not take Tylenol if you are taking prescription pain medications.  Follow up Your surgeon may want to see you in the office following your access surgery. If so, this will be arranged at the time of your surgery.  Please call us immediately for any of the following conditions:  Increased pain, redness, drainage (pus) from your incision site Fever of 101 degrees or higher Severe or worsening pain at your incision site Hand pain or numbness.  Reduce your risk of vascular disease:  Stop smoking. If you would like help, call [facility] at 1-800-QUIT-NOW (959-794-7352) or [place] at 5024756956  Manage your cholesterol Maintain a desired weight Control your diabetes Keep your blood pressure down  Dialysis  It will take several weeks to several months for your new dialysis access to be ready for use. Your surgeon will determine when it is okay to use it. Your nephrologist will continue to direct your dialysis. You can continue to use your Permcath until your new access is ready for use.   12/09/2022 Anthony French [id number] [birth year]  Surgeon(s): Victorino Sparrow, MD  Procedure(s): LEFT ARM BRACHIOCEPHALIC ARTERIOVENOUS (AV) FISTULA CREATION   May stick graft immediately   May stick graft on designated area only:   X Do not stick left AV fistula for 12 weeks    If you have any questions, please call the office  at [phone].

## 2022-12-09 NOTE — Anesthesia Procedure Notes (Signed)
Anesthesia Regional Block: Interscalene brachial plexus block   Pre-Anesthetic Checklist: , timeout performed,  Correct Patient, Correct Site, Correct Laterality,  Correct Procedure, Correct Position, site marked,  Risks and benefits discussed,  Surgical consent,  Pre-op evaluation,  At surgeon's request  Laterality: Left  Prep: chloraprep       Needles:  Injection technique: Single-shot  Needle Type: Echogenic Stimulator Needle     Needle Length: 10cm  Needle Gauge: 21   Needle insertion depth: 6 cm   Additional Needles:     Motor weakness within 5 minutes.  Narrative:  Start time: 12/09/2022 7:23 AM End time: 12/09/2022 7:28 AM Injection made incrementally with aspirations every 5 mL.  Performed by: Personally  Anesthesiologist: Mal Amabile, MD  Additional Notes: Timeout performed. Patient sedated. Relevant anatomy ID'd using [place]. Incremental 2-37ml injection of LA with frequent aspiration. Patient tolerated procedure well.     Left Interscalene Block

## 2022-12-10 ENCOUNTER — Encounter (HOSPITAL_COMMUNITY): Payer: Self-pay | Admitting: Vascular Surgery

## 2022-12-11 DIAGNOSIS — H43823 Vitreomacular adhesion, bilateral: Secondary | ICD-10-CM | POA: Diagnosis not present

## 2022-12-11 DIAGNOSIS — E113313 Type 2 diabetes mellitus with moderate nonproliferative diabetic retinopathy with macular edema, bilateral: Secondary | ICD-10-CM | POA: Diagnosis not present

## 2022-12-11 DIAGNOSIS — H2513 Age-related nuclear cataract, bilateral: Secondary | ICD-10-CM | POA: Diagnosis not present

## 2022-12-11 DIAGNOSIS — H35033 Hypertensive retinopathy, bilateral: Secondary | ICD-10-CM | POA: Diagnosis not present

## 2022-12-11 DIAGNOSIS — Z961 Presence of intraocular lens: Secondary | ICD-10-CM | POA: Diagnosis not present

## 2022-12-15 ENCOUNTER — Encounter: Payer: Self-pay | Admitting: Nurse Practitioner

## 2022-12-19 ENCOUNTER — Ambulatory Visit: Payer: 59 | Admitting: Podiatry

## 2022-12-26 ENCOUNTER — Encounter: Payer: Self-pay | Admitting: Podiatry

## 2022-12-26 ENCOUNTER — Ambulatory Visit (INDEPENDENT_AMBULATORY_CARE_PROVIDER_SITE_OTHER): Payer: 59 | Admitting: Podiatry

## 2022-12-26 DIAGNOSIS — E1142 Type 2 diabetes mellitus with diabetic polyneuropathy: Secondary | ICD-10-CM | POA: Diagnosis not present

## 2022-12-26 DIAGNOSIS — B351 Tinea unguium: Secondary | ICD-10-CM

## 2022-12-26 DIAGNOSIS — M79609 Pain in unspecified limb: Secondary | ICD-10-CM

## 2022-12-26 NOTE — Progress Notes (Signed)
Subjective: Anthony French is a 67 y.o. male patient with history of diabetes who presents to office today with a chief concern of long,mildly painful nails  while ambulating in shoes; unable to trim.  Patient denies any new cramping, numbness, burning or tingling in the legs. His last HgA1C was 7.6 on 11/20/22 Patient denies any new changes in medication or new problems.  Referring Provider: [name], DO   Patient Active Problem List   Diagnosis Date Noted   Nodular basal cell carcinoma (BCC) 10/28/2021   Cough 03/14/2020   Nausea 03/14/2020   Sore throat 03/14/2020   Type 2 diabetes mellitus with stage 3b chronic kidney disease, with long-term current use of insulin (HCC) 06/01/2019   Mixed hyperlipidemia 06/01/2019   Essential hypertension, benign 06/01/2019   Thrombocytopenia (HCC) 05/11/2019   Diabetic retinopathy (HCC) 11/06/2017   Acute hypoxemic respiratory failure (HCC) 07/11/2017   Pneumonia of right lower lobe due to Haemophilus influenzae (HCC) 07/11/2017   Sepsis (HCC) 07/11/2017   CKD stage 3 due to type 2 diabetes mellitus (HCC) 06/17/2017   Actinic keratosis 04/16/2017   Diabetic polyneuropathy associated with type 2 diabetes mellitus (HCC) 03/13/2017   Foot callus 03/13/2017   Trigger middle finger of left hand 03/13/2017   Diabetes mellitus (HCC) 03/27/2015   Hyperlipidemia associated with type 2 diabetes mellitus (HCC) 03/25/2011   Dyslipidemia 03/25/2011   Current Outpatient Medications on File Prior to Visit  Medication Sig Dispense Refill   blood glucose meter kit and supplies Dispense based on patient and insurance preference. Use up to four times daily as directed. (FOR ICD-10 E10.9, E11.9). Check blood sugar twice a day as directed. 1 each 0   calcitRIOL (ROCALTROL) 0.25 MCG capsule Take 0.25 mcg by mouth every Monday, Wednesday, and Friday.     chlorthalidone (HYGROTON) 25 MG tablet Take 12.5 mg by mouth daily.     Continuous Glucose Receiver  (FREESTYLE LIBRE 3 READER) DEVI 1 each by Does not apply route in the morning, at noon, in the evening, and at bedtime. Use to check BGs E11.9 1 each 0   Continuous Glucose Sensor (FREESTYLE LIBRE 3 SENSOR) MISC Place 1 sensor on the skin every 14 days. Use to check glucose continuously E11.9 6 each 0   gabapentin (NEURONTIN) 300 MG capsule TAKE 1 CAPSULE BY MOUTH IN THE  MORNING AND TAKE 3 CAPSULES BY  MOUTH IN THE EVENING (Patient taking differently: Take 300-600 mg by mouth See admin instructions. TAKE 1 CAPSULE BY MOUTH IN THE MORNING AND TAKE 2 CAPSULES BY MOUTH IN THE EVENING) 360 capsule 0   glucose blood (ACCU-CHEK GUIDE) test strip Test blood sugar 4 times per day, before meals and at bedtime. 500 strip 3   insulin NPH-regular Human (70-30) 100 UNIT/ML injection Inject 15-20 Units into the skin See admin instructions. Inject 20 units before breakfast and 15 units before supper     Insulin Pen Needle (PEN NEEDLES) 31G X 8 MM MISC 1 each by Does not apply route in the morning and at bedtime. 100 each 11   Insulin Syringe-Needle U-100 (BD INSULIN SYRINGE U/F) 31G X 5/16" 0.3 ML MISC USE WITH INSULIN TWICE  DAILY DX E11.22 200 each 3   levothyroxine (SYNTHROID) 50 MCG tablet Take 1 tablet (50 mcg total) by mouth daily before breakfast. 100 tablet 2   Multiple Vitamin (MULTIVITAMIN WITH MINERALS) TABS tablet Take 1 tablet by mouth daily.     NIFEdipine (ADALAT CC) 60 MG 24 hr tablet  Take 60 mg by mouth 2 (two) times daily.     oxyCODONE-acetaminophen (PERCOCET) 5-325 MG tablet Take 1 tablet by mouth every 6 (six) hours as needed for severe pain (pain score 7-10). 10 tablet 0   rizatriptan (MAXALT-MLT) 10 MG disintegrating tablet TAKE 1 TABLET BY MOUTH AS NEEDED FOR MIGRAINE. MAY REPEAT IN 2 HOURS IF NEEDED 10 tablet 2   sodium bicarbonate 650 MG tablet Take 650 mg by mouth 3 (three) times daily.     sodium zirconium cyclosilicate (LOKELMA) 10 g PACK packet Take 10 g by mouth daily.     No current  facility-administered medications on file prior to visit.   Allergies  Allergen Reactions   Other Hives    Pt states that he is allergic to an ABT but he can not remember what it is   Sulfa Antibiotics Hives      Objective: General: Patient is awake, alert, and oriented x 3 and in no acute distress.  Integument: Skin is warm, dry and supple bilateral. Nails are tender, long, thickened and  dystrophic with subungual debris, consistent with onychomycosis 1 left. Remainder of nails are elongated. No signs of infection. No open lesions or preulcerative lesions present bilateral. Remaining integument unremarkable.  Vasculature:  Dorsalis Pedis pulse 2/4 bilateral. Posterior Tibial pulse  1/4 bilateral.  Capillary fill time <3 sec 1-5 bilateral. Temperature gradient within normal limits. no varicosities present bilateral. no edema present bilateral.   Neurology: The patient has intact sensation measured with a 5.07/10g Semmes Weinstein Monofilament at  5/5 pedal sites bilateral . Vibratory sensation diminished bilateral with tuning fork. No Babinski sign present bilateral.   Musculoskeletal: No symptomatic pedal deformities noted bilateral. Muscular strength 5/5 in all lower extremity muscular groups bilateral without pain on range of motion . No tenderness with calf compression bilateral.  Assessment and Plan:   ICD-10-CM   1. Pain due to onychomycosis of nail  B35.1    M79.609     2. Diabetic polyneuropathy associated with type 2 diabetes mellitus (HCC)  E11.42         -Examined patient. -Mechanically debrided all nails 1-5 bilateral using sterile nail nipper and filed with dremel without incident  -Answered all patient questions -Patient to return  in 3 months for at risk foot care -Patient advised to call the office if any problems or questions arise in the meantime.  Louann Sjogren, DPM

## 2023-01-05 ENCOUNTER — Other Ambulatory Visit: Payer: Self-pay | Admitting: *Deleted

## 2023-01-05 DIAGNOSIS — N184 Chronic kidney disease, stage 4 (severe): Secondary | ICD-10-CM

## 2023-01-21 NOTE — Progress Notes (Unsigned)
Office Note     CC:  ESRD Requesting Provider:  [name], DO  HPI: Anthony French is a Right handed 67 y.o. ([birth year]) male with kidney disease who presents at the request of [name], DO for permanent HD access. The patient has had no prior access procedures.  On exam today, he was doing well, accompanied by his daughter.  A native of [facility], he has lived there his entire life.  His daughter lives in [place]..  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  He has had chronic kidney disease for a number of years which has progressed slowly.  He has been to the dialysis classes, and had very few questions regarding fistula creation.  No history of upper extremity surgeries.  No history of neuropathy.    Past Medical History:  Diagnosis Date   Diabetes (HCC)    Diabetic neuropathy (HCC)    Essential hypertension, benign 06/01/2019   Hyperlipidemia     Past Surgical History:  Procedure Laterality Date   APPENDECTOMY  1980   AV FISTULA PLACEMENT Left 12/09/2022   Procedure: LEFT ARM BRACHIOCEPHALIC ARTERIOVENOUS (AV) FISTULA CREATION;  Surgeon: Victorino Sparrow, MD;  Location: [facility] OR;  Service: Vascular;  Laterality: Left;   CYST REMOVAL TRUNK      Social History   Socioeconomic History   Marital status: Single    Spouse name: Not on file   Number of children: 3   Years of education: Not on file   Highest education level: Not on file  Occupational History   Occupation: retired  Tobacco Use   Smoking status: Never   Smokeless tobacco: Never  Vaping Use   Vaping status: Never Used  Substance and Sexual Activity   Alcohol use: Not Currently   Drug use: Never   Sexual activity: Not Currently  Other Topics Concern   Not on file  Social History Narrative   Not on file   Social Determinants of Health   Financial Resource Strain: Low  Risk  (04/07/2022)   Overall Financial Resource Strain (CARDIA)    Difficulty of Paying Living Expenses: Not hard at all  Food Insecurity: No Food Insecurity (04/07/2022)   Hunger Vital Sign    Worried About Running Out of Food in the Last Year: Never true    Ran Out of Food in the Last Year: Never true  Transportation Needs: No Transportation Needs (04/07/2022)   PRAPARE - Administrator, Civil Service (Medical): No    Lack of Transportation (Non-Medical): No  Physical Activity: Insufficiently Active (04/07/2022)   Exercise Vital Sign    Days of Exercise per Week: 3 days    Minutes of Exercise per Session: 30 min  Stress: No Stress Concern Present (04/07/2022)   [employer] of Occupational Health - Occupational Stress Questionnaire    Feeling of Stress : Not at all  Social Connections: Moderately Isolated (04/07/2022)   Social Connection and Isolation Panel [NHANES]    Frequency of Communication with Friends and Family: More than three times a week    Frequency of Social Gatherings with Friends and Family: More than three times a week    Attends Religious Services: More than 4 times per year    Active Member of [employer] or Organizations: No    Attends Banker Meetings: Never    Marital Status: Widowed  Intimate Partner Violence: Not At Risk (04/07/2022)   Humiliation, Afraid, Rape, and Kick questionnaire    Fear of Current or Ex-Partner: No    Emotionally Abused: No    Physically Abused: No    Sexually Abused: No   Family History  Problem Relation Age of Onset   Stroke Mother    Diabetes Mother    Hypertension Mother    Hyperlipidemia Mother    Kidney disease Mother    COPD Father    Diabetes Sister    Stroke Brother    Diabetes Brother    Arthritis Maternal Grandmother     Current Outpatient Medications  Medication Sig Dispense Refill   blood glucose meter kit and supplies Dispense based on patient and insurance preference. Use up to four times  daily as directed. (FOR ICD-10 E10.9, E11.9). Check blood sugar twice a day as directed. 1 each 0   calcitRIOL (ROCALTROL) 0.25 MCG capsule Take 0.25 mcg by mouth every Monday, Wednesday, and Friday.     chlorthalidone (HYGROTON) 25 MG tablet Take 12.5 mg by mouth daily.     Continuous Glucose Receiver (FREESTYLE LIBRE 3 READER) DEVI 1 each by Does not apply route in the morning, at noon, in the evening, and at bedtime. Use to check BGs E11.9 1 each 0   Continuous Glucose Sensor (FREESTYLE LIBRE 3 SENSOR) MISC Place 1 sensor on the skin every 14 days. Use to check glucose continuously E11.9 6 each 0   gabapentin (NEURONTIN) 300 MG capsule TAKE 1 CAPSULE BY MOUTH IN THE  MORNING AND TAKE 3 CAPSULES BY  MOUTH IN THE EVENING (Patient taking differently: Take 300-600 mg by mouth See admin instructions. TAKE 1 CAPSULE BY MOUTH IN THE MORNING AND TAKE 2 CAPSULES BY MOUTH IN THE EVENING) 360 capsule 0   glucose blood (ACCU-CHEK GUIDE) test strip Test blood sugar 4 times per day, before meals and at bedtime. 500 strip 3   insulin NPH-regular Human (70-30) 100 UNIT/ML injection Inject 15-20 Units into the skin See admin instructions. Inject 20 units before breakfast and 15 units before supper     Insulin Pen Needle (PEN NEEDLES) 31G X 8 MM MISC 1 each by Does not apply route in the morning and at bedtime. 100 each 11   Insulin Syringe-Needle U-100 (BD INSULIN SYRINGE U/F) 31G X 5/16" 0.3 ML MISC USE WITH INSULIN TWICE  DAILY DX E11.22 200 each 3   levothyroxine (SYNTHROID) 50 MCG tablet Take 1 tablet (50 mcg total) by mouth daily before breakfast. 100 tablet 2   Multiple Vitamin (MULTIVITAMIN WITH MINERALS) TABS tablet Take 1 tablet by mouth daily.     NIFEdipine (ADALAT CC) 60 MG 24 hr tablet Take 60 mg by mouth 2 (two) times daily.     oxyCODONE-acetaminophen (PERCOCET) 5-325 MG tablet Take 1 tablet by mouth every 6 (six) hours as needed for severe pain (pain score 7-10). 10 tablet 0   rizatriptan  (MAXALT-MLT) 10 MG disintegrating tablet TAKE 1 TABLET BY MOUTH AS NEEDED FOR MIGRAINE. MAY REPEAT IN 2 HOURS IF NEEDED 10 tablet 2   sodium bicarbonate 650 MG tablet Take 650 mg by mouth 3 (three) times daily.     sodium zirconium cyclosilicate (LOKELMA) 10 g PACK packet Take 10 g by mouth daily.     No current facility-administered medications for this visit.    Allergies  Allergen Reactions   Other Hives    Pt states that he is allergic to an ABT but he  can not remember what it is   Sulfa Antibiotics Hives     REVIEW OF SYSTEMS:  [X]  denotes positive finding, [ ]  denotes negative finding Cardiac  Comments:  Chest pain or chest pressure:    Shortness of breath upon exertion:    Short of breath when lying flat:    Irregular heart rhythm:        Vascular    Pain in calf, thigh, or hip brought on by ambulation:    Pain in feet at night that wakes you up from your sleep:     Blood clot in your veins:    Leg swelling:         Pulmonary    Oxygen at home:    Productive cough:     Wheezing:         Neurologic    Sudden weakness in arms or legs:     Sudden numbness in arms or legs:     Sudden onset of difficulty speaking or slurred speech:    Temporary loss of vision in one eye:     Problems with dizziness:         Gastrointestinal    Blood in stool:     Vomited blood:         Genitourinary    Burning when urinating:     Blood in urine:        Psychiatric    Major depression:         Hematologic    Bleeding problems:    Problems with blood clotting too easily:        Skin    Rashes or ulcers:        Constitutional    Fever or chills:      PHYSICAL EXAMINATION:  There were no vitals filed for this visit.  General:  WDWN in NAD; vital signs documented above Gait: Not observed HENT: WNL, normocephalic Pulmonary: normal non-labored breathing , without Rales, rhonchi,  wheezing Cardiac: regular HR Abdomen: soft, NT, no masses Skin: without  rashes Vascular Exam/Pulses:  Right Left  Radial 2+ (normal) 2+ (normal)                       Extremities: without ischemic changes, without Gangrene , without cellulitis; without open wounds;  Musculoskeletal: no muscle wasting or atrophy  Neurologic: A&O X 3;  No focal weakness or paresthesias are detected Psychiatric:  The pt has Normal affect.   Non-Invasive Vascular Imaging:    +-----------------+-------------+----------+---------+  Right Cephalic   Diameter (cm)Depth (cm)Findings   +-----------------+-------------+----------+---------+  Prox upper arm       0.34        0.45              +-----------------+-------------+----------+---------+  Mid upper arm        0.47        0.31              +-----------------+-------------+----------+---------+  Dist upper arm       0.43        0.53              +-----------------+-------------+----------+---------+  Antecubital fossa    0.39        0.56   branching  +-----------------+-------------+----------+---------+  Prox forearm         0.35        0.29              +-----------------+-------------+----------+---------+  Mid forearm          0.37        0.25              +-----------------+-------------+----------+---------+  Dist forearm         0.18        0.25              +-----------------+-------------+----------+---------+   +-----------------+-------------+----------+---------+  Right Basilic    Diameter (cm)Depth (cm)Findings   +-----------------+-------------+----------+---------+  Prox upper arm       1.13        1.08              +-----------------+-------------+----------+---------+  Mid upper arm        0.42        1.38   branching  +-----------------+-------------+----------+---------+  Dist upper arm       0.50        1.13              +-----------------+-------------+----------+---------+  Antecubital fossa    0.27        0.44   branching   +-----------------+-------------+----------+---------+  Prox forearm         0.28        0.26              +-----------------+-------------+----------+---------+  Mid forearm          0.15        0.20              +-----------------+-------------+----------+---------+  Distal forearm       0.21        0.26              +-----------------+-------------+----------+---------+   +-----------------+-------------+----------+---------+  Left Cephalic    Diameter (cm)Depth (cm)Findings   +-----------------+-------------+----------+---------+  Prox upper arm       0.40        0.34              +-----------------+-------------+----------+---------+  Mid upper arm        0.46        0.37              +-----------------+-------------+----------+---------+  Dist upper arm       0.53        0.29              +-----------------+-------------+----------+---------+  Antecubital fossa    0.47        0.33   branching  +-----------------+-------------+----------+---------+  Prox forearm         0.34        0.27              +-----------------+-------------+----------+---------+  Mid forearm          0.35        0.29              +-----------------+-------------+----------+---------+  Dist forearm         0.38        0.19              +-----------------+-------------+----------+---------+   +-----------------+-------------+----------+---------+  Left Basilic     Diameter (cm)Depth (cm)Findings   +-----------------+-------------+----------+---------+  Prox upper arm       0.92        0.73              +-----------------+-------------+----------+---------+  Mid upper arm        0.33  1.02              +-----------------+-------------+----------+---------+  Dist upper arm       0.61        0.44   branching  +-----------------+-------------+----------+---------+  Antecubital fossa    0.23        0.22               +-----------------+-------------+----------+---------+  Prox forearm         0.20        0.14              +-----------------+-------------+----------+---------+  Mid forearm          0.12        0.20              +-----------------+-------------+----------+---------+  Distal forearm       0.20        0.24              +-----------------+-------------+----------+---------+         ASSESSMENT/PLAN:  Anthony French is a 67 y.o. male who presents with chronic kidney disease stage 4  Based on vein mapping and examination, patient is a candidate for fistula creation and bilateral upper extremities I had a long discussion with him regarding the above.  Being that he is right-handed, I think he would be best served with left-sided AV fistula creation.  My plan would be to perform the fistula creation in the left antecubital fossa, however I plan to look at his left radial and cephalic artery and veins respectively at the level of the wrist as they appear sizable on recent duplex ultrasound. I had an extensive discussion with this patient in regards to the nature of access surgery, including risk, benefits, and alternatives.   The patient is aware that the risks of access surgery include but are not limited to: bleeding, infection, steal syndrome, nerve damage, ischemic monomelic neuropathy, failure of access to mature, complications related to venous hypertension, and possible need for additional access procedures in the future. The patient has agreed to proceed with the above procedure which will be scheduled.  Victorino Sparrow, MD [facility] [phone]

## 2023-01-22 ENCOUNTER — Ambulatory Visit (INDEPENDENT_AMBULATORY_CARE_PROVIDER_SITE_OTHER): Payer: 59 | Admitting: Physician Assistant

## 2023-01-22 ENCOUNTER — Ambulatory Visit (HOSPITAL_COMMUNITY)
Admission: RE | Admit: 2023-01-22 | Discharge: 2023-01-22 | Disposition: A | Discharge status: Home / Self Care | Payer: 59 | Source: Ambulatory Visit | Attending: Vascular Surgery | Admitting: Vascular Surgery

## 2023-01-22 VITALS — BP 152/91 | HR 56 | Temp 98.1°F | Resp 20 | Ht 69.0 in | Wt 201.0 lb

## 2023-01-22 DIAGNOSIS — N184 Chronic kidney disease, stage 4 (severe): Secondary | ICD-10-CM | POA: Diagnosis not present

## 2023-01-22 DIAGNOSIS — Z1211 Encounter for screening for malignant neoplasm of colon: Secondary | ICD-10-CM | POA: Diagnosis not present

## 2023-01-28 ENCOUNTER — Other Ambulatory Visit: Payer: Self-pay | Admitting: *Deleted

## 2023-01-28 DIAGNOSIS — Z794 Long term (current) use of insulin: Secondary | ICD-10-CM

## 2023-01-28 DIAGNOSIS — I152 Hypertension secondary to endocrine disorders: Secondary | ICD-10-CM

## 2023-01-28 DIAGNOSIS — E785 Hyperlipidemia, unspecified: Secondary | ICD-10-CM

## 2023-01-28 LAB — COLOGUARD: COLOGUARD: NEGATIVE

## 2023-02-06 DIAGNOSIS — E113313 Type 2 diabetes mellitus with moderate nonproliferative diabetic retinopathy with macular edema, bilateral: Secondary | ICD-10-CM | POA: Diagnosis not present

## 2023-02-12 ENCOUNTER — Other Ambulatory Visit: Payer: 59

## 2023-02-13 LAB — T4, FREE: Free T4: 1.25 ng/dL (ref 0.82–1.77)

## 2023-02-13 LAB — TSH: TSH: 3.82 u[IU]/mL (ref 0.450–4.500)

## 2023-02-20 ENCOUNTER — Encounter: Payer: Self-pay | Admitting: Nurse Practitioner

## 2023-02-20 ENCOUNTER — Ambulatory Visit (INDEPENDENT_AMBULATORY_CARE_PROVIDER_SITE_OTHER): Payer: Medicare Other | Admitting: Nurse Practitioner

## 2023-02-20 VITALS — BP 146/74 | HR 67 | Ht 69.0 in | Wt 204.2 lb

## 2023-02-20 DIAGNOSIS — N184 Chronic kidney disease, stage 4 (severe): Secondary | ICD-10-CM | POA: Diagnosis not present

## 2023-02-20 DIAGNOSIS — E559 Vitamin D deficiency, unspecified: Secondary | ICD-10-CM

## 2023-02-20 DIAGNOSIS — Z794 Long term (current) use of insulin: Secondary | ICD-10-CM | POA: Diagnosis not present

## 2023-02-20 DIAGNOSIS — E1122 Type 2 diabetes mellitus with diabetic chronic kidney disease: Secondary | ICD-10-CM

## 2023-02-20 DIAGNOSIS — I10 Essential (primary) hypertension: Secondary | ICD-10-CM

## 2023-02-20 DIAGNOSIS — E782 Mixed hyperlipidemia: Secondary | ICD-10-CM | POA: Diagnosis not present

## 2023-02-20 DIAGNOSIS — E039 Hypothyroidism, unspecified: Secondary | ICD-10-CM

## 2023-02-20 LAB — POCT GLYCOSYLATED HEMOGLOBIN (HGB A1C): Hemoglobin A1C: 6.8 % — AB (ref 4.0–5.6)

## 2023-02-20 MED ORDER — LEVOTHYROXINE SODIUM 50 MCG PO TABS: 50.0000 ug | ORAL_TABLET | Freq: Every day | ORAL | 3 refills | Status: DC

## 2023-02-20 MED ORDER — INSULIN SYRINGE-NEEDLE U-100 31G X 5/16" 0.3 ML MISC: 3 refills | Status: DC

## 2023-02-20 NOTE — Progress Notes (Signed)
 02/20/2023, 11:45 AM  Endocrinology follow-up note   Subjective:    Patient ID: Anthony French, male    DOB: Mar 15, 1955.  Anthony French is being seen in follow-up after he was seen in consultation for management of currently uncontrolled symptomatic diabetes requested by  [name], DO.   Past Medical History:  Diagnosis Date   Diabetes (HCC)    Diabetic neuropathy (HCC)    Essential hypertension, benign 06/01/2019   Hyperlipidemia     Past Surgical History:  Procedure Laterality Date   APPENDECTOMY  1980   AV FISTULA PLACEMENT Left 12/09/2022   Procedure: LEFT ARM BRACHIOCEPHALIC ARTERIOVENOUS (AV) FISTULA CREATION;  Surgeon: Lanis Fonda BRAVO, MD;  Location: [facility] OR;  Service: Vascular;  Laterality: Left;   CYST REMOVAL TRUNK      Social History   Socioeconomic History   Marital status: Single    Spouse name: Not on file   Number of children: 3   Years of education: Not on file   Highest education level: Not on file  Occupational History   Occupation: retired  Tobacco Use   Smoking status: Never   Smokeless tobacco: Never  Vaping Use   Vaping status: Never Used  Substance and Sexual Activity   Alcohol use: Not Currently   Drug use: Never   Sexual activity: Not Currently  Other Topics Concern   Not on file  Social History Narrative   Not on file   Social Drivers of Health   Financial Resource Strain: Low Risk  (04/07/2022)   Overall Financial Resource Strain (CARDIA)    Difficulty of Paying Living Expenses: Not hard at all  Food Insecurity: No Food Insecurity (04/07/2022)   Hunger Vital Sign    Worried About Running Out of Food in the Last Year: Never true    Ran Out of Food in the Last Year: Never true  Transportation Needs: No Transportation Needs (04/07/2022)   PRAPARE - Administrator, Civil Service (Medical): No    Lack of Transportation (Non-Medical): No  Physical Activity: Insufficiently Active (04/07/2022)    Exercise Vital Sign    Days of Exercise per Week: 3 days    Minutes of Exercise per Session: 30 min  Stress: No Stress Concern Present (04/07/2022)   [employer] of Occupational Health - Occupational Stress Questionnaire    Feeling of Stress : Not at all  Social Connections: Moderately Isolated (04/07/2022)   Social Connection and Isolation Panel [NHANES]    Frequency of Communication with Friends and Family: More than three times a week    Frequency of Social Gatherings with Friends and Family: More than three times a week    Attends Religious Services: More than 4 times per year    Active Member of [employer] or Organizations: No    Attends Banker Meetings: Never    Marital Status: Widowed    Family History  Problem Relation Age of Onset   Stroke Mother    Diabetes Mother    Hypertension Mother    Hyperlipidemia Mother    Kidney disease Mother    COPD Father    Diabetes Sister    Stroke Brother    Diabetes Brother    Arthritis Maternal Grandmother     Outpatient Encounter Medications as of 02/20/2023  Medication Sig   blood glucose meter kit and supplies Dispense based on patient and insurance preference. Use up to four times daily as directed. (FOR  ICD-10 E10.9, E11.9). Check blood sugar twice a day as directed.   calcitRIOL (ROCALTROL) 0.25 MCG capsule Take 0.25 mcg by mouth every Monday, Wednesday, and Friday.   chlorthalidone (HYGROTON) 25 MG tablet Take 12.5 mg by mouth daily.   Continuous Glucose Receiver (FREESTYLE LIBRE 3 READER) DEVI 1 each by Does not apply route in the morning, at noon, in the evening, and at bedtime. Use to check BGs E11.9   Continuous Glucose Sensor (FREESTYLE LIBRE 3 SENSOR) MISC Place 1 sensor on the skin every 14 days. Use to check glucose continuously E11.9   gabapentin  (NEURONTIN ) 300 MG capsule TAKE 1 CAPSULE BY MOUTH IN THE  MORNING AND TAKE 3 CAPSULES BY  MOUTH IN THE EVENING (Patient taking differently: Take 300-600 mg by  mouth See admin instructions. TAKE 1 CAPSULE BY MOUTH IN THE MORNING AND TAKE 2 CAPSULES BY MOUTH IN THE EVENING)   glucose blood (ACCU-CHEK GUIDE) test strip Test blood sugar 4 times per day, before meals and at bedtime.   insulin  NPH-regular Human (70-30) 100 UNIT/ML injection Inject 15-20 Units into the skin See admin instructions. Inject 20 units before breakfast and 15 units before supper   Insulin  Pen Needle (PEN NEEDLES) 31G X 8 MM MISC 1 each by Does not apply route in the morning and at bedtime.   Multiple Vitamin (MULTIVITAMIN WITH MINERALS) TABS tablet Take 1 tablet by mouth daily.   NIFEdipine (ADALAT CC) 60 MG 24 hr tablet Take 60 mg by mouth 2 (two) times daily.   oxyCODONE -acetaminophen  (PERCOCET) 5-325 MG tablet Take 1 tablet by mouth every 6 (six) hours as needed for severe pain (pain score 7-10).   rizatriptan  (MAXALT -MLT) 10 MG disintegrating tablet TAKE 1 TABLET BY MOUTH AS NEEDED FOR MIGRAINE. MAY REPEAT IN 2 HOURS IF NEEDED   sodium bicarbonate 650 MG tablet Take 650 mg by mouth 3 (three) times daily.   sodium zirconium cyclosilicate (LOKELMA) 10 g PACK packet Take 10 g by mouth daily.   [DISCONTINUED] Insulin  Syringe-Needle U-100 (BD INSULIN  SYRINGE U/F) 31G X 5/16 0.3 ML MISC USE WITH INSULIN  TWICE  DAILY DX E11.22   [DISCONTINUED] levothyroxine  (SYNTHROID ) 50 MCG tablet Take 1 tablet (50 mcg total) by mouth daily before breakfast.   Insulin  Syringe-Needle U-100 (BD INSULIN  SYRINGE U/F) 31G X 5/16 0.3 ML MISC USE WITH INSULIN  TWICE  DAILY DX E11.22   levothyroxine  (SYNTHROID ) 50 MCG tablet Take 1 tablet (50 mcg total) by mouth daily before breakfast.   No facility-administered encounter medications on file as of 02/20/2023.    ALLERGIES: Allergies  Allergen Reactions   Other Hives    Pt states that he is allergic to an ABT but he can not remember what it is   Sulfa  Antibiotics Hives    VACCINATION STATUS: Immunization History  Administered Date(s) Administered    Fluad Quad(high Dose 65+) 11/02/2019, 12/27/2020   Fluad Trivalent(High Dose 65+) 11/19/2022   Influenza Inj Mdck Quad With Preservative 11/26/2015   Influenza,inj,Quad PF,6+ Mos 12/25/2016   Pneumococcal Conjugate-13 12/27/2018   Pneumococcal Polysaccharide-23 04/16/2017   Tdap 08/19/2017   Zoster Recombinant(Shingrix) 07/28/2019, 09/07/2020    Diabetes He presents for his follow-up diabetic visit. He has type 2 diabetes mellitus. Onset time: He was diagnosed at approximate age of 48 years. His disease course has been fluctuating. There are no hypoglycemic associated symptoms. Pertinent negatives for hypoglycemia include no headaches, nervousness/anxiousness, pallor or tremors. Pertinent negatives for diabetes include no chest pain, no fatigue, no polydipsia, no polyphagia,  no polyuria and no weight loss. There are no hypoglycemic complications. Symptoms are stable. Diabetic complications include nephropathy, peripheral neuropathy and retinopathy. Risk factors for coronary artery disease include dyslipidemia, diabetes mellitus, hypertension, male sex, sedentary lifestyle and family history. Current diabetic treatment includes insulin  injections. He is compliant with treatment most of the time. His weight is fluctuating minimally. He is following a generally healthy diet. When asked about meal planning, he reported none. He has had a previous visit with a dietitian. He never participates in exercise. His overall blood glucose range is 140-180 mg/dl. (He presents today with his CGM and logs showing fluctuating glycemic profile overall.  His POCT A1c today is 6.8%, improving from last visit of 7.6%.  He denies any significant hypoglycemia.  Analysis of his CGM shows TIR 66%, TAR 34%, TBR 0% with a GMI of 7.4%.  ) An ACE inhibitor/angiotensin II receptor blocker is being taken. He does not see a podiatrist.Eye exam is current.  Hyperlipidemia This is a chronic problem. The current episode started more than  1 year ago. The problem is controlled. Recent lipid tests were reviewed and are normal. Exacerbating diseases include chronic renal disease, diabetes and hypothyroidism. There are no known factors aggravating his hyperlipidemia. Pertinent negatives include no chest pain, myalgias or shortness of breath. Current antihyperlipidemic treatment includes statins. The current treatment provides significant improvement of lipids. There are no compliance problems.  Risk factors for coronary artery disease include diabetes mellitus, dyslipidemia, hypertension, male sex and a sedentary lifestyle.  Hypertension This is a chronic problem. The current episode started more than 1 year ago. The problem has been gradually improving since onset. The problem is controlled. Pertinent negatives include no chest pain, headaches, palpitations or shortness of breath. There are no associated agents to hypertension. Risk factors for coronary artery disease include dyslipidemia, diabetes mellitus, family history, male gender and sedentary lifestyle. Past treatments include calcium  channel blockers and angiotensin blockers. The current treatment provides mild improvement. There are no compliance problems.  Hypertensive end-organ damage includes kidney disease and retinopathy. Identifiable causes of hypertension include chronic renal disease and a thyroid  problem.  Thyroid  Problem Presents for follow-up (Abnormality noted with recent thyroid  studies.  No prior work-up done, no previous labs to compare to.) visit. Patient reports no anxiety, cold intolerance, constipation, diarrhea, fatigue, heat intolerance, palpitations, tremors, weight gain or weight loss. The symptoms have been stable. His past medical history is significant for diabetes.    Review of systems  Constitutional: + Minimally fluctuating body weight,  current Body mass index is 30.16 kg/m. , no fatigue, no subjective hyperthermia, no subjective hypothermia Eyes: no  blurry vision, no xerophthalmia ENT: no sore throat, no nodules palpated in throat, no dysphagia/odynophagia, no hoarseness Cardiovascular: no chest pain, no shortness of breath, no palpitations, no leg swelling Respiratory: no cough, no shortness of breath Gastrointestinal: no nausea/vomiting/diarrhea Musculoskeletal: no muscle/joint aches Skin: no rashes, no hyperemia Neurological: no tremors, no numbness, no tingling, no dizziness Psychiatric: no depression, no anxiety   Objective:    BP (!) 146/74 (BP Location: Right Arm, Patient Position: Sitting, Cuff Size: Large)   Pulse 67   Ht 5' 9 (1.753 m)   Wt 204 lb 3.2 oz (92.6 kg)   BMI 30.16 kg/m   Wt Readings from Last 3 Encounters:  02/20/23 204 lb 3.2 oz (92.6 kg)  01/22/23 201 lb (91.2 kg)  12/09/22 209 lb (94.8 kg)     BP Readings from Last 3 Encounters:  02/20/23 ROLLEN)  146/74  01/22/23 (!) 152/91  12/09/22 116/69     Physical Exam- Limited  Constitutional:  Body mass index is 30.16 kg/m. , not in acute distress, normal state of mind Eyes:  EOMI, no exophthalmos Musculoskeletal: no gross deformities, strength intact in all four extremities, no gross restriction of joint movements Skin:  no rashes, no hyperemia Neurological: no tremor with outstretched hands  Diabetic Foot Exam - Simple   No data filed     CMP ( most recent) CMP     Component Value Date/Time   NA 143 12/09/2022 0640   NA 140 09/09/2022 1139   K 3.9 12/09/2022 0640   CL 110 12/09/2022 0640   CO2 18 (L) 09/09/2022 1139   GLUCOSE 134 (H) 12/09/2022 0640   BUN 67 (H) 12/09/2022 0640   BUN 72 (H) 09/09/2022 1139   CREATININE 4.30 (H) 12/09/2022 0640   CALCIUM  9.2 09/09/2022 1139   PROT 6.1 08/15/2021 1018   ALBUMIN 4.1 08/15/2021 1018   AST 13 08/15/2021 1018   ALT 13 08/15/2021 1018   ALKPHOS 82 08/15/2021 1018   BILITOT 0.3 08/15/2021 1018   GFRNONAA 18 04/23/2022 1536   GFRAA 47 (L) 09/13/2019 1420    Diabetic Labs (most  recent): Lab Results  Component Value Date   HGBA1C 6.8 (A) 02/20/2023   HGBA1C 7.6 (A) 11/20/2022   HGBA1C 6.9 (A) 08/20/2022   MICROALBUR 150 08/20/2020   MICROALBUR 237.3 09/13/2019     Lipid Panel ( most recent) Lipid Panel     Component Value Date/Time   CHOL 177 06/09/2022 0948   TRIG 206 (H) 06/09/2022 0948   HDL 30 (L) 06/09/2022 0948   CHOLHDL 5.9 (H) 06/09/2022 0948   LDLCALC 111 (H) 06/09/2022 0948   LABVLDL 36 06/09/2022 0948     Assessment & Plan:   1) Type 2 diabetes mellitus with stage 3b chronic kidney disease, with long-term current use of insulin  (HCC)  - Tashawn Laswell has currently uncontrolled symptomatic type 2 DM since  68 years of age.   Recent labs reviewed.  He presents today with his CGM and logs showing fluctuating glycemic profile overall.  His POCT A1c today is 6.8%, improving from last visit of 7.6%.  He denies any significant hypoglycemia.  Analysis of his CGM shows TIR 66%, TAR 34%, TBR 0% with a GMI of 7.4%.    - I had a long discussion with him about the progressive nature of diabetes and the pathology behind its complications. -his diabetes is complicated by CKD, peripheral neuropathy and he remains at a high risk for more acute and chronic complications which include CAD, CVA, CKD, retinopathy, and neuropathy. These are all discussed in detail with him.  - Nutritional counseling repeated at each appointment due to patients tendency to fall back in to old habits.  - The patient admits there is a room for improvement in their diet and drink choices. -  Suggestion is made for the patient to avoid simple carbohydrates from their diet including Cakes, Sweet Desserts / Pastries, Ice Cream, Soda (diet and regular), Sweet Tea, Candies, Chips, Cookies, Sweet Pastries, Store Bought Juices, Alcohol in Excess of 1-2 drinks a day, Artificial Sweeteners, Coffee Creamer, and Sugar-free Products. This will help patient to have stable blood glucose profile and  potentially avoid unintended weight gain.   - I encouraged the patient to switch to unprocessed or minimally processed complex starch and increased protein intake (animal or plant source), fruits, and vegetables.   -  Patient is advised to stick to a routine mealtimes to eat 3 meals a day and avoid unnecessary snacks (to snack only to correct hypoglycemia).  - I have approached him with the following individualized plan to manage  his diabetes and patient agrees:   - he will continue to benefit from simplicity of premixed insulin .   -He is advised to adjust his Novolin 70/30 to 20 units with breakfast and 15 units with supper if glucose is above 90 and he is eating.    -He is encouraged to continue using his CGM to monitor blood glucose 4 times per day, before meals and before bed, and call the clinic if he has readings less than 70 or greater than 300 for 3 tests in a row.  - Specific targets for  A1c;  LDL, HDL,  and Triglycerides were discussed with the patient.  2) Blood Pressure /Hypertension:  His blood pressure is controlled to target.  He is advised to continue Chlorthalidone 25 mg po daily as well as Amlodipine  10 mg po daily as needed.  3) Lipids/Hyperlipidemia:  His most recent lipid panel from 06/09/22 shows uncontrolled LDL of 111.  He is advised to continue Atorvastatin  40 mg po daily at bedtime.  Side effects and precautions discussed with him.    4)  Weight/Diet:  His Body mass index is 30.16 kg/m.     he is a candidate for some weight loss. I discussed with him the fact that loss of 5 - 10% of his  current body weight will have the most impact on his diabetes management.  Exercise, and detailed carbohydrates information provided  -  detailed on discharge instructions.  5)Hypothyroidism-acquired: His previsit TFTs are consistent with appropriate hormone replacement.  He is advised to continue his Levothyroxine  50 mcg po daily before breakfast.     - The correct intake of  thyroid  hormone (Levothyroxine , Synthroid ), is on empty stomach first thing in the morning, with water, separated by at least 30 minutes from breakfast and other medications,  and separated by more than 4 hours from calcium , iron, multivitamins, acid reflux medications (PPIs).  - This medication is a life-long medication and will be needed to correct thyroid  hormone imbalances for the rest of your life.  The dose may change from time to time, based on thyroid  blood work.  - It is extremely important to be consistent taking this medication, near the same time each morning.  -AVOID TAKING PRODUCTS CONTAINING BIOTIN (commonly found in Hair, Skin, Nails vitamins) AS IT INTERFERES WITH THE VALIDITY OF THYROID  FUNCTION BLOOD TESTS.  6) Chronic Care/Health Maintenance: -he is not on ARB (per nephrology) and is on Statin medications and is encouraged to initiate and continue to follow up with Ophthalmology, Dentist,  Podiatrist at least yearly or according to recommendations, and advised to stay away from smoking. I have recommended yearly flu vaccine and pneumonia vaccine at least every 5 years; moderate intensity exercise for up to 150 minutes weekly; and  sleep for at least 7 hours a day.  - he is advised to maintain close follow up with [name], DO for primary care needs, as well as his other providers for optimal and coordinated care  He is being worked up for HD in the future by nephrology.     I spent  22  minutes in the care of the patient today including review of labs from CMP, Lipids, Thyroid  Function, Hematology (current and previous including abstractions from other facilities);  face-to-face time discussing  his blood glucose readings/logs, discussing hypoglycemia and hyperglycemia episodes and symptoms, medications doses, his options of short and long term treatment based on the latest standards of care / guidelines;  discussion about incorporating lifestyle medicine;  and  documenting the encounter. Risk reduction counseling performed per USPSTF guidelines to reduce obesity and cardiovascular risk factors.     Please refer to Patient Instructions for Blood Glucose Monitoring and Insulin /Medications Dosing Guide  in media tab for additional information. Please  also refer to  Patient Self Inventory in the Media  tab for reviewed elements of pertinent patient history.  [name] participated in the discussions, expressed understanding, and voiced agreement with the above plans.  All questions were answered to his satisfaction. he is encouraged to contact clinic should he have any questions or concerns prior to his return visit.   Follow up plan: - Return in about 3 months (around 05/21/2023) for Diabetes F/U with A1c in office, No previsit labs, Bring meter and logs.  Benton Rio, [facility] [address] [place], [place] [address] Phone: [phone] Fax: [phone]   02/20/2023, 11:45 AM

## 2023-02-25 ENCOUNTER — Ambulatory Visit: Payer: Medicare Other | Admitting: Family Medicine

## 2023-02-25 ENCOUNTER — Other Ambulatory Visit (HOSPITAL_COMMUNITY): Payer: Self-pay

## 2023-02-25 ENCOUNTER — Encounter: Payer: Self-pay | Admitting: Family Medicine

## 2023-02-25 VITALS — BP 170/76 | HR 70 | Temp 98.3°F | Ht 69.0 in | Wt 201.0 lb

## 2023-02-25 DIAGNOSIS — N185 Chronic kidney disease, stage 5: Secondary | ICD-10-CM

## 2023-02-25 DIAGNOSIS — E1159 Type 2 diabetes mellitus with other circulatory complications: Secondary | ICD-10-CM

## 2023-02-25 DIAGNOSIS — E1142 Type 2 diabetes mellitus with diabetic polyneuropathy: Secondary | ICD-10-CM | POA: Diagnosis not present

## 2023-02-25 DIAGNOSIS — H9191 Unspecified hearing loss, right ear: Secondary | ICD-10-CM

## 2023-02-25 DIAGNOSIS — E1122 Type 2 diabetes mellitus with diabetic chronic kidney disease: Secondary | ICD-10-CM

## 2023-02-25 DIAGNOSIS — I152 Hypertension secondary to endocrine disorders: Secondary | ICD-10-CM | POA: Diagnosis not present

## 2023-02-25 DIAGNOSIS — E1169 Type 2 diabetes mellitus with other specified complication: Secondary | ICD-10-CM

## 2023-02-25 DIAGNOSIS — E785 Hyperlipidemia, unspecified: Secondary | ICD-10-CM

## 2023-02-25 DIAGNOSIS — Z794 Long term (current) use of insulin: Secondary | ICD-10-CM

## 2023-02-25 MED ORDER — GABAPENTIN 300 MG PO CAPS: 300.0000 mg | ORAL_CAPSULE | Freq: Two times a day (BID) | ORAL | 1 refills | Status: DC | PRN

## 2023-02-25 NOTE — Progress Notes (Signed)
 Subjective: CC:DM PCP: Anthony Guerin, DO HQI:ONGEX Allie is a 68 y.o. male presenting to clinic today for:  1. Type 2 Diabetes with hypertension, hyperlipidemia and CKD5 not on HD:  He is closely followed by endocrinology, nephrology.  Brings today his recent lab work from nephrology.  Last sugar was under excellent control.  He continues to use CGM as directed for glucose monitoring.  He had AV fistula surgery since her last visit  Diabetes Health Maintenance Due  Topic Date Due   FOOT EXAM  04/29/2023   HEMOGLOBIN A1C  08/20/2023   OPHTHALMOLOGY EXAM  10/30/2023    Last A1c:  Lab Results  Component Value Date   HGBA1C 6.8 (A) 02/20/2023    ROS: No chest pain, shortness of breath or edema reported.  2.  Hearing loss He would like to go ahead and proceed with referral to ENT given right sided hearing loss.  He often gets in arguments with his wife because they cannot hear each other.  Want to get this corrected  ROS: Per HPI  Allergies  Allergen Reactions   Other Hives    Pt states that he is allergic to an ABT but he can not remember what it is   Sulfa  Antibiotics Hives   Past Medical History:  Diagnosis Date   Diabetes (HCC)    Diabetic neuropathy (HCC)    Essential hypertension, benign 06/01/2019   Hyperlipidemia     Current Outpatient Medications:    blood glucose meter kit and supplies, Dispense based on patient and insurance preference. Use up to four times daily as directed. (FOR ICD-10 E10.9, E11.9). Check blood sugar twice a day as directed., Disp: 1 each, Rfl: 0   calcitRIOL (ROCALTROL) 0.25 MCG capsule, Take 0.25 mcg by mouth every Monday, Wednesday, and Friday., Disp: , Rfl:    chlorthalidone (HYGROTON) 25 MG tablet, Take 12.5 mg by mouth daily., Disp: , Rfl:    Continuous Glucose Receiver (FREESTYLE LIBRE 3 READER) DEVI, 1 each by Does not apply route in the morning, at noon, in the evening, and at bedtime. Use to check BGs E11.9, Disp: 1 each,  Rfl: 0   Continuous Glucose Sensor (FREESTYLE LIBRE 3 SENSOR) MISC, Place 1 sensor on the skin every 14 days. Use to check glucose continuously E11.9, Disp: 6 each, Rfl: 0   gabapentin  (NEURONTIN ) 300 MG capsule, TAKE 1 CAPSULE BY MOUTH IN THE  MORNING AND TAKE 3 CAPSULES BY  MOUTH IN THE EVENING (Patient taking differently: Take 300-600 mg by mouth See admin instructions. TAKE 1 CAPSULE BY MOUTH IN THE MORNING AND TAKE 2 CAPSULES BY MOUTH IN THE EVENING), Disp: 360 capsule, Rfl: 0   glucose blood (ACCU-CHEK GUIDE) test strip, Test blood sugar 4 times per day, before meals and at bedtime., Disp: 500 strip, Rfl: 3   insulin  NPH-regular Human (70-30) 100 UNIT/ML injection, Inject 15-20 Units into the skin See admin instructions. Inject 20 units before breakfast and 15 units before supper, Disp: , Rfl:    Insulin  Pen Needle (PEN NEEDLES) 31G X 8 MM MISC, 1 each by Does not apply route in the morning and at bedtime., Disp: 100 each, Rfl: 11   Insulin  Syringe-Needle U-100 (BD INSULIN  SYRINGE U/F) 31G X 5/16" 0.3 ML MISC, USE WITH INSULIN  TWICE  DAILY DX E11.22, Disp: 200 each, Rfl: 3   levothyroxine  (SYNTHROID ) 50 MCG tablet, Take 1 tablet (50 mcg total) by mouth daily before breakfast., Disp: 100 tablet, Rfl: 3   Multiple  Vitamin (MULTIVITAMIN WITH MINERALS) TABS tablet, Take 1 tablet by mouth daily., Disp: , Rfl:    NIFEdipine (ADALAT CC) 60 MG 24 hr tablet, Take 60 mg by mouth 2 (two) times daily., Disp: , Rfl:    oxyCODONE -acetaminophen  (PERCOCET) 5-325 MG tablet, Take 1 tablet by mouth every 6 (six) hours as needed for severe pain (pain score 7-10)., Disp: 10 tablet, Rfl: 0   rizatriptan  (MAXALT -MLT) 10 MG disintegrating tablet, TAKE 1 TABLET BY MOUTH AS NEEDED FOR MIGRAINE. MAY REPEAT IN 2 HOURS IF NEEDED, Disp: 10 tablet, Rfl: 2   sodium bicarbonate 650 MG tablet, Take 650 mg by mouth 3 (three) times daily., Disp: , Rfl:    sodium zirconium cyclosilicate (LOKELMA) 10 g PACK packet, Take 10 g by mouth  daily., Disp: , Rfl:  Social History   Socioeconomic History   Marital status: Single    Spouse name: Not on file   Number of children: 3   Years of education: Not on file   Highest education level: Not on file  Occupational History   Occupation: retired  Tobacco Use   Smoking status: Never   Smokeless tobacco: Never  Vaping Use   Vaping status: Never Used  Substance and Sexual Activity   Alcohol use: Not Currently   Drug use: Never   Sexual activity: Not Currently  Other Topics Concern   Not on file  Social History Narrative   Not on file   Social Drivers of Health   Financial Resource Strain: Low Risk  (04/07/2022)   Overall Financial Resource Strain (CARDIA)    Difficulty of Paying Living Expenses: Not hard at all  Food Insecurity: No Food Insecurity (04/07/2022)   Hunger Vital Sign    Worried About Running Out of Food in the Last Year: Never true    Ran Out of Food in the Last Year: Never true  Transportation Needs: No Transportation Needs (04/07/2022)   PRAPARE - Administrator, Civil Service (Medical): No    Lack of Transportation (Non-Medical): No  Physical Activity: Insufficiently Active (04/07/2022)   Exercise Vital Sign    Days of Exercise per Week: 3 days    Minutes of Exercise per Session: 30 min  Stress: No Stress Concern Present (04/07/2022)   [employer] of Occupational Health - Occupational Stress Questionnaire    Feeling of Stress : Not at all  Social Connections: Moderately Isolated (04/07/2022)   Social Connection and Isolation Panel [NHANES]    Frequency of Communication with Friends and Family: More than three times a week    Frequency of Social Gatherings with Friends and Family: More than three times a week    Attends Religious Services: More than 4 times per year    Active Member of [employer] or Organizations: No    Attends Banker Meetings: Never    Marital Status: Widowed  Intimate Partner Violence: Not At Risk  (04/07/2022)   Humiliation, Afraid, Rape, and Kick questionnaire    Fear of Current or Ex-Partner: No    Emotionally Abused: No    Physically Abused: No    Sexually Abused: No   Family History  Problem Relation Age of Onset   Stroke Mother    Diabetes Mother    Hypertension Mother    Hyperlipidemia Mother    Kidney disease Mother    COPD Father    Diabetes Sister    Stroke Brother    Diabetes Brother    Arthritis Maternal Grandmother  Objective: Office vital signs reviewed. BP (!) 170/76 Comment: pt has not ate or took bp meds for the day KH/CMA  Pulse 70   Temp 98.3 F (36.8 C)   Ht 5\' 9"  (1.753 m)   Wt 201 lb (91.2 kg)   SpO2 98%   BMI 29.68 kg/m   Physical Examination:  General: Awake, alert, well nourished, No acute distress HEENT: sclera white, MMM. TMs unobscured Cardio: regular rate and rhythm, S1S2 heard, no murmurs appreciated Pulm: clear to auscultation bilaterally, no wheezes, rhonchi or rales; normal work of breathing on room air  Assessment/ Plan: 68 y.o. male   Hypertension associated with diabetes (HCC)  Hyperlipidemia associated with type 2 diabetes mellitus (HCC)  Type 2 diabetes mellitus with stage 5 chronic kidney disease not on chronic dialysis, with long-term current use of insulin  (HCC)  Diabetic polyneuropathy associated with type 2 diabetes mellitus (HCC) - Plan: gabapentin  (NEURONTIN ) 300 MG capsule  Unilateral hearing loss, right - Plan: Ambulatory referral to ENT  Blood pressure not at goal but has not taken his medications today.  Resume medication, come back for blood pressure recheck  Needs statin  Continue to follow-up with endocrinology for diabetes.  Continue CGM for continuous glucose monitoring.  Continue to follow-up with nephrology.  AV fistula had palpable thrill and appeared to be working  Continue gabapentin  as needed as directed.  However need to reduce the dose to reflect current renal function  Referral to  audiology/ENT placed given unilateral hearing loss  Anthony Guerin, DO [facility] [phone]

## 2023-03-27 ENCOUNTER — Ambulatory Visit: Payer: Medicare Other | Admitting: Podiatry

## 2023-03-27 ENCOUNTER — Encounter: Payer: Self-pay | Admitting: Podiatry

## 2023-03-27 DIAGNOSIS — B351 Tinea unguium: Secondary | ICD-10-CM

## 2023-03-27 DIAGNOSIS — E1142 Type 2 diabetes mellitus with diabetic polyneuropathy: Secondary | ICD-10-CM | POA: Diagnosis not present

## 2023-03-27 DIAGNOSIS — M79609 Pain in unspecified limb: Secondary | ICD-10-CM | POA: Diagnosis not present

## 2023-03-27 NOTE — Progress Notes (Signed)
Subjective: Anthony French is a 68 y.o. male patient  with concern of thickened elongated and painful nails that are difficult to trim. Requesting to have them trimmed today. Relates burning and tingling in their feet. Patient is diabetic and last A1c was  Lab Results  Component Value Date   HGBA1C 6.8 (A) 02/20/2023   .   PCP:  [name], DO     Referring Provider: [name], DO   Patient Active Problem List   Diagnosis Date Noted   Nodular basal cell carcinoma (BCC) 10/28/2021   Type 2 diabetes mellitus with stage 3b chronic kidney disease, with long-term current use of insulin (HCC) 06/01/2019   Thrombocytopenia (HCC) 05/11/2019   Diabetic retinopathy (HCC) 11/06/2017   Actinic keratosis 04/16/2017   Diabetic polyneuropathy associated with type 2 diabetes mellitus (HCC) 03/13/2017   Foot callus 03/13/2017   Trigger middle finger of left hand 03/13/2017   Diabetes mellitus (HCC) 03/27/2015   Hyperlipidemia associated with type 2 diabetes mellitus (HCC) 03/25/2011   Current Outpatient Medications on File Prior to Visit  Medication Sig Dispense Refill   blood glucose meter kit and supplies Dispense based on patient and insurance preference. Use up to four times daily as directed. (FOR ICD-10 E10.9, E11.9). Check blood sugar twice a day as directed. 1 each 0   calcitRIOL (ROCALTROL) 0.25 MCG capsule Take 0.25 mcg by mouth every Monday, Wednesday, and Friday.     chlorthalidone (HYGROTON) 25 MG tablet Take 12.5 mg by mouth daily.     Continuous Glucose Receiver (FREESTYLE LIBRE 3 READER) DEVI 1 each by Does not apply route in the morning, at noon, in the evening, and at bedtime. Use to check BGs E11.9 1 each 0   Continuous Glucose Sensor (FREESTYLE LIBRE 3 SENSOR) MISC Place 1 sensor on the skin every 14 days. Use to check glucose continuously E11.9 6 each 0   gabapentin (NEURONTIN) 300 MG capsule Take 1 capsule (300 mg total) by mouth 2 (two) times daily as needed  (neuropathy (Dose change due to advanced kidney disease)). 180 capsule 1   glucose blood (ACCU-CHEK GUIDE) test strip Test blood sugar 4 times per day, before meals and at bedtime. 500 strip 3   insulin NPH-regular Human (70-30) 100 UNIT/ML injection Inject 15-20 Units into the skin See admin instructions. Inject 20 units before breakfast and 15 units before supper     Insulin Pen Needle (PEN NEEDLES) 31G X 8 MM MISC 1 each by Does not apply route in the morning and at bedtime. 100 each 11   Insulin Syringe-Needle U-100 (BD INSULIN SYRINGE U/F) 31G X 5/16" 0.3 ML MISC USE WITH INSULIN TWICE  DAILY DX E11.22 200 each 3   levothyroxine (SYNTHROID) 50 MCG tablet Take 1 tablet (50 mcg total) by mouth daily before breakfast. 100 tablet 3   Multiple Vitamin (MULTIVITAMIN WITH MINERALS) TABS tablet Take 1 tablet by mouth daily.     NIFEdipine (ADALAT CC) 60 MG 24 hr tablet Take 60 mg by mouth 2 (two) times daily.     oxyCODONE-acetaminophen (PERCOCET) 5-325 MG tablet Take 1 tablet by mouth every 6 (six) hours as needed for severe pain (pain score 7-10). 10 tablet 0   rizatriptan (MAXALT-MLT) 10 MG disintegrating tablet TAKE 1 TABLET BY MOUTH AS NEEDED FOR MIGRAINE. MAY REPEAT IN 2 HOURS IF NEEDED 10 tablet 2   sodium bicarbonate 650 MG tablet Take 650 mg by mouth 3 (three) times daily.     sodium  zirconium cyclosilicate (LOKELMA) 10 g PACK packet Take 10 g by mouth daily.     No current facility-administered medications on file prior to visit.   Allergies  Allergen Reactions   Other Hives    Pt states that he is allergic to an ABT but he can not remember what it is   Sulfa Antibiotics Hives      Objective: General: Patient is awake, alert, and oriented x 3 and in no acute distress.  Integument: Skin is warm, dry and supple bilateral. Nails are tender, long, thickened and  dystrophic with subungual debris, consistent with onychomycosis 1 left. Remainder of nails are elongated. No signs of  infection. No open lesions or preulcerative lesions present bilateral. Remaining integument unremarkable.  Vasculature:  Dorsalis Pedis pulse 2/4 bilateral. Posterior Tibial pulse  1/4 bilateral.  Capillary fill time <3 sec 1-5 bilateral. Temperature gradient within normal limits. no varicosities present bilateral. no edema present bilateral.   Neurology: The patient has intact sensation measured with a 5.07/10g Semmes Weinstein Monofilament at  5/5 pedal sites bilateral . Vibratory sensation diminished bilateral with tuning fork. No Babinski sign present bilateral.   Musculoskeletal: No symptomatic pedal deformities noted bilateral. Muscular strength 5/5 in all lower extremity muscular groups bilateral without pain on range of motion . No tenderness with calf compression bilateral.  Assessment and Plan:   ICD-10-CM   1. Pain due to onychomycosis of nail  B35.1    M79.609     2. Diabetic polyneuropathy associated with type 2 diabetes mellitus (HCC)  E11.42         -Examined patient. -Mechanically debrided all nails 1-5 bilateral using sterile nail nipper and filed with dremel without incident  -Answered all patient questions -Patient to return  in 3 months for at risk foot care -Patient advised to call the office if any problems or questions arise in the meantime.  Louann Sjogren, DPM

## 2023-03-31 DIAGNOSIS — Z961 Presence of intraocular lens: Secondary | ICD-10-CM | POA: Diagnosis not present

## 2023-03-31 DIAGNOSIS — H2512 Age-related nuclear cataract, left eye: Secondary | ICD-10-CM | POA: Diagnosis not present

## 2023-03-31 LAB — HM DIABETES EYE EXAM

## 2023-04-03 DIAGNOSIS — E113313 Type 2 diabetes mellitus with moderate nonproliferative diabetic retinopathy with macular edema, bilateral: Secondary | ICD-10-CM | POA: Diagnosis not present

## 2023-04-08 ENCOUNTER — Other Ambulatory Visit: Payer: Medicare Other

## 2023-04-08 DIAGNOSIS — D631 Anemia in chronic kidney disease: Secondary | ICD-10-CM | POA: Diagnosis not present

## 2023-04-08 DIAGNOSIS — D649 Anemia, unspecified: Secondary | ICD-10-CM | POA: Diagnosis not present

## 2023-04-08 DIAGNOSIS — R809 Proteinuria, unspecified: Secondary | ICD-10-CM | POA: Diagnosis not present

## 2023-04-08 DIAGNOSIS — N189 Chronic kidney disease, unspecified: Secondary | ICD-10-CM | POA: Diagnosis not present

## 2023-04-08 DIAGNOSIS — E211 Secondary hyperparathyroidism, not elsewhere classified: Secondary | ICD-10-CM | POA: Diagnosis not present

## 2023-04-27 DIAGNOSIS — E8722 Chronic metabolic acidosis: Secondary | ICD-10-CM | POA: Diagnosis not present

## 2023-04-27 DIAGNOSIS — N184 Chronic kidney disease, stage 4 (severe): Secondary | ICD-10-CM | POA: Diagnosis not present

## 2023-04-27 DIAGNOSIS — E875 Hyperkalemia: Secondary | ICD-10-CM | POA: Diagnosis not present

## 2023-04-27 DIAGNOSIS — D631 Anemia in chronic kidney disease: Secondary | ICD-10-CM | POA: Diagnosis not present

## 2023-04-29 ENCOUNTER — Other Ambulatory Visit: Payer: Self-pay | Admitting: Nurse Practitioner

## 2023-04-29 ENCOUNTER — Other Ambulatory Visit: Payer: Self-pay | Admitting: Family Medicine

## 2023-04-29 DIAGNOSIS — N184 Chronic kidney disease, stage 4 (severe): Secondary | ICD-10-CM

## 2023-05-20 ENCOUNTER — Other Ambulatory Visit

## 2023-05-20 DIAGNOSIS — N189 Chronic kidney disease, unspecified: Secondary | ICD-10-CM | POA: Diagnosis not present

## 2023-05-21 ENCOUNTER — Encounter: Payer: Self-pay | Admitting: Nurse Practitioner

## 2023-05-21 ENCOUNTER — Ambulatory Visit (INDEPENDENT_AMBULATORY_CARE_PROVIDER_SITE_OTHER): Payer: Medicare Other | Admitting: Nurse Practitioner

## 2023-05-21 VITALS — BP 138/80 | HR 66 | Ht 69.0 in | Wt 201.4 lb

## 2023-05-21 DIAGNOSIS — I10 Essential (primary) hypertension: Secondary | ICD-10-CM

## 2023-05-21 DIAGNOSIS — E782 Mixed hyperlipidemia: Secondary | ICD-10-CM

## 2023-05-21 DIAGNOSIS — Z794 Long term (current) use of insulin: Secondary | ICD-10-CM

## 2023-05-21 DIAGNOSIS — E039 Hypothyroidism, unspecified: Secondary | ICD-10-CM

## 2023-05-21 DIAGNOSIS — E559 Vitamin D deficiency, unspecified: Secondary | ICD-10-CM | POA: Diagnosis not present

## 2023-05-21 DIAGNOSIS — N184 Chronic kidney disease, stage 4 (severe): Secondary | ICD-10-CM | POA: Diagnosis not present

## 2023-05-21 DIAGNOSIS — E1122 Type 2 diabetes mellitus with diabetic chronic kidney disease: Secondary | ICD-10-CM

## 2023-05-21 NOTE — Progress Notes (Signed)
 05/21/2023, 11:52 AM  Endocrinology follow-up note   Subjective:    Patient ID: Anthony French, male    DOB: 03-08-1955.  Merek Niu is being seen in follow-up after he was seen in consultation for management of currently uncontrolled symptomatic diabetes requested by  [name], DO.   Past Medical History:  Diagnosis Date   Diabetes (HCC)    Diabetic neuropathy (HCC)    Essential hypertension, benign 06/01/2019   Hyperlipidemia     Past Surgical History:  Procedure Laterality Date   APPENDECTOMY  1980   AV FISTULA PLACEMENT Left 12/09/2022   Procedure: LEFT ARM BRACHIOCEPHALIC ARTERIOVENOUS (AV) FISTULA CREATION;  Surgeon: Victorino Sparrow, MD;  Location: [facility] OR;  Service: Vascular;  Laterality: Left;   CYST REMOVAL TRUNK      Social History   Socioeconomic History   Marital status: Single    Spouse name: Not on file   Number of children: 3   Years of education: Not on file   Highest education level: Not on file  Occupational History   Occupation: retired  Tobacco Use   Smoking status: Never   Smokeless tobacco: Never  Vaping Use   Vaping status: Never Used  Substance and Sexual Activity   Alcohol use: Not Currently   Drug use: Never   Sexual activity: Not Currently  Other Topics Concern   Not on file  Social History Narrative   Not on file   Social Drivers of Health   Financial Resource Strain: Low Risk  (04/07/2022)   Overall Financial Resource Strain (CARDIA)    Difficulty of Paying Living Expenses: Not hard at all  Food Insecurity: No Food Insecurity (04/07/2022)   Hunger Vital Sign    Worried About Running Out of Food in the Last Year: Never true    Ran Out of Food in the Last Year: Never true  Transportation Needs: No Transportation Needs (04/07/2022)   PRAPARE - Administrator, Civil Service (Medical): No    Lack of Transportation (Non-Medical): No  Physical Activity: Insufficiently Active (04/07/2022)    Exercise Vital Sign    Days of Exercise per Week: 3 days    Minutes of Exercise per Session: 30 min  Stress: No Stress Concern Present (04/07/2022)   [employer] of Occupational Health - Occupational Stress Questionnaire    Feeling of Stress : Not at all  Social Connections: Moderately Isolated (04/07/2022)   Social Connection and Isolation Panel [NHANES]    Frequency of Communication with Friends and Family: More than three times a week    Frequency of Social Gatherings with Friends and Family: More than three times a week    Attends Religious Services: More than 4 times per year    Active Member of [employer] or Organizations: No    Attends Banker Meetings: Never    Marital Status: Widowed    Family History  Problem Relation Age of Onset   Stroke Mother    Diabetes Mother    Hypertension Mother    Hyperlipidemia Mother    Kidney disease Mother    COPD Father    Diabetes Sister    Stroke Brother    Diabetes Brother    Arthritis Maternal Grandmother     Outpatient Encounter Medications as of 05/21/2023  Medication Sig   blood glucose meter kit and supplies Dispense based on patient and insurance preference. Use up to four times daily as directed. (FOR  ICD-10 E10.9, E11.9). Check blood sugar twice a day as directed.   calcitRIOL (ROCALTROL) 0.25 MCG capsule Take 0.25 mcg by mouth every Monday, Wednesday, and Friday.   chlorthalidone (HYGROTON) 25 MG tablet Take 12.5 mg by mouth daily.   Continuous Glucose Receiver (FREESTYLE LIBRE 3 READER) DEVI 1 each by Does not apply route in the morning, at noon, in the evening, and at bedtime. Use to check BGs E11.9   Continuous Glucose Sensor (FREESTYLE LIBRE 3 SENSOR) MISC PLACE 1 SENSOR ON THE SKIN EVERY 14 DAYS. USE TO CHECK GLUCOSE CONTINUOUSLY E11.9   gabapentin (NEURONTIN) 300 MG capsule Take 1 capsule (300 mg total) by mouth 2 (two) times daily as needed (neuropathy (Dose change due to advanced kidney disease)).    glucose blood (ACCU-CHEK GUIDE) test strip Test blood sugar 4 times per day, before meals and at bedtime.   insulin NPH-regular Human (70-30) 100 UNIT/ML injection Inject 15-20 Units into the skin See admin instructions. Inject 20 units before breakfast and 15 units before supper   Insulin Pen Needle (PEN NEEDLES) 31G X 8 MM MISC 1 each by Does not apply route in the morning and at bedtime.   Insulin Syringe-Needle U-100 (BD INSULIN SYRINGE U/F) 31G X 5/16" 0.3 ML MISC USE WITH INSULIN TWICE  DAILY DX E11.22   levothyroxine (SYNTHROID) 50 MCG tablet TAKE 1 TABLET BY MOUTH DAILY BEFORE BREAKFAST   Multiple Vitamin (MULTIVITAMIN WITH MINERALS) TABS tablet Take 1 tablet by mouth daily.   NIFEdipine (ADALAT CC) 60 MG 24 hr tablet Take 60 mg by mouth 2 (two) times daily.   oxyCODONE-acetaminophen (PERCOCET) 5-325 MG tablet Take 1 tablet by mouth every 6 (six) hours as needed for severe pain (pain score 7-10).   rizatriptan (MAXALT-MLT) 10 MG disintegrating tablet TAKE 1 TABLET BY MOUTH AS NEEDED FOR MIGRAINE. MAY REPEAT IN 2 HOURS IF NEEDED   sodium bicarbonate 650 MG tablet Take 650 mg by mouth 3 (three) times daily.   sodium zirconium cyclosilicate (LOKELMA) 10 g PACK packet Take 10 g by mouth daily.   No facility-administered encounter medications on file as of 05/21/2023.    ALLERGIES: Allergies  Allergen Reactions   Other Hives    Pt states that he is allergic to an ABT but he can not remember what it is   Sulfa Antibiotics Hives    VACCINATION STATUS: Immunization History  Administered Date(s) Administered   Fluad Quad(high Dose 65+) 11/02/2019, 12/27/2020   Fluad Trivalent(High Dose 65+) 11/19/2022   Influenza Inj Mdck Quad With Preservative 11/26/2015   Influenza,inj,Quad PF,6+ Mos 12/25/2016   Pneumococcal Conjugate-13 12/27/2018   Pneumococcal Polysaccharide-23 04/16/2017   Tdap 08/19/2017   Zoster Recombinant(Shingrix) 07/28/2019, 09/07/2020    Diabetes He presents for his  follow-up diabetic visit. He has type 2 diabetes mellitus. Onset time: He was diagnosed at approximate age of 68 years. His disease course has been fluctuating. There are no hypoglycemic associated symptoms. Pertinent negatives for hypoglycemia include no headaches, nervousness/anxiousness, pallor or tremors. Pertinent negatives for diabetes include no chest pain, no fatigue, no polydipsia, no polyphagia, no polyuria and no weight loss. There are no hypoglycemic complications. Symptoms are stable. Diabetic complications include nephropathy, peripheral neuropathy and retinopathy. Risk factors for coronary artery disease include dyslipidemia, diabetes mellitus, hypertension, male sex, sedentary lifestyle and family history. Current diabetic treatment includes insulin injections. He is compliant with treatment most of the time. His weight is fluctuating minimally. He is following a generally healthy diet. When asked about meal  planning, he reported none. He has had a previous visit with a dietitian. He never participates in exercise. (He presents today with his CGM and logs showing improved, at goal glycemic profile overall.  His POCT A1c today is 6.2%, improving from last visit of 6.8%.  He denies any significant hypoglycemia.  Analysis of his CGM shows TIR 78%, TAR 21%, TBR 1% with a GMI of 6.9%.  ) An ACE inhibitor/angiotensin II receptor blocker is being taken. He does not see a podiatrist.Eye exam is current.  Hyperlipidemia This is a chronic problem. The current episode started more than 1 year ago. The problem is controlled. Recent lipid tests were reviewed and are normal. Exacerbating diseases include chronic renal disease, diabetes and hypothyroidism. There are no known factors aggravating his hyperlipidemia. Pertinent negatives include no chest pain, myalgias or shortness of breath. Current antihyperlipidemic treatment includes statins. The current treatment provides significant improvement of lipids. There  are no compliance problems.  Risk factors for coronary artery disease include diabetes mellitus, dyslipidemia, hypertension, male sex and a sedentary lifestyle.  Hypertension This is a chronic problem. The current episode started more than 1 year ago. The problem has been gradually improving since onset. The problem is controlled. Pertinent negatives include no chest pain, headaches, palpitations or shortness of breath. There are no associated agents to hypertension. Risk factors for coronary artery disease include dyslipidemia, diabetes mellitus, family history, male gender and sedentary lifestyle. Past treatments include calcium channel blockers and angiotensin blockers. The current treatment provides mild improvement. There are no compliance problems.  Hypertensive end-organ damage includes kidney disease and retinopathy. Identifiable causes of hypertension include chronic renal disease and a thyroid problem.  Thyroid Problem Presents for follow-up (Abnormality noted with recent thyroid studies.  No prior work-up done, no previous labs to compare to.) visit. Patient reports no anxiety, cold intolerance, constipation, diarrhea, fatigue, heat intolerance, palpitations, tremors, weight gain or weight loss. The symptoms have been stable. His past medical history is significant for diabetes.    Review of systems  Constitutional: + Minimally fluctuating body weight,  current Body mass index is 29.74 kg/m. , no fatigue, no subjective hyperthermia, no subjective hypothermia Eyes: no blurry vision, no xerophthalmia ENT: no sore throat, no nodules palpated in throat, no dysphagia/odynophagia, no hoarseness Cardiovascular: no chest pain, no shortness of breath, no palpitations, no leg swelling Respiratory: no cough, no shortness of breath Gastrointestinal: no nausea/vomiting/diarrhea Musculoskeletal: no muscle/joint aches Skin: no rashes, no hyperemia Neurological: no tremors, no numbness, no tingling, no  dizziness Psychiatric: no depression, no anxiety   Objective:    BP 138/80 (BP Location: Right Arm, Patient Position: Sitting, Cuff Size: Large)   Pulse 66   Ht 5\' 9"  (1.753 m)   Wt 201 lb 6.4 oz (91.4 kg)   BMI 29.74 kg/m   Wt Readings from Last 3 Encounters:  05/21/23 201 lb 6.4 oz (91.4 kg)  02/25/23 201 lb (91.2 kg)  02/20/23 204 lb 3.2 oz (92.6 kg)     BP Readings from Last 3 Encounters:  05/21/23 138/80  02/25/23 (!) 170/76  02/20/23 (!) 146/74     Physical Exam- Limited  Constitutional:  Body mass index is 29.74 kg/m. , not in acute distress, normal state of mind Eyes:  EOMI, no exophthalmos Musculoskeletal: no gross deformities, strength intact in all four extremities, no gross restriction of joint movements Skin:  no rashes, no hyperemia Neurological: no tremor with outstretched hands  Diabetic Foot Exam - Simple   No data  filed     CMP ( most recent) CMP     Component Value Date/Time   NA 143 12/09/2022 0640   NA 140 09/09/2022 1139   K 3.9 12/09/2022 0640   CL 110 12/09/2022 0640   CO2 18 (L) 09/09/2022 1139   GLUCOSE 134 (H) 12/09/2022 0640   BUN 67 (H) 12/09/2022 0640   BUN 72 (H) 09/09/2022 1139   CREATININE 4.30 (H) 12/09/2022 0640   CALCIUM 9.2 09/09/2022 1139   PROT 6.1 08/15/2021 1018   ALBUMIN 4.1 08/15/2021 1018   AST 13 08/15/2021 1018   ALT 13 08/15/2021 1018   ALKPHOS 82 08/15/2021 1018   BILITOT 0.3 08/15/2021 1018   GFRNONAA 18 04/23/2022 1536   GFRAA 47 (L) 09/13/2019 1420    Diabetic Labs (most recent): Lab Results  Component Value Date   HGBA1C 6.8 (A) 02/20/2023   HGBA1C 7.6 (A) 11/20/2022   HGBA1C 6.9 (A) 08/20/2022   MICROALBUR 150 08/20/2020   MICROALBUR 237.3 09/13/2019     Lipid Panel ( most recent) Lipid Panel     Component Value Date/Time   CHOL 177 06/09/2022 0948   TRIG 206 (H) 06/09/2022 0948   HDL 30 (L) 06/09/2022 0948   CHOLHDL 5.9 (H) 06/09/2022 0948   LDLCALC 111 (H) 06/09/2022 0948   LABVLDL  36 06/09/2022 0948     Assessment & Plan:   1) Type 2 diabetes mellitus with stage 3b chronic kidney disease, with long-term current use of insulin (HCC)  - Sabastion Hrdlicka has currently uncontrolled symptomatic type 2 DM since  68 years of age.   Recent labs reviewed.  He presents today with his CGM and logs showing improved, at goal glycemic profile overall.  His POCT A1c today is 6.2%, improving from last visit of 6.8%.  He denies any significant hypoglycemia.  Analysis of his CGM shows TIR 78%, TAR 21%, TBR 1% with a GMI of 6.9%.      - I had a long discussion with him about the progressive nature of diabetes and the pathology behind its complications. -his diabetes is complicated by CKD, peripheral neuropathy and he remains at a high risk for more acute and chronic complications which include CAD, CVA, CKD, retinopathy, and neuropathy. These are all discussed in detail with him.  - Nutritional counseling repeated at each appointment due to patients tendency to fall back in to old habits.  - The patient admits there is a room for improvement in their diet and drink choices. -  Suggestion is made for the patient to avoid simple carbohydrates from their diet including Cakes, Sweet Desserts / Pastries, Ice Cream, Soda (diet and regular), Sweet Tea, Candies, Chips, Cookies, Sweet Pastries, Store Bought Juices, Alcohol in Excess of 1-2 drinks a day, Artificial Sweeteners, Coffee Creamer, and "Sugar-free" Products. This will help patient to have stable blood glucose profile and potentially avoid unintended weight gain.   - I encouraged the patient to switch to unprocessed or minimally processed complex starch and increased protein intake (animal or plant source), fruits, and vegetables.   - Patient is advised to stick to a routine mealtimes to eat 3 meals a day and avoid unnecessary snacks (to snack only to correct hypoglycemia).  - I have approached him with the following individualized plan to  manage  his diabetes and patient agrees:   - he will continue to benefit from simplicity of premixed insulin.   -He is advised to adjust his Novolin 70/30 to 10 units with  breakfast and 5 units with supper if glucose is above 90 and he is eating.    -He is encouraged to continue using his CGM to monitor blood glucose 4 times per day, before meals and before bed, and call the clinic if he has readings less than 70 or greater than 300 for 3 tests in a row.  - Specific targets for  A1c;  LDL, HDL,  and Triglycerides were discussed with the patient.  2) Blood Pressure /Hypertension:  His blood pressure is controlled to target.  He is advised to continue Chlorthalidone 25 mg po daily as well as Amlodipine 10 mg po daily as needed.  3) Lipids/Hyperlipidemia:  His most recent lipid panel from 06/09/22 shows uncontrolled LDL of 111.  He is advised to continue Atorvastatin 40 mg po daily at bedtime.  Side effects and precautions discussed with him.    4)  Weight/Diet:  His Body mass index is 29.74 kg/m.     he is a candidate for some weight loss. I discussed with him the fact that loss of 5 - 10% of his  current body weight will have the most impact on his diabetes management.  Exercise, and detailed carbohydrates information provided  -  detailed on discharge instructions.  5)Hypothyroidism-acquired: There are no recent TFTs to review.  He is advised to continue his Levothyroxine 50 mcg po daily before breakfast.  Will recheck TFTs prior to next visit and adjust dose accordingly.   - The correct intake of thyroid hormone (Levothyroxine, Synthroid), is on empty stomach first thing in the morning, with water, separated by at least 30 minutes from breakfast and other medications,  and separated by more than 4 hours from calcium, iron, multivitamins, acid reflux medications (PPIs).  - This medication is a life-long medication and will be needed to correct thyroid hormone imbalances for the rest of your  life.  The dose may change from time to time, based on thyroid blood work.  - It is extremely important to be consistent taking this medication, near the same time each morning.  -AVOID TAKING PRODUCTS CONTAINING BIOTIN (commonly found in Hair, Skin, Nails vitamins) AS IT INTERFERES WITH THE VALIDITY OF THYROID FUNCTION BLOOD TESTS.  6) Chronic Care/Health Maintenance: -he is not on ARB (per nephrology) and is on Statin medications and is encouraged to initiate and continue to follow up with Ophthalmology, Dentist,  Podiatrist at least yearly or according to recommendations, and advised to stay away from smoking. I have recommended yearly flu vaccine and pneumonia vaccine at least every 5 years; moderate intensity exercise for up to 150 minutes weekly; and  sleep for at least 7 hours a day.  - he is advised to maintain close follow up with [name], DO for primary care needs, as well as his other providers for optimal and coordinated care  He is being worked up for HD in the future by nephrology.     I spent  31  minutes in the care of the patient today including review of labs from CMP, Lipids, Thyroid Function, Hematology (current and previous including abstractions from other facilities); face-to-face time discussing  his blood glucose readings/logs, discussing hypoglycemia and hyperglycemia episodes and symptoms, medications doses, his options of short and long term treatment based on the latest standards of care / guidelines;  discussion about incorporating lifestyle medicine;  and documenting the encounter. Risk reduction counseling performed per USPSTF guidelines to reduce obesity and cardiovascular risk factors.  Please refer to Patient Instructions for Blood Glucose Monitoring and Insulin/Medications Dosing Guide"  in media tab for additional information. Please  also refer to " Patient Self Inventory" in the Media  tab for reviewed elements of pertinent patient  history.  Christel Mormon participated in the discussions, expressed understanding, and voiced agreement with the above plans.  All questions were answered to his satisfaction. he is encouraged to contact clinic should he have any questions or concerns prior to his return visit.   Follow up plan: - Return in about 4 months (around 09/20/2023) for Diabetes F/U with A1c in office, Thyroid follow up, Previsit labs, Bring meter and logs.  Ronny Bacon, [facility] [address] [place], [place] [address] Phone: [phone] Fax: [phone]   05/21/2023, 11:52 AM

## 2023-05-25 DIAGNOSIS — N184 Chronic kidney disease, stage 4 (severe): Secondary | ICD-10-CM | POA: Diagnosis not present

## 2023-05-25 DIAGNOSIS — E1129 Type 2 diabetes mellitus with other diabetic kidney complication: Secondary | ICD-10-CM | POA: Diagnosis not present

## 2023-05-25 DIAGNOSIS — E1122 Type 2 diabetes mellitus with diabetic chronic kidney disease: Secondary | ICD-10-CM | POA: Diagnosis not present

## 2023-05-25 DIAGNOSIS — I129 Hypertensive chronic kidney disease with stage 1 through stage 4 chronic kidney disease, or unspecified chronic kidney disease: Secondary | ICD-10-CM | POA: Diagnosis not present

## 2023-06-03 DIAGNOSIS — H43823 Vitreomacular adhesion, bilateral: Secondary | ICD-10-CM | POA: Diagnosis not present

## 2023-06-03 DIAGNOSIS — H35033 Hypertensive retinopathy, bilateral: Secondary | ICD-10-CM | POA: Diagnosis not present

## 2023-06-03 DIAGNOSIS — Z961 Presence of intraocular lens: Secondary | ICD-10-CM | POA: Diagnosis not present

## 2023-06-03 DIAGNOSIS — E113313 Type 2 diabetes mellitus with moderate nonproliferative diabetic retinopathy with macular edema, bilateral: Secondary | ICD-10-CM | POA: Diagnosis not present

## 2023-06-03 DIAGNOSIS — H2512 Age-related nuclear cataract, left eye: Secondary | ICD-10-CM | POA: Diagnosis not present

## 2023-06-08 ENCOUNTER — Other Ambulatory Visit: Payer: Self-pay

## 2023-06-08 DIAGNOSIS — E039 Hypothyroidism, unspecified: Secondary | ICD-10-CM

## 2023-06-08 MED ORDER — LEVOTHYROXINE SODIUM 50 MCG PO TABS: 50.0000 ug | ORAL_TABLET | Freq: Every day | ORAL | 0 refills | Status: DC

## 2023-06-26 ENCOUNTER — Ambulatory Visit

## 2023-06-26 VITALS — BP 138/80 | HR 66 | Ht 69.0 in | Wt 201.0 lb

## 2023-06-26 DIAGNOSIS — Z0000 Encounter for general adult medical examination without abnormal findings: Secondary | ICD-10-CM | POA: Diagnosis not present

## 2023-06-26 NOTE — Progress Notes (Signed)
 Subjective:   Anthony French is a 68 y.o. who presents for a Medicare Wellness preventive visit.  As a reminder, Annual Wellness Visits don't include a physical exam, and some assessments may be limited, especially if this visit is performed virtually. We may recommend an in-person visit if needed.  Visit Complete: Virtual I connected with  Anthony French on 06/26/23 by a audio enabled telemedicine application and verified that I am speaking with the correct person using two identifiers.  Patient Location: Home  Provider Location: Home Office  I discussed the limitations of evaluation and management by telemedicine. The patient expressed understanding and agreed to proceed.  Vital Signs: Because this visit was a virtual/telehealth visit, some criteria may be missing or patient reported. Any vitals not documented were not able to be obtained and vitals that have been documented are patient reported.  VideoDeclined- This patient declined Librarian, academic. Therefore the visit was completed with audio only.  Persons Participating in Visit: Patient.  AWV Questionnaire: No: Patient Medicare AWV questionnaire was not completed prior to this visit.  Cardiac Risk Factors include: advanced age (>65men, >51 women);diabetes mellitus;male gender;dyslipidemia     Objective:     Today's Vitals   06/26/23 0851  BP: 138/80  Pulse: 66  Weight: 201 lb (91.2 kg)  Height: 5\' 9"  (1.753 m)   Body mass index is 29.68 kg/m.     06/26/2023    8:59 AM 12/09/2022    6:50 AM 04/07/2022    1:46 PM 01/17/2021   11:05 AM 09/01/2019    3:29 PM 05/26/2019    4:04 PM 05/11/2019   11:15 AM  Advanced Directives  Does Patient Have a Medical Advance Directive? No No No No No No No  Would patient like information on creating a medical advance directive?  No - Patient declined No - Patient declined No - Patient declined No - Patient declined No - Patient declined No - Patient declined     Current Medications (verified) Outpatient Encounter Medications as of 06/26/2023  Medication Sig   blood glucose meter kit and supplies Dispense based on patient and insurance preference. Use up to four times daily as directed. (FOR ICD-10 E10.9, E11.9). Check blood sugar twice a day as directed.   calcitRIOL (ROCALTROL) 0.25 MCG capsule Take 0.25 mcg by mouth every Monday, Wednesday, and Friday.   chlorthalidone (HYGROTON) 25 MG tablet Take 12.5 mg by mouth daily.   Continuous Glucose Receiver (FREESTYLE LIBRE 3 READER) DEVI 1 each by Does not apply route in the morning, at noon, in the evening, and at bedtime. Use to check BGs E11.9   Continuous Glucose Sensor (FREESTYLE LIBRE 3 SENSOR) MISC PLACE 1 SENSOR ON THE SKIN EVERY 14 DAYS. USE TO CHECK GLUCOSE CONTINUOUSLY E11.9   gabapentin  (NEURONTIN ) 300 MG capsule Take 1 capsule (300 mg total) by mouth 2 (two) times daily as needed (neuropathy (Dose change due to advanced kidney disease)).   glucose blood (ACCU-CHEK GUIDE) test strip Test blood sugar 4 times per day, before meals and at bedtime.   insulin  NPH-regular Human (70-30) 100 UNIT/ML injection Inject 15-20 Units into the skin See admin instructions. Inject 20 units before breakfast and 15 units before supper  06/26/23 per pt inj 10 before breakfast and 5 units before supper per pt   Insulin  Pen Needle (PEN NEEDLES) 31G X 8 MM MISC 1 each by Does not apply route in the morning and at bedtime.   Insulin  Syringe-Needle U-100 (BD INSULIN   SYRINGE U/F) 31G X 5/16" 0.3 ML MISC USE WITH INSULIN  TWICE  DAILY DX E11.22   levothyroxine  (SYNTHROID ) 50 MCG tablet Take 1 tablet (50 mcg total) by mouth daily before breakfast.   Multiple Vitamin (MULTIVITAMIN WITH MINERALS) TABS tablet Take 1 tablet by mouth daily.   NIFEdipine (ADALAT CC) 60 MG 24 hr tablet Take 60 mg by mouth 2 (two) times daily.   oxyCODONE -acetaminophen  (PERCOCET) 5-325 MG tablet Take 1 tablet by mouth every 6 (six) hours as needed  for severe pain (pain score 7-10). (Patient taking differently: Take 1 tablet by mouth every 6 (six) hours as needed for severe pain (pain score 7-10). Per pt not sure if taking or not 06/26/23)   rizatriptan  (MAXALT -MLT) 10 MG disintegrating tablet TAKE 1 TABLET BY MOUTH AS NEEDED FOR MIGRAINE. MAY REPEAT IN 2 HOURS IF NEEDED   sodium bicarbonate 650 MG tablet Take 650 mg by mouth 3 (three) times daily.   sodium zirconium cyclosilicate (LOKELMA) 10 g PACK packet Take 10 g by mouth daily.   No facility-administered encounter medications on file as of 06/26/2023.    Allergies (verified) Other and Sulfa  antibiotics   History: Past Medical History:  Diagnosis Date   Diabetes (HCC)    Diabetic neuropathy (HCC)    Essential hypertension, benign 06/01/2019   Hyperlipidemia    Past Surgical History:  Procedure Laterality Date   APPENDECTOMY  1980   AV FISTULA PLACEMENT Left 12/09/2022   Procedure: LEFT ARM BRACHIOCEPHALIC ARTERIOVENOUS (AV) FISTULA CREATION;  Surgeon: Kayla Part, MD;  Location: [facility] OR;  Service: Vascular;  Laterality: Left;   CYST REMOVAL TRUNK     Family History  Problem Relation Age of Onset   Stroke Mother    Diabetes Mother    Hypertension Mother    Hyperlipidemia Mother    Kidney disease Mother    COPD Father    Diabetes Sister    Stroke Brother    Diabetes Brother    Arthritis Maternal Grandmother    Social History   Socioeconomic History   Marital status: Single    Spouse name: Not on file   Number of children: 3   Years of education: Not on file   Highest education level: Not on file  Occupational History   Occupation: retired  Tobacco Use   Smoking status: Never   Smokeless tobacco: Never  Vaping Use   Vaping status: Never Used  Substance and Sexual Activity   Alcohol use: Not Currently   Drug use: Never   Sexual activity: Not Currently  Other Topics Concern   Not on file  Social History Narrative   Not on file   Social Drivers of  Health   Financial Resource Strain: Low Risk  (06/26/2023)   Overall Financial Resource Strain (CARDIA)    Difficulty of Paying Living Expenses: Not hard at all  Food Insecurity: No Food Insecurity (06/26/2023)   Hunger Vital Sign    Worried About Running Out of Food in the Last Year: Never true    Ran Out of Food in the Last Year: Never true  Transportation Needs: No Transportation Needs (06/26/2023)   PRAPARE - Administrator, Civil Service (Medical): No    Lack of Transportation (Non-Medical): No  Physical Activity: Insufficiently Active (06/26/2023)   Exercise Vital Sign    Days of Exercise per Week: 3 days    Minutes of Exercise per Session: 10 min  Stress: No Stress Concern Present (06/26/2023)   [place]  [employer] - Occupational Stress Questionnaire    Feeling of Stress : Not at all  Social Connections: Moderately Integrated (06/26/2023)   Social Connection and Isolation Panel [NHANES]    Frequency of Communication with Friends and Family: Twice a week    Frequency of Social Gatherings with Friends and Family: Twice a week    Attends Religious Services: More than 4 times per year    Active Member of [employer] or Organizations: Yes    Attends Banker Meetings: More than 4 times per year    Marital Status: Widowed    Tobacco Counseling Counseling given: Yes    Clinical Intake:  Pre-visit preparation completed: Yes  Pain : No/denies pain     BMI - recorded: 29.68 Nutritional Status: BMI 25 -29 Overweight Nutritional Risks: None Diabetes: Yes CBG done?: Yes (165 this morning per pt)  Lab Results  Component Value Date   HGBA1C 6.8 (A) 02/20/2023   HGBA1C 7.6 (A) 11/20/2022   HGBA1C 6.9 (A) 08/20/2022     How often do you need to have someone help you when you read instructions, pamphlets, or other written materials from your doctor or pharmacy?: 1 - Never  Interpreter Needed?: No  Information entered by :: Alia  t/cma   Activities of Daily Living     06/26/2023    8:55 AM 12/09/2022    6:49 AM  In your present state of health, do you have any difficulty performing the following activities:  Hearing? 1   Comment sometimes per pt   Vision? 0   Difficulty concentrating or making decisions? 0   Walking or climbing stairs? 0   Dressing or bathing? 0   Doing errands, shopping? 0 0  Preparing Food and eating ? N   Using the Toilet? N   In the past six months, have you accidently leaked urine? N   Do you have problems with loss of bowel control? N   Managing your Medications? N   Managing your Finances? N   Housekeeping or managing your Housekeeping? N     Patient Care Team: Eliodoro Guerin, DO as PCP - General (Family Medicine) Delilah Fend, [facility] (Pharmacist) Jane Meager, MD as Consulting Physician (Nephrology) Paulett Boros, MD as Consulting Physician (Hematology) Wendel Hals, NP as Nurse Practitioner (Nurse Practitioner)  Indicate any recent Medical Services you may have received from other than [facility] providers in the past year (date may be approximate).     Assessment:    This is a routine wellness examination for [facility]  Hearing/Vision screen Hearing Screening - Comments:: Per pt sometimes Vision Screening - Comments:: Pt wears readers/pt goes to [facility] Dartha Engel [facility]   Goals Addressed             This Visit's Progress    Patient Stated   On track    01/17/2021 AWV Goal: Exercise for General Health  Patient will verbalize understanding of the benefits of increased physical activity: Exercising regularly is important. It will improve your overall fitness, flexibility, and endurance. Regular exercise also will improve your overall health. It can help you control your weight, reduce stress, and improve your bone density. Over the next year, patient will increase physical activity as tolerated with a goal of at least 150  minutes of moderate physical activity per week.  You can tell that you are exercising at a moderate intensity if your heart starts beating faster and  you start breathing faster but can still hold a conversation. Moderate-intensity exercise ideas include: Walking 1 mile (1.6 km) in about 15 minutes Biking Hiking Golfing Dancing Water aerobics Patient will verbalize understanding of everyday activities that increase physical activity by providing examples like the following: Yard work, such as: Insurance underwriter Gardening Washing windows or floors Patient will be able to explain general safety guidelines for exercising:  Before you start a new exercise program, talk with your health care provider. Do not exercise so much that you hurt yourself, feel dizzy, or get very short of breath. Wear comfortable clothes and wear shoes with good support. Drink plenty of water while you exercise to prevent dehydration or heat stroke. Work out until your breathing and your heartbeat get faster.        Depression Screen     06/26/2023    9:12 AM 02/25/2023   11:09 AM 11/19/2022   11:45 AM 09/09/2022   10:45 AM 05/06/2022    2:03 PM 04/07/2022    1:46 PM 02/12/2022   10:20 AM  PHQ 2/9 Scores  PHQ - 2 Score 0 0 0 0 0 0 0  PHQ- 9 Score 1 0 0  0 0 0    Fall Risk     06/26/2023    8:59 AM 02/25/2023   11:09 AM 11/19/2022   11:45 AM 09/09/2022   10:45 AM 08/25/2022   11:19 AM  Fall Risk   Falls in the past year? 0 0 0 0 0  Number falls in past yr: 0 0 0  0  Injury with Fall? 0 0 0  0  Risk for fall due to : No Fall Risks No Fall Risks No Fall Risks  No Fall Risks  Follow up Falls prevention discussed;Falls evaluation completed Falls evaluation completed Education provided  Education provided    MEDICARE RISK AT HOME:  Medicare Risk at Home Any stairs in or around the home?: Yes If so, are there any without  handrails?: Yes Home free of loose throw rugs in walkways, pet beds, electrical cords, etc?: Yes Adequate lighting in your home to reduce risk of falls?: Yes Life alert?: No Use of a cane, walker or w/c?: No Grab bars in the bathroom?: No Shower chair or bench in shower?: Yes Elevated toilet seat or a handicapped toilet?: Yes  TIMED UP AND GO:  Was the test performed?  no  Cognitive Function: 6CIT completed        06/26/2023    9:14 AM 04/07/2022    1:47 PM 01/17/2021   11:08 AM  6CIT Screen  What Year? 0 points 0 points 0 points  What month? 0 points 0 points 0 points  What time? 0 points 0 points 0 points  Count back from 20 4 points 0 points 0 points  Months in reverse 4 points 0 points 0 points  Repeat phrase 2 points 0 points 0 points  Total Score 10 points 0 points 0 points    Immunizations Immunization History  Administered Date(s) Administered   Fluad Quad(high Dose 65+) 11/02/2019, 12/27/2020   Fluad Trivalent(High Dose 65+) 11/19/2022   Influenza Inj Mdck Quad With Preservative 11/26/2015   Influenza,inj,Quad PF,6+ Mos 12/25/2016   Pneumococcal Conjugate-13 12/27/2018   Pneumococcal Polysaccharide-23 04/16/2017   Tdap 08/19/2017   Zoster Recombinant(Shingrix) 07/28/2019, 09/07/2020    Screening Tests Health Maintenance  Topic Date Due   FOOT EXAM  04/29/2023   Pneumonia Vaccine 25+ Years old (3 of 3 - PPSV23, PCV20 or PCV21) 02/25/2024 (Originally 04/17/2022)   COVID-19 Vaccine (1) 07/11/2024 (Originally 11/27/1960)   HEMOGLOBIN A1C  08/20/2023   INFLUENZA VACCINE  09/11/2023   OPHTHALMOLOGY EXAM  03/30/2024   Medicare Annual Wellness (AWV)  06/25/2024   Fecal DNA (Cologuard)  01/21/2026   DTaP/Tdap/Td (2 - Td or Tdap) 08/20/2027   Hepatitis C Screening  Completed   Zoster Vaccines- Shingrix  Completed   HPV VACCINES  Aged Out   Meningococcal B Vaccine  Aged Out    Health Maintenance  Health Maintenance Due  Topic Date Due   FOOT EXAM  04/29/2023    Health Maintenance Items Addressed: See Nurse Notes  Additional Screening:  Vision Screening: Recommended annual ophthalmology exams for early detection of glaucoma and other disorders of the eye.  Dental Screening: Recommended annual dental exams for proper oral hygiene  Community Resource Referral / Chronic Care Management: CRR required this visit?  No   CCM required this visit?  No   Plan:    I have personally reviewed and noted the following in the patient's chart:   Medical and social history Use of alcohol, tobacco or illicit drugs  Current medications and supplements including opioid prescriptions. Patient is not currently taking opioid prescriptions. Functional ability and status Nutritional status Physical activity Advanced directives List of other physicians Hospitalizations, surgeries, and ER visits in previous 12 months Vitals Screenings to include cognitive, depression, and falls Referrals and appointments  In addition, I have reviewed and discussed with patient certain preventive protocols, quality metrics, and best practice recommendations. A written personalized care plan for preventive services as well as general preventive health recommendations were provided to patient.   Michaelle Adolphus, CMA   06/26/2023   After Visit Summary: (Declined) Due to this being a telephonic visit, with patients personalized plan was offered to patient but patient Declined AVS at this time   Notes: FYI PCP: Please don't forget to discuss with your provider about the getting your foot exam and lab work  Notes: FYI PCP:

## 2023-06-26 NOTE — Patient Instructions (Signed)
 Mr. Anthony French , Thank you for taking time out of your busy schedule to complete your Annual Wellness Visit with me. I enjoyed our conversation and look forward to speaking with you again next year. I, as well as your care team,  appreciate your ongoing commitment to your health goals. Please review the following plan we discussed and let me know if I can assist you in the future. Your Game plan/ To Do List    Follow up Visits: Next Medicare AWV with our clinical staff: Tuesday, 06/28/24 at 8:40a.m.   Have you seen your provider in the last 6 months (3 months if uncontrolled diabetes)? Yes Next Office Visit with your provider: 10/19/23 at 11:00a.m.  Clinician Recommendations:  Aim for 30 minutes of exercise or brisk walking, 6-8 glasses of water, and 5 servings of fruits and vegetables each day. Please don't forget to discuss with your provider about the getting your foot exam and lab work done at your next visit. If you have any questions please contact our office at [phone].      This is a list of the screening recommended for you and due dates:  Health Maintenance  Topic Date Due   COVID-19 Vaccine (1) Never done   Complete foot exam   04/29/2023   Pneumonia Vaccine (3 of 3 - PPSV23, PCV20 or PCV21) 02/25/2024*   Hemoglobin A1C  08/20/2023   Flu Shot  09/11/2023   Eye exam for diabetics  03/30/2024   Medicare Annual Wellness Visit  06/25/2024   Cologuard (Stool DNA test)  01/21/2026   DTaP/Tdap/Td vaccine (2 - Td or Tdap) 08/20/2027   Hepatitis C Screening  Completed   Zoster (Shingles) Vaccine  Completed   HPV Vaccine  Aged Out   Meningitis B Vaccine  Aged Out  *Topic was postponed. The date shown is not the original due date.    Advanced directives: (Declined) Advance directive discussed with you today. Even though you declined this today, please call our office should you change your mind, and we can give you the proper paperwork for you to fill out. Advance Care Planning is  important because it:  [x]  Makes sure you receive the medical care that is consistent with your values, goals, and preferences  [x]  It provides guidance to your family and loved ones and reduces their decisional burden about whether or not they are making the right decisions based on your wishes.  Follow the link provided in your after visit summary or read over the paperwork we have mailed to you to help you started getting your Advance Directives in place. If you need assistance in completing these, please reach out to us  so that we can help you!  See attachments for Preventive Care and Fall Prevention Tips.

## 2023-06-29 ENCOUNTER — Other Ambulatory Visit

## 2023-06-29 DIAGNOSIS — E1122 Type 2 diabetes mellitus with diabetic chronic kidney disease: Secondary | ICD-10-CM | POA: Diagnosis not present

## 2023-06-29 DIAGNOSIS — E1159 Type 2 diabetes mellitus with other circulatory complications: Secondary | ICD-10-CM

## 2023-06-29 DIAGNOSIS — E785 Hyperlipidemia, unspecified: Secondary | ICD-10-CM | POA: Diagnosis not present

## 2023-06-29 DIAGNOSIS — E1169 Type 2 diabetes mellitus with other specified complication: Secondary | ICD-10-CM | POA: Diagnosis not present

## 2023-06-29 DIAGNOSIS — R809 Proteinuria, unspecified: Secondary | ICD-10-CM | POA: Diagnosis not present

## 2023-06-29 DIAGNOSIS — N189 Chronic kidney disease, unspecified: Secondary | ICD-10-CM | POA: Diagnosis not present

## 2023-06-29 DIAGNOSIS — D631 Anemia in chronic kidney disease: Secondary | ICD-10-CM | POA: Diagnosis not present

## 2023-06-29 DIAGNOSIS — I152 Hypertension secondary to endocrine disorders: Secondary | ICD-10-CM | POA: Diagnosis not present

## 2023-06-29 DIAGNOSIS — Z794 Long term (current) use of insulin: Secondary | ICD-10-CM | POA: Diagnosis not present

## 2023-06-29 LAB — LIPID PANEL

## 2023-06-29 LAB — BAYER DCA HB A1C WAIVED: HB A1C (BAYER DCA - WAIVED): 6.2 % — ABNORMAL HIGH (ref 4.8–5.6)

## 2023-06-30 ENCOUNTER — Ambulatory Visit: Payer: Self-pay | Admitting: Family Medicine

## 2023-06-30 ENCOUNTER — Telehealth: Payer: Self-pay

## 2023-06-30 LAB — CBC WITH DIFFERENTIAL/PLATELET
Basophils Absolute: 0 10*3/uL (ref 0.0–0.2)
Basos: 0 %
EOS (ABSOLUTE): 0.2 10*3/uL (ref 0.0–0.4)
Eos: 3 %
Hematocrit: 28.7 % — ABNORMAL LOW (ref 37.5–51.0)
Hemoglobin: 9.6 g/dL — ABNORMAL LOW (ref 13.0–17.7)
Immature Grans (Abs): 0 10*3/uL (ref 0.0–0.1)
Immature Granulocytes: 0 %
Lymphocytes Absolute: 1 10*3/uL (ref 0.7–3.1)
Lymphs: 21 %
MCH: 30.1 pg (ref 26.6–33.0)
MCHC: 33.4 g/dL (ref 31.5–35.7)
MCV: 90 fL (ref 79–97)
Monocytes Absolute: 0.5 10*3/uL (ref 0.1–0.9)
Monocytes: 11 %
Neutrophils Absolute: 3.1 10*3/uL (ref 1.4–7.0)
Neutrophils: 65 %
Platelets: 178 10*3/uL (ref 150–450)
RBC: 3.19 x10E6/uL — ABNORMAL LOW (ref 4.14–5.80)
RDW: 13.1 % (ref 11.6–15.4)
WBC: 4.8 10*3/uL (ref 3.4–10.8)

## 2023-06-30 LAB — CMP14+EGFR
ALT: 11 IU/L (ref 0–44)
AST: 11 IU/L (ref 0–40)
Albumin: 4.3 g/dL (ref 3.9–4.9)
Alkaline Phosphatase: 78 IU/L (ref 44–121)
BUN/Creatinine Ratio: 15 (ref 10–24)
BUN: 67 mg/dL — ABNORMAL HIGH (ref 8–27)
Bilirubin Total: 0.4 mg/dL (ref 0.0–1.2)
CO2: 16 mmol/L — ABNORMAL LOW (ref 20–29)
Calcium: 9.3 mg/dL (ref 8.6–10.2)
Chloride: 109 mmol/L — ABNORMAL HIGH (ref 96–106)
Creatinine, Ser: 4.33 mg/dL (ref 0.76–1.27)
Globulin, Total: 2.2 g/dL (ref 1.5–4.5)
Glucose: 157 mg/dL — ABNORMAL HIGH (ref 70–99)
Potassium: 4.6 mmol/L (ref 3.5–5.2)
Sodium: 144 mmol/L (ref 134–144)
Total Protein: 6.5 g/dL (ref 6.0–8.5)
eGFR: 14 mL/min/{1.73_m2} — ABNORMAL LOW (ref >59)

## 2023-06-30 LAB — LIPID PANEL
Chol/HDL Ratio: 5.7 ratio — ABNORMAL HIGH (ref 0.0–5.0)
Cholesterol, Total: 188 mg/dL (ref 100–199)
HDL: 33 mg/dL — ABNORMAL LOW (ref >39)
LDL Chol Calc (NIH): 131 mg/dL — ABNORMAL HIGH (ref 0–99)
Triglycerides: 134 mg/dL (ref 0–149)
VLDL Cholesterol Cal: 24 mg/dL (ref 5–40)

## 2023-06-30 NOTE — Telephone Encounter (Signed)
 I have already addressed that.  Please see lab results that were sent to the pool earlier this morning

## 2023-06-30 NOTE — Telephone Encounter (Signed)
 fyi

## 2023-07-01 ENCOUNTER — Other Ambulatory Visit: Payer: Self-pay | Admitting: Family Medicine

## 2023-07-01 DIAGNOSIS — E1169 Type 2 diabetes mellitus with other specified complication: Secondary | ICD-10-CM

## 2023-07-01 MED ORDER — ATORVASTATIN CALCIUM 40 MG PO TABS: 40.0000 mg | ORAL_TABLET | Freq: Every day | ORAL | 4 refills | Status: DC

## 2023-07-01 NOTE — Progress Notes (Signed)
 I was under the assumption that we was still taking it.  Not sure what happened there.

## 2023-07-02 DIAGNOSIS — E113313 Type 2 diabetes mellitus with moderate nonproliferative diabetic retinopathy with macular edema, bilateral: Secondary | ICD-10-CM | POA: Diagnosis not present

## 2023-07-07 DIAGNOSIS — E1122 Type 2 diabetes mellitus with diabetic chronic kidney disease: Secondary | ICD-10-CM | POA: Diagnosis not present

## 2023-07-07 DIAGNOSIS — R809 Proteinuria, unspecified: Secondary | ICD-10-CM | POA: Diagnosis not present

## 2023-07-07 DIAGNOSIS — N185 Chronic kidney disease, stage 5: Secondary | ICD-10-CM | POA: Diagnosis not present

## 2023-07-07 DIAGNOSIS — E1129 Type 2 diabetes mellitus with other diabetic kidney complication: Secondary | ICD-10-CM | POA: Diagnosis not present

## 2023-07-10 ENCOUNTER — Ambulatory Visit: Payer: Medicare Other | Admitting: Podiatry

## 2023-07-10 DIAGNOSIS — R1110 Vomiting, unspecified: Secondary | ICD-10-CM | POA: Diagnosis not present

## 2023-07-10 DIAGNOSIS — R0602 Shortness of breath: Secondary | ICD-10-CM | POA: Diagnosis not present

## 2023-07-10 DIAGNOSIS — R0989 Other specified symptoms and signs involving the circulatory and respiratory systems: Secondary | ICD-10-CM | POA: Diagnosis not present

## 2023-07-10 DIAGNOSIS — I97190 Other postprocedural cardiac functional disturbances following cardiac surgery: Secondary | ICD-10-CM | POA: Diagnosis not present

## 2023-07-10 DIAGNOSIS — I6521 Occlusion and stenosis of right carotid artery: Secondary | ICD-10-CM | POA: Diagnosis not present

## 2023-07-10 DIAGNOSIS — N179 Acute kidney failure, unspecified: Secondary | ICD-10-CM | POA: Diagnosis not present

## 2023-07-10 DIAGNOSIS — E1165 Type 2 diabetes mellitus with hyperglycemia: Secondary | ICD-10-CM | POA: Diagnosis not present

## 2023-07-10 DIAGNOSIS — I5033 Acute on chronic diastolic (congestive) heart failure: Secondary | ICD-10-CM | POA: Diagnosis not present

## 2023-07-10 DIAGNOSIS — Z91199 Patient's noncompliance with other medical treatment and regimen due to unspecified reason: Secondary | ICD-10-CM

## 2023-07-10 DIAGNOSIS — R079 Chest pain, unspecified: Secondary | ICD-10-CM | POA: Diagnosis not present

## 2023-07-10 DIAGNOSIS — I214 Non-ST elevation (NSTEMI) myocardial infarction: Secondary | ICD-10-CM | POA: Diagnosis not present

## 2023-07-10 DIAGNOSIS — J9811 Atelectasis: Secondary | ICD-10-CM | POA: Diagnosis not present

## 2023-07-10 DIAGNOSIS — K5900 Constipation, unspecified: Secondary | ICD-10-CM | POA: Diagnosis not present

## 2023-07-10 DIAGNOSIS — R000 Tachycardia, unspecified: Secondary | ICD-10-CM | POA: Diagnosis not present

## 2023-07-10 DIAGNOSIS — I509 Heart failure, unspecified: Secondary | ICD-10-CM | POA: Diagnosis not present

## 2023-07-10 DIAGNOSIS — E785 Hyperlipidemia, unspecified: Secondary | ICD-10-CM | POA: Diagnosis not present

## 2023-07-10 DIAGNOSIS — G43909 Migraine, unspecified, not intractable, without status migrainosus: Secondary | ICD-10-CM | POA: Diagnosis not present

## 2023-07-10 DIAGNOSIS — D62 Acute posthemorrhagic anemia: Secondary | ICD-10-CM | POA: Diagnosis not present

## 2023-07-10 DIAGNOSIS — I2510 Atherosclerotic heart disease of native coronary artery without angina pectoris: Secondary | ICD-10-CM | POA: Diagnosis not present

## 2023-07-10 DIAGNOSIS — J811 Chronic pulmonary edema: Secondary | ICD-10-CM | POA: Diagnosis not present

## 2023-07-10 DIAGNOSIS — J90 Pleural effusion, not elsewhere classified: Secondary | ICD-10-CM | POA: Diagnosis not present

## 2023-07-10 DIAGNOSIS — E1142 Type 2 diabetes mellitus with diabetic polyneuropathy: Secondary | ICD-10-CM | POA: Diagnosis not present

## 2023-07-10 DIAGNOSIS — Z01811 Encounter for preprocedural respiratory examination: Secondary | ICD-10-CM | POA: Diagnosis not present

## 2023-07-10 DIAGNOSIS — I871 Compression of vein: Secondary | ICD-10-CM | POA: Diagnosis not present

## 2023-07-10 DIAGNOSIS — Z992 Dependence on renal dialysis: Secondary | ICD-10-CM | POA: Diagnosis not present

## 2023-07-10 DIAGNOSIS — I493 Ventricular premature depolarization: Secondary | ICD-10-CM | POA: Diagnosis not present

## 2023-07-10 DIAGNOSIS — E039 Hypothyroidism, unspecified: Secondary | ICD-10-CM | POA: Diagnosis not present

## 2023-07-10 DIAGNOSIS — N186 End stage renal disease: Secondary | ICD-10-CM | POA: Diagnosis not present

## 2023-07-10 DIAGNOSIS — J984 Other disorders of lung: Secondary | ICD-10-CM | POA: Diagnosis not present

## 2023-07-10 DIAGNOSIS — I10 Essential (primary) hypertension: Secondary | ICD-10-CM | POA: Diagnosis not present

## 2023-07-10 DIAGNOSIS — R509 Fever, unspecified: Secondary | ICD-10-CM | POA: Diagnosis not present

## 2023-07-10 DIAGNOSIS — I5030 Unspecified diastolic (congestive) heart failure: Secondary | ICD-10-CM | POA: Diagnosis not present

## 2023-07-10 DIAGNOSIS — M549 Dorsalgia, unspecified: Secondary | ICD-10-CM | POA: Diagnosis not present

## 2023-07-10 DIAGNOSIS — Z01810 Encounter for preprocedural cardiovascular examination: Secondary | ICD-10-CM | POA: Diagnosis not present

## 2023-07-10 DIAGNOSIS — I21A1 Myocardial infarction type 2: Secondary | ICD-10-CM | POA: Diagnosis not present

## 2023-07-10 DIAGNOSIS — J9601 Acute respiratory failure with hypoxia: Secondary | ICD-10-CM | POA: Diagnosis not present

## 2023-07-10 DIAGNOSIS — D631 Anemia in chronic kidney disease: Secondary | ICD-10-CM | POA: Diagnosis not present

## 2023-07-10 DIAGNOSIS — R918 Other nonspecific abnormal finding of lung field: Secondary | ICD-10-CM | POA: Diagnosis not present

## 2023-07-10 DIAGNOSIS — I340 Nonrheumatic mitral (valve) insufficiency: Secondary | ICD-10-CM | POA: Diagnosis not present

## 2023-07-10 DIAGNOSIS — E1122 Type 2 diabetes mellitus with diabetic chronic kidney disease: Secondary | ICD-10-CM | POA: Diagnosis not present

## 2023-07-10 DIAGNOSIS — N185 Chronic kidney disease, stage 5: Secondary | ICD-10-CM | POA: Diagnosis not present

## 2023-07-10 DIAGNOSIS — I25110 Atherosclerotic heart disease of native coronary artery with unstable angina pectoris: Secondary | ICD-10-CM | POA: Diagnosis not present

## 2023-07-10 DIAGNOSIS — I110 Hypertensive heart disease with heart failure: Secondary | ICD-10-CM | POA: Diagnosis not present

## 2023-07-10 DIAGNOSIS — Z794 Long term (current) use of insulin: Secondary | ICD-10-CM | POA: Diagnosis not present

## 2023-07-10 DIAGNOSIS — T82898A Other specified complication of vascular prosthetic devices, implants and grafts, initial encounter: Secondary | ICD-10-CM | POA: Diagnosis not present

## 2023-07-10 DIAGNOSIS — I132 Hypertensive heart and chronic kidney disease with heart failure and with stage 5 chronic kidney disease, or end stage renal disease: Secondary | ICD-10-CM | POA: Diagnosis not present

## 2023-07-10 DIAGNOSIS — I160 Hypertensive urgency: Secondary | ICD-10-CM | POA: Diagnosis not present

## 2023-07-10 DIAGNOSIS — D649 Anemia, unspecified: Secondary | ICD-10-CM | POA: Diagnosis not present

## 2023-07-10 DIAGNOSIS — I209 Angina pectoris, unspecified: Secondary | ICD-10-CM | POA: Diagnosis not present

## 2023-07-10 NOTE — Progress Notes (Signed)
 No show

## 2023-07-11 DIAGNOSIS — I5030 Unspecified diastolic (congestive) heart failure: Secondary | ICD-10-CM | POA: Diagnosis not present

## 2023-07-11 DIAGNOSIS — D649 Anemia, unspecified: Secondary | ICD-10-CM | POA: Diagnosis not present

## 2023-07-11 DIAGNOSIS — I214 Non-ST elevation (NSTEMI) myocardial infarction: Secondary | ICD-10-CM | POA: Diagnosis not present

## 2023-07-11 DIAGNOSIS — E785 Hyperlipidemia, unspecified: Secondary | ICD-10-CM | POA: Diagnosis not present

## 2023-07-11 DIAGNOSIS — N185 Chronic kidney disease, stage 5: Secondary | ICD-10-CM | POA: Diagnosis not present

## 2023-07-11 DIAGNOSIS — I10 Essential (primary) hypertension: Secondary | ICD-10-CM | POA: Diagnosis not present

## 2023-07-12 DIAGNOSIS — I5030 Unspecified diastolic (congestive) heart failure: Secondary | ICD-10-CM | POA: Diagnosis not present

## 2023-07-12 DIAGNOSIS — J811 Chronic pulmonary edema: Secondary | ICD-10-CM | POA: Diagnosis not present

## 2023-07-12 DIAGNOSIS — N185 Chronic kidney disease, stage 5: Secondary | ICD-10-CM | POA: Diagnosis not present

## 2023-07-12 DIAGNOSIS — I214 Non-ST elevation (NSTEMI) myocardial infarction: Secondary | ICD-10-CM | POA: Diagnosis not present

## 2023-07-12 DIAGNOSIS — D649 Anemia, unspecified: Secondary | ICD-10-CM | POA: Diagnosis not present

## 2023-07-12 DIAGNOSIS — R918 Other nonspecific abnormal finding of lung field: Secondary | ICD-10-CM | POA: Diagnosis not present

## 2023-07-12 DIAGNOSIS — I10 Essential (primary) hypertension: Secondary | ICD-10-CM | POA: Diagnosis not present

## 2023-07-12 DIAGNOSIS — E785 Hyperlipidemia, unspecified: Secondary | ICD-10-CM | POA: Diagnosis not present

## 2023-07-13 DIAGNOSIS — N185 Chronic kidney disease, stage 5: Secondary | ICD-10-CM | POA: Diagnosis not present

## 2023-07-13 DIAGNOSIS — I209 Angina pectoris, unspecified: Secondary | ICD-10-CM | POA: Diagnosis not present

## 2023-07-13 DIAGNOSIS — R1110 Vomiting, unspecified: Secondary | ICD-10-CM | POA: Diagnosis not present

## 2023-07-13 DIAGNOSIS — I5030 Unspecified diastolic (congestive) heart failure: Secondary | ICD-10-CM | POA: Diagnosis not present

## 2023-07-13 DIAGNOSIS — R0602 Shortness of breath: Secondary | ICD-10-CM | POA: Diagnosis not present

## 2023-07-13 DIAGNOSIS — J984 Other disorders of lung: Secondary | ICD-10-CM | POA: Diagnosis not present

## 2023-07-13 DIAGNOSIS — I10 Essential (primary) hypertension: Secondary | ICD-10-CM | POA: Diagnosis not present

## 2023-07-13 DIAGNOSIS — E785 Hyperlipidemia, unspecified: Secondary | ICD-10-CM | POA: Diagnosis not present

## 2023-07-13 DIAGNOSIS — I214 Non-ST elevation (NSTEMI) myocardial infarction: Secondary | ICD-10-CM | POA: Diagnosis not present

## 2023-07-13 DIAGNOSIS — I509 Heart failure, unspecified: Secondary | ICD-10-CM | POA: Diagnosis not present

## 2023-07-13 DIAGNOSIS — D649 Anemia, unspecified: Secondary | ICD-10-CM | POA: Diagnosis not present

## 2023-07-14 DIAGNOSIS — R918 Other nonspecific abnormal finding of lung field: Secondary | ICD-10-CM | POA: Diagnosis not present

## 2023-07-14 DIAGNOSIS — M549 Dorsalgia, unspecified: Secondary | ICD-10-CM | POA: Diagnosis not present

## 2023-07-14 DIAGNOSIS — N185 Chronic kidney disease, stage 5: Secondary | ICD-10-CM | POA: Diagnosis not present

## 2023-07-14 DIAGNOSIS — E785 Hyperlipidemia, unspecified: Secondary | ICD-10-CM | POA: Diagnosis not present

## 2023-07-14 DIAGNOSIS — R0602 Shortness of breath: Secondary | ICD-10-CM | POA: Diagnosis not present

## 2023-07-14 DIAGNOSIS — I10 Essential (primary) hypertension: Secondary | ICD-10-CM | POA: Diagnosis not present

## 2023-07-14 DIAGNOSIS — I2510 Atherosclerotic heart disease of native coronary artery without angina pectoris: Secondary | ICD-10-CM | POA: Diagnosis not present

## 2023-07-14 DIAGNOSIS — I214 Non-ST elevation (NSTEMI) myocardial infarction: Secondary | ICD-10-CM | POA: Diagnosis not present

## 2023-07-14 DIAGNOSIS — D649 Anemia, unspecified: Secondary | ICD-10-CM | POA: Diagnosis not present

## 2023-07-14 DIAGNOSIS — I5030 Unspecified diastolic (congestive) heart failure: Secondary | ICD-10-CM | POA: Diagnosis not present

## 2023-07-14 DIAGNOSIS — R079 Chest pain, unspecified: Secondary | ICD-10-CM | POA: Diagnosis not present

## 2023-07-15 DIAGNOSIS — R079 Chest pain, unspecified: Secondary | ICD-10-CM | POA: Diagnosis not present

## 2023-07-15 DIAGNOSIS — I10 Essential (primary) hypertension: Secondary | ICD-10-CM | POA: Diagnosis not present

## 2023-07-15 DIAGNOSIS — D649 Anemia, unspecified: Secondary | ICD-10-CM | POA: Diagnosis not present

## 2023-07-15 DIAGNOSIS — E785 Hyperlipidemia, unspecified: Secondary | ICD-10-CM | POA: Diagnosis not present

## 2023-07-15 DIAGNOSIS — I214 Non-ST elevation (NSTEMI) myocardial infarction: Secondary | ICD-10-CM | POA: Diagnosis not present

## 2023-07-15 DIAGNOSIS — I5030 Unspecified diastolic (congestive) heart failure: Secondary | ICD-10-CM | POA: Diagnosis not present

## 2023-07-15 DIAGNOSIS — N185 Chronic kidney disease, stage 5: Secondary | ICD-10-CM | POA: Diagnosis not present

## 2023-07-15 DIAGNOSIS — R0602 Shortness of breath: Secondary | ICD-10-CM | POA: Diagnosis not present

## 2023-07-15 DIAGNOSIS — J811 Chronic pulmonary edema: Secondary | ICD-10-CM | POA: Diagnosis not present

## 2023-07-16 DIAGNOSIS — N185 Chronic kidney disease, stage 5: Secondary | ICD-10-CM | POA: Diagnosis not present

## 2023-07-16 DIAGNOSIS — N179 Acute kidney failure, unspecified: Secondary | ICD-10-CM | POA: Diagnosis not present

## 2023-07-16 DIAGNOSIS — I214 Non-ST elevation (NSTEMI) myocardial infarction: Secondary | ICD-10-CM | POA: Diagnosis not present

## 2023-07-16 DIAGNOSIS — I10 Essential (primary) hypertension: Secondary | ICD-10-CM | POA: Diagnosis not present

## 2023-07-16 DIAGNOSIS — I5030 Unspecified diastolic (congestive) heart failure: Secondary | ICD-10-CM | POA: Diagnosis not present

## 2023-07-16 DIAGNOSIS — D649 Anemia, unspecified: Secondary | ICD-10-CM | POA: Diagnosis not present

## 2023-07-16 DIAGNOSIS — I509 Heart failure, unspecified: Secondary | ICD-10-CM | POA: Diagnosis not present

## 2023-07-16 DIAGNOSIS — I209 Angina pectoris, unspecified: Secondary | ICD-10-CM | POA: Diagnosis not present

## 2023-07-16 DIAGNOSIS — E785 Hyperlipidemia, unspecified: Secondary | ICD-10-CM | POA: Diagnosis not present

## 2023-07-16 DIAGNOSIS — I25110 Atherosclerotic heart disease of native coronary artery with unstable angina pectoris: Secondary | ICD-10-CM | POA: Diagnosis not present

## 2023-07-17 DIAGNOSIS — E785 Hyperlipidemia, unspecified: Secondary | ICD-10-CM | POA: Diagnosis not present

## 2023-07-17 DIAGNOSIS — I10 Essential (primary) hypertension: Secondary | ICD-10-CM | POA: Diagnosis not present

## 2023-07-17 DIAGNOSIS — I214 Non-ST elevation (NSTEMI) myocardial infarction: Secondary | ICD-10-CM | POA: Diagnosis not present

## 2023-07-17 DIAGNOSIS — Z01811 Encounter for preprocedural respiratory examination: Secondary | ICD-10-CM | POA: Diagnosis not present

## 2023-07-17 DIAGNOSIS — N185 Chronic kidney disease, stage 5: Secondary | ICD-10-CM | POA: Diagnosis not present

## 2023-07-17 DIAGNOSIS — I5030 Unspecified diastolic (congestive) heart failure: Secondary | ICD-10-CM | POA: Diagnosis not present

## 2023-07-17 DIAGNOSIS — I509 Heart failure, unspecified: Secondary | ICD-10-CM | POA: Diagnosis not present

## 2023-07-17 DIAGNOSIS — D649 Anemia, unspecified: Secondary | ICD-10-CM | POA: Diagnosis not present

## 2023-07-17 DIAGNOSIS — N179 Acute kidney failure, unspecified: Secondary | ICD-10-CM | POA: Diagnosis not present

## 2023-07-18 DIAGNOSIS — I509 Heart failure, unspecified: Secondary | ICD-10-CM | POA: Diagnosis not present

## 2023-07-18 DIAGNOSIS — I214 Non-ST elevation (NSTEMI) myocardial infarction: Secondary | ICD-10-CM | POA: Diagnosis not present

## 2023-07-18 DIAGNOSIS — I6521 Occlusion and stenosis of right carotid artery: Secondary | ICD-10-CM | POA: Diagnosis not present

## 2023-07-18 DIAGNOSIS — N185 Chronic kidney disease, stage 5: Secondary | ICD-10-CM | POA: Diagnosis not present

## 2023-07-18 DIAGNOSIS — N179 Acute kidney failure, unspecified: Secondary | ICD-10-CM | POA: Diagnosis not present

## 2023-07-18 DIAGNOSIS — I209 Angina pectoris, unspecified: Secondary | ICD-10-CM | POA: Diagnosis not present

## 2023-07-18 DIAGNOSIS — I5030 Unspecified diastolic (congestive) heart failure: Secondary | ICD-10-CM | POA: Diagnosis not present

## 2023-07-18 DIAGNOSIS — D649 Anemia, unspecified: Secondary | ICD-10-CM | POA: Diagnosis not present

## 2023-07-18 DIAGNOSIS — I10 Essential (primary) hypertension: Secondary | ICD-10-CM | POA: Diagnosis not present

## 2023-07-18 DIAGNOSIS — Z01810 Encounter for preprocedural cardiovascular examination: Secondary | ICD-10-CM | POA: Diagnosis not present

## 2023-07-18 DIAGNOSIS — E785 Hyperlipidemia, unspecified: Secondary | ICD-10-CM | POA: Diagnosis not present

## 2023-07-19 DIAGNOSIS — I10 Essential (primary) hypertension: Secondary | ICD-10-CM | POA: Diagnosis not present

## 2023-07-19 DIAGNOSIS — D649 Anemia, unspecified: Secondary | ICD-10-CM | POA: Diagnosis not present

## 2023-07-19 DIAGNOSIS — I209 Angina pectoris, unspecified: Secondary | ICD-10-CM | POA: Diagnosis not present

## 2023-07-19 DIAGNOSIS — I509 Heart failure, unspecified: Secondary | ICD-10-CM | POA: Diagnosis not present

## 2023-07-19 DIAGNOSIS — I214 Non-ST elevation (NSTEMI) myocardial infarction: Secondary | ICD-10-CM | POA: Diagnosis not present

## 2023-07-19 DIAGNOSIS — N179 Acute kidney failure, unspecified: Secondary | ICD-10-CM | POA: Diagnosis not present

## 2023-07-19 DIAGNOSIS — E785 Hyperlipidemia, unspecified: Secondary | ICD-10-CM | POA: Diagnosis not present

## 2023-07-19 DIAGNOSIS — I5030 Unspecified diastolic (congestive) heart failure: Secondary | ICD-10-CM | POA: Diagnosis not present

## 2023-07-20 DIAGNOSIS — N179 Acute kidney failure, unspecified: Secondary | ICD-10-CM | POA: Diagnosis not present

## 2023-07-20 DIAGNOSIS — I214 Non-ST elevation (NSTEMI) myocardial infarction: Secondary | ICD-10-CM | POA: Diagnosis not present

## 2023-07-20 DIAGNOSIS — I25110 Atherosclerotic heart disease of native coronary artery with unstable angina pectoris: Secondary | ICD-10-CM | POA: Diagnosis not present

## 2023-07-20 DIAGNOSIS — I340 Nonrheumatic mitral (valve) insufficiency: Secondary | ICD-10-CM | POA: Diagnosis not present

## 2023-07-20 DIAGNOSIS — R918 Other nonspecific abnormal finding of lung field: Secondary | ICD-10-CM | POA: Diagnosis not present

## 2023-07-20 DIAGNOSIS — I2510 Atherosclerotic heart disease of native coronary artery without angina pectoris: Secondary | ICD-10-CM | POA: Diagnosis not present

## 2023-07-20 DIAGNOSIS — I509 Heart failure, unspecified: Secondary | ICD-10-CM | POA: Diagnosis not present

## 2023-07-20 DIAGNOSIS — D649 Anemia, unspecified: Secondary | ICD-10-CM | POA: Diagnosis not present

## 2023-07-21 DIAGNOSIS — J90 Pleural effusion, not elsewhere classified: Secondary | ICD-10-CM | POA: Diagnosis not present

## 2023-07-21 DIAGNOSIS — D649 Anemia, unspecified: Secondary | ICD-10-CM | POA: Diagnosis not present

## 2023-07-21 DIAGNOSIS — I214 Non-ST elevation (NSTEMI) myocardial infarction: Secondary | ICD-10-CM | POA: Diagnosis not present

## 2023-07-21 DIAGNOSIS — N179 Acute kidney failure, unspecified: Secondary | ICD-10-CM | POA: Diagnosis not present

## 2023-07-21 DIAGNOSIS — N185 Chronic kidney disease, stage 5: Secondary | ICD-10-CM | POA: Diagnosis not present

## 2023-07-21 DIAGNOSIS — J9811 Atelectasis: Secondary | ICD-10-CM | POA: Diagnosis not present

## 2023-07-22 DIAGNOSIS — N179 Acute kidney failure, unspecified: Secondary | ICD-10-CM | POA: Diagnosis not present

## 2023-07-22 DIAGNOSIS — J90 Pleural effusion, not elsewhere classified: Secondary | ICD-10-CM | POA: Diagnosis not present

## 2023-07-22 DIAGNOSIS — I214 Non-ST elevation (NSTEMI) myocardial infarction: Secondary | ICD-10-CM | POA: Diagnosis not present

## 2023-07-22 DIAGNOSIS — J9811 Atelectasis: Secondary | ICD-10-CM | POA: Diagnosis not present

## 2023-07-22 DIAGNOSIS — I509 Heart failure, unspecified: Secondary | ICD-10-CM | POA: Diagnosis not present

## 2023-07-22 DIAGNOSIS — D649 Anemia, unspecified: Secondary | ICD-10-CM | POA: Diagnosis not present

## 2023-07-23 DIAGNOSIS — D649 Anemia, unspecified: Secondary | ICD-10-CM | POA: Diagnosis not present

## 2023-07-23 DIAGNOSIS — N185 Chronic kidney disease, stage 5: Secondary | ICD-10-CM | POA: Diagnosis not present

## 2023-07-23 DIAGNOSIS — N179 Acute kidney failure, unspecified: Secondary | ICD-10-CM | POA: Diagnosis not present

## 2023-07-23 DIAGNOSIS — I214 Non-ST elevation (NSTEMI) myocardial infarction: Secondary | ICD-10-CM | POA: Diagnosis not present

## 2023-07-23 DIAGNOSIS — J90 Pleural effusion, not elsewhere classified: Secondary | ICD-10-CM | POA: Diagnosis not present

## 2023-07-23 DIAGNOSIS — R918 Other nonspecific abnormal finding of lung field: Secondary | ICD-10-CM | POA: Diagnosis not present

## 2023-07-24 DIAGNOSIS — D649 Anemia, unspecified: Secondary | ICD-10-CM | POA: Diagnosis not present

## 2023-07-24 DIAGNOSIS — I214 Non-ST elevation (NSTEMI) myocardial infarction: Secondary | ICD-10-CM | POA: Diagnosis not present

## 2023-07-24 DIAGNOSIS — N179 Acute kidney failure, unspecified: Secondary | ICD-10-CM | POA: Diagnosis not present

## 2023-07-24 DIAGNOSIS — I509 Heart failure, unspecified: Secondary | ICD-10-CM | POA: Diagnosis not present

## 2023-07-25 DIAGNOSIS — R0602 Shortness of breath: Secondary | ICD-10-CM | POA: Diagnosis not present

## 2023-07-25 DIAGNOSIS — R079 Chest pain, unspecified: Secondary | ICD-10-CM | POA: Diagnosis not present

## 2023-07-25 DIAGNOSIS — D649 Anemia, unspecified: Secondary | ICD-10-CM | POA: Diagnosis not present

## 2023-07-25 DIAGNOSIS — I214 Non-ST elevation (NSTEMI) myocardial infarction: Secondary | ICD-10-CM | POA: Diagnosis not present

## 2023-07-25 DIAGNOSIS — N185 Chronic kidney disease, stage 5: Secondary | ICD-10-CM | POA: Diagnosis not present

## 2023-07-25 DIAGNOSIS — R918 Other nonspecific abnormal finding of lung field: Secondary | ICD-10-CM | POA: Diagnosis not present

## 2023-07-25 DIAGNOSIS — N179 Acute kidney failure, unspecified: Secondary | ICD-10-CM | POA: Diagnosis not present

## 2023-07-26 DIAGNOSIS — R0989 Other specified symptoms and signs involving the circulatory and respiratory systems: Secondary | ICD-10-CM | POA: Diagnosis not present

## 2023-07-26 DIAGNOSIS — I509 Heart failure, unspecified: Secondary | ICD-10-CM | POA: Diagnosis not present

## 2023-07-26 DIAGNOSIS — I214 Non-ST elevation (NSTEMI) myocardial infarction: Secondary | ICD-10-CM | POA: Diagnosis not present

## 2023-07-26 DIAGNOSIS — R0602 Shortness of breath: Secondary | ICD-10-CM | POA: Diagnosis not present

## 2023-07-26 DIAGNOSIS — N179 Acute kidney failure, unspecified: Secondary | ICD-10-CM | POA: Diagnosis not present

## 2023-07-26 DIAGNOSIS — D649 Anemia, unspecified: Secondary | ICD-10-CM | POA: Diagnosis not present

## 2023-07-27 DIAGNOSIS — N186 End stage renal disease: Secondary | ICD-10-CM | POA: Diagnosis not present

## 2023-07-27 DIAGNOSIS — I214 Non-ST elevation (NSTEMI) myocardial infarction: Secondary | ICD-10-CM | POA: Diagnosis not present

## 2023-07-27 DIAGNOSIS — D649 Anemia, unspecified: Secondary | ICD-10-CM | POA: Diagnosis not present

## 2023-07-27 DIAGNOSIS — Z992 Dependence on renal dialysis: Secondary | ICD-10-CM | POA: Diagnosis not present

## 2023-07-27 DIAGNOSIS — N179 Acute kidney failure, unspecified: Secondary | ICD-10-CM | POA: Diagnosis not present

## 2023-07-27 DIAGNOSIS — N185 Chronic kidney disease, stage 5: Secondary | ICD-10-CM | POA: Diagnosis not present

## 2023-07-28 DIAGNOSIS — N186 End stage renal disease: Secondary | ICD-10-CM | POA: Diagnosis not present

## 2023-07-28 DIAGNOSIS — Z992 Dependence on renal dialysis: Secondary | ICD-10-CM | POA: Diagnosis not present

## 2023-07-28 DIAGNOSIS — I214 Non-ST elevation (NSTEMI) myocardial infarction: Secondary | ICD-10-CM | POA: Diagnosis not present

## 2023-07-28 DIAGNOSIS — E1142 Type 2 diabetes mellitus with diabetic polyneuropathy: Secondary | ICD-10-CM | POA: Diagnosis not present

## 2023-07-28 DIAGNOSIS — N179 Acute kidney failure, unspecified: Secondary | ICD-10-CM | POA: Diagnosis not present

## 2023-07-28 DIAGNOSIS — D649 Anemia, unspecified: Secondary | ICD-10-CM | POA: Diagnosis not present

## 2023-07-28 DIAGNOSIS — Z794 Long term (current) use of insulin: Secondary | ICD-10-CM | POA: Diagnosis not present

## 2023-07-28 DIAGNOSIS — I509 Heart failure, unspecified: Secondary | ICD-10-CM | POA: Diagnosis not present

## 2023-07-28 DIAGNOSIS — E1165 Type 2 diabetes mellitus with hyperglycemia: Secondary | ICD-10-CM | POA: Diagnosis not present

## 2023-07-29 DIAGNOSIS — I209 Angina pectoris, unspecified: Secondary | ICD-10-CM | POA: Diagnosis not present

## 2023-07-29 DIAGNOSIS — I214 Non-ST elevation (NSTEMI) myocardial infarction: Secondary | ICD-10-CM | POA: Diagnosis not present

## 2023-07-29 DIAGNOSIS — I5030 Unspecified diastolic (congestive) heart failure: Secondary | ICD-10-CM | POA: Diagnosis not present

## 2023-07-29 DIAGNOSIS — N186 End stage renal disease: Secondary | ICD-10-CM | POA: Diagnosis not present

## 2023-07-29 DIAGNOSIS — D649 Anemia, unspecified: Secondary | ICD-10-CM | POA: Diagnosis not present

## 2023-07-29 DIAGNOSIS — E1165 Type 2 diabetes mellitus with hyperglycemia: Secondary | ICD-10-CM | POA: Diagnosis not present

## 2023-07-29 DIAGNOSIS — N179 Acute kidney failure, unspecified: Secondary | ICD-10-CM | POA: Diagnosis not present

## 2023-07-29 DIAGNOSIS — I509 Heart failure, unspecified: Secondary | ICD-10-CM | POA: Diagnosis not present

## 2023-07-29 DIAGNOSIS — Z992 Dependence on renal dialysis: Secondary | ICD-10-CM | POA: Diagnosis not present

## 2023-07-29 DIAGNOSIS — E785 Hyperlipidemia, unspecified: Secondary | ICD-10-CM | POA: Diagnosis not present

## 2023-07-29 DIAGNOSIS — I10 Essential (primary) hypertension: Secondary | ICD-10-CM | POA: Diagnosis not present

## 2023-07-29 DIAGNOSIS — E1142 Type 2 diabetes mellitus with diabetic polyneuropathy: Secondary | ICD-10-CM | POA: Diagnosis not present

## 2023-07-30 DIAGNOSIS — Z992 Dependence on renal dialysis: Secondary | ICD-10-CM | POA: Diagnosis not present

## 2023-07-30 DIAGNOSIS — I214 Non-ST elevation (NSTEMI) myocardial infarction: Secondary | ICD-10-CM | POA: Diagnosis not present

## 2023-07-30 DIAGNOSIS — E785 Hyperlipidemia, unspecified: Secondary | ICD-10-CM | POA: Diagnosis not present

## 2023-07-30 DIAGNOSIS — N179 Acute kidney failure, unspecified: Secondary | ICD-10-CM | POA: Diagnosis not present

## 2023-07-30 DIAGNOSIS — N185 Chronic kidney disease, stage 5: Secondary | ICD-10-CM | POA: Diagnosis not present

## 2023-07-30 DIAGNOSIS — I509 Heart failure, unspecified: Secondary | ICD-10-CM | POA: Diagnosis not present

## 2023-07-30 DIAGNOSIS — I10 Essential (primary) hypertension: Secondary | ICD-10-CM | POA: Diagnosis not present

## 2023-07-30 DIAGNOSIS — I5030 Unspecified diastolic (congestive) heart failure: Secondary | ICD-10-CM | POA: Diagnosis not present

## 2023-07-30 DIAGNOSIS — N186 End stage renal disease: Secondary | ICD-10-CM | POA: Diagnosis not present

## 2023-07-30 DIAGNOSIS — D649 Anemia, unspecified: Secondary | ICD-10-CM | POA: Diagnosis not present

## 2023-07-30 DIAGNOSIS — I209 Angina pectoris, unspecified: Secondary | ICD-10-CM | POA: Diagnosis not present

## 2023-07-30 DIAGNOSIS — E1142 Type 2 diabetes mellitus with diabetic polyneuropathy: Secondary | ICD-10-CM | POA: Diagnosis not present

## 2023-07-30 DIAGNOSIS — E1165 Type 2 diabetes mellitus with hyperglycemia: Secondary | ICD-10-CM | POA: Diagnosis not present

## 2023-07-31 DIAGNOSIS — N186 End stage renal disease: Secondary | ICD-10-CM | POA: Diagnosis not present

## 2023-07-31 DIAGNOSIS — Z794 Long term (current) use of insulin: Secondary | ICD-10-CM | POA: Diagnosis not present

## 2023-07-31 DIAGNOSIS — N179 Acute kidney failure, unspecified: Secondary | ICD-10-CM | POA: Diagnosis not present

## 2023-07-31 DIAGNOSIS — E1165 Type 2 diabetes mellitus with hyperglycemia: Secondary | ICD-10-CM | POA: Diagnosis not present

## 2023-07-31 DIAGNOSIS — I509 Heart failure, unspecified: Secondary | ICD-10-CM | POA: Diagnosis not present

## 2023-07-31 DIAGNOSIS — D649 Anemia, unspecified: Secondary | ICD-10-CM | POA: Diagnosis not present

## 2023-07-31 DIAGNOSIS — I214 Non-ST elevation (NSTEMI) myocardial infarction: Secondary | ICD-10-CM | POA: Diagnosis not present

## 2023-07-31 DIAGNOSIS — E1142 Type 2 diabetes mellitus with diabetic polyneuropathy: Secondary | ICD-10-CM | POA: Diagnosis not present

## 2023-07-31 DIAGNOSIS — Z992 Dependence on renal dialysis: Secondary | ICD-10-CM | POA: Diagnosis not present

## 2023-08-01 DIAGNOSIS — D649 Anemia, unspecified: Secondary | ICD-10-CM | POA: Diagnosis not present

## 2023-08-01 DIAGNOSIS — I10 Essential (primary) hypertension: Secondary | ICD-10-CM | POA: Diagnosis not present

## 2023-08-01 DIAGNOSIS — N179 Acute kidney failure, unspecified: Secondary | ICD-10-CM | POA: Diagnosis not present

## 2023-08-01 DIAGNOSIS — N185 Chronic kidney disease, stage 5: Secondary | ICD-10-CM | POA: Diagnosis not present

## 2023-08-01 DIAGNOSIS — E785 Hyperlipidemia, unspecified: Secondary | ICD-10-CM | POA: Diagnosis not present

## 2023-08-01 DIAGNOSIS — I209 Angina pectoris, unspecified: Secondary | ICD-10-CM | POA: Diagnosis not present

## 2023-08-01 DIAGNOSIS — I5030 Unspecified diastolic (congestive) heart failure: Secondary | ICD-10-CM | POA: Diagnosis not present

## 2023-08-03 ENCOUNTER — Telehealth: Payer: Self-pay | Admitting: *Deleted

## 2023-08-03 NOTE — Transitions of Care (Post Inpatient/ED Visit) (Signed)
 08/03/2023  Name: Anthony French MRN: [id number] DOB: 1955/12/02  Today's TOC FU Call Status: Today's TOC FU Call Status:: Successful TOC FU Call Completed TOC FU Call Complete Date: 08/03/23 Patient's Name and Date of Birth confirmed.  Transition Care Management Follow-up Telephone Call Date of Discharge: 08/01/23 Discharge Facility: Other (Non-[facility] Facility) Name of Other ([facility]) Discharge Facility: [facility] Type of Discharge: Inpatient Admission Primary Inpatient Discharge Diagnosis:: NSTEMI,  CABG x 3 How have you been since you were released from the hospital?:  (appetite good, no issues with bowel/ bladder, ambulating without difficulty, reports dialysis started in the hospital and will continue Tues, Th, Sat.) Any questions or concerns?: No  Items Reviewed: Did you receive and understand the discharge instructions provided?: Yes Medications obtained,verified, and reconciled?: No Medications Not Reviewed Reasons:: Other: (pt states he cannot read and there is no one at his house that can assist today with medication reconcilliation) Any new allergies since your discharge?: No Dietary orders reviewed?: Yes Type of Diet Ordered:: low sodium   heart healthy Do you have support at home?: Yes People in Home [RPT]:  (roommate) Name of Support/Comfort Primary Source: Selena  (roommate)  Medications Reviewed Today: Medications Reviewed Today   Medications were not reviewed in this encounter     Home Care and Equipment/Supplies: Were Home Health Services Ordered?: No Any new equipment or medical supplies ordered?: No  Functional Questionnaire: Do you need assistance with bathing/showering or dressing?: No Do you need assistance with meal preparation?: No Do you need assistance with eating?: No Do you have difficulty maintaining continence: No Do you need assistance with getting out of bed/getting out of a chair/moving?: No Do you have difficulty managing or taking your  medications?: No (pt states he is able to take his medications daily on his own, but cannot read and requires assistance with making sure he has the correct medications, etc.   Family assists)  Follow up appointments reviewed: PCP Follow-up appointment confirmed?: Yes (collaborated with care guide and scheduled post hospital follow up appointment) Date of PCP follow-up appointment?: 08/12/23 Follow-up Provider: Nena Hummer NP  @ 1215 pm Specialist Hospital Follow-up appointment confirmed?: No (pt states his daughter is calling today to schedule specialist appointments with surgeon and cardiologist) Reason Specialist Follow-Up Not Confirmed: Patient has Specialist Provider Number and will Call for Appointment Do you need transportation to your follow-up appointment?: No Do you understand care options if your condition(s) worsen?: Yes-patient verbalized understanding  SDOH Interventions Today    Flowsheet Row Most Recent Value  SDOH Interventions   Food Insecurity Interventions Intervention Not Indicated  Housing Interventions Intervention Not Indicated  Transportation Interventions Intervention Not Indicated  Utilities Interventions Intervention Not Indicated    Goals Addressed             This Visit's Progress    VBCI Transitions of Care (TOC) Care Plan       Problems:  Recent Hospitalization for treatment of NSTEMI/ s/p CABG x 3 No Hospital Follow Up Provider appointment collaborated with care guide, appointment scheduled for 08/12/23 @ 1215 pm , No Specialist appointment daughter calling today to schedule appointment, and pt unable to read RN Care Manager unable to complete medication reconciliation due to patient does not have anyone there at home to assist with reading labels on bottles, patient states hopefully next week will be able to review medications Patient received dialysis at hospital and will now follow up with ongoing dialysis 3 x week Tues, Thurs, Sat  Goal:  Over  the next 30 days, the patient will not experience hospital readmission  Interventions:   CAD Interventions: Assessed understanding of CAD diagnosis Medications reviewed including medications utilized in CAD treatment plan Provided education on importance of blood pressure control in management of CAD Counseled on importance of regular laboratory monitoring as prescribed Reviewed Importance of taking all medications as prescribed Reviewed Importance of attending all scheduled provider appointments Screening for signs and symptoms of depression related to chronic disease state Assessed social determinant of health barriers Reviewed sternal precautions Reviewed signs/ symptoms infection Reviewed signs/ symptoms MI and actions to take Reviewed upcoming scheduled appointments and dialysis schedule  Patient Self Care Activities:  Attend all scheduled provider appointments Attend church or other social activities Call pharmacy for medication refills 3-7 days in advance of running out of medications Call provider office for new concerns or questions  Notify RN Care Manager of TOC call rescheduling needs Participate in Transition of Care Program/Attend TOC scheduled calls Take medications as prescribed   Follow up with primary care provider on 08/12/23 @ 1215 pm  Plan:  Telephone follow up appointment with care management team member scheduled for:  08/10/23 @ 1015 am The patient has been provided with contact information for the care management team and has been advised to call with any health related questions or concerns.         Mliss Creed [facility], BSN RN Care Manager/ Transition of Care [place]/ [facility] [phone]

## 2023-08-04 DIAGNOSIS — D631 Anemia in chronic kidney disease: Secondary | ICD-10-CM | POA: Diagnosis not present

## 2023-08-04 DIAGNOSIS — N186 End stage renal disease: Secondary | ICD-10-CM | POA: Diagnosis not present

## 2023-08-04 DIAGNOSIS — N2581 Secondary hyperparathyroidism of renal origin: Secondary | ICD-10-CM | POA: Diagnosis not present

## 2023-08-06 DIAGNOSIS — D631 Anemia in chronic kidney disease: Secondary | ICD-10-CM | POA: Diagnosis not present

## 2023-08-06 DIAGNOSIS — N2581 Secondary hyperparathyroidism of renal origin: Secondary | ICD-10-CM | POA: Diagnosis not present

## 2023-08-06 DIAGNOSIS — N186 End stage renal disease: Secondary | ICD-10-CM | POA: Diagnosis not present

## 2023-08-08 DIAGNOSIS — N2581 Secondary hyperparathyroidism of renal origin: Secondary | ICD-10-CM | POA: Diagnosis not present

## 2023-08-08 DIAGNOSIS — N186 End stage renal disease: Secondary | ICD-10-CM | POA: Diagnosis not present

## 2023-08-08 DIAGNOSIS — D631 Anemia in chronic kidney disease: Secondary | ICD-10-CM | POA: Diagnosis not present

## 2023-08-10 ENCOUNTER — Other Ambulatory Visit: Payer: Self-pay | Admitting: *Deleted

## 2023-08-10 DIAGNOSIS — N186 End stage renal disease: Secondary | ICD-10-CM | POA: Diagnosis not present

## 2023-08-10 DIAGNOSIS — Z992 Dependence on renal dialysis: Secondary | ICD-10-CM | POA: Diagnosis not present

## 2023-08-10 NOTE — Patient Outreach (Signed)
 Transition of Care week 2  Visit Note  08/10/2023  Name: Anthony French MRN: [id number]          DOB: [birth year]  Situation: Patient enrolled in [facility] 30-day program. Visit completed with patient by telephone.   Background:    Past Medical History:  Diagnosis Date   Diabetes (HCC)    Diabetic neuropathy (HCC)    Essential hypertension, benign 06/01/2019   Hyperlipidemia     Assessment: Patient Reported Symptoms: Cognitive Cognitive Status: No symptoms reported, Able to follow simple commands, Normal speech and language skills      Neurological Neurological Review of Symptoms: No symptoms reported    HEENT HEENT Symptoms Reported: No symptoms reported      Cardiovascular Cardiovascular Symptoms Reported: No symptoms reported    Respiratory Respiratory Symptoms Reported: No symptoms reported    Endocrine Endocrine Symptoms Reported: No symptoms reported Is patient diabetic?: Yes Is patient checking blood sugars at home?: Yes List most recent blood sugar readings, include date and time of day: FBS today 134 Endocrine Self-Management Outcome: 4 (good)  Gastrointestinal Gastrointestinal Symptoms Reported: No symptoms reported      Genitourinary Genitourinary Symptoms Reported: No symptoms reported Genitourinary Management Strategies: Hemodialysis Genitourinary Self-Management Outcome: 3 (uncertain) Genitourinary Comment: pt continues dialysis Tues, Thurs, Sat  Integumentary Integumentary Symptoms Reported: No symptoms reported    Musculoskeletal Musculoskelatal Symptoms Reviewed: No symptoms reported        Psychosocial Psychosocial Symptoms Reported: No symptoms reported         There were no vitals filed for this visit.  Medications Reviewed Today   Medications were not reviewed in this encounter     Goals Addressed             This Visit's Progress    VBCI Transitions of Care (TOC) Care Plan       Problems:  Recent Hospitalization for treatment of  NSTEMI/ s/p CABG x 3 No Hospital Follow Up Provider appointment collaborated with care guide, appointment scheduled for 08/12/23 @ 1215 pm , No Specialist appointment daughter calling today to schedule appointment, and pt unable to read RN Care Manager unable to complete medication reconciliation due to patient does not have anyone there at home to assist with reading labels on bottles, patient states hopefully next week will be able to review medications Patient received dialysis at hospital and will now follow up with ongoing dialysis 3 x week [name], Sat 08/10/23- spoke with pt who reports he is doing good  no new concerns reported, unable to complete medication reconciliation, pt states his roommate cannot read very well either and will not be able to reconcile medications today, pt states his daughter is a Scientist, clinical (histocompatibility and immunogenetics) and comes by his home once per weekly and prefills medication box, patient's daughter works and per pt not available to talk, pt states  he has all medications and his daughter provides good oversight, pt states he will take all medications to his appointment this week with primary care provider  Goal:  Over the next 30 days, the patient will not experience hospital readmission  Interventions:   CAD Interventions: Assessed understanding of CAD diagnosis Medications reviewed including medications utilized in CAD treatment plan Provided education on importance of blood pressure control in management of CAD Counseled on importance of regular laboratory monitoring as prescribed Reviewed Importance of taking all medications as prescribed Reviewed Importance of attending all scheduled provider appointments Reinforced sternal precautions Reinforced signs/ symptoms infection Reinforced signs/ symptoms MI and  actions to take Reviewed upcoming scheduled appointments and dialysis schedule  Patient Self Care Activities:  Attend all scheduled provider appointments Attend church or  other social activities Call pharmacy for medication refills 3-7 days in advance of running out of medications Call provider office for new concerns or questions  Notify RN Care Manager of TOC call rescheduling needs Participate in Transition of Care Program/Attend TOC scheduled calls Take medications as prescribed   Follow up with primary care provider on 08/12/23 @ 1215 pm- take all medications with you  Plan:  Telephone follow up appointment with care management team member scheduled for:  08/17/23 @ 1015 am The patient has been provided with contact information for the care management team and has been advised to call with any health related questions or concerns.          Recommendation:   PCP Follow-up Continue Current Plan of Care  Follow Up Plan:   Telephone follow-up 08/17/23 @ 1015 am  Mliss Creed [facility], BSN RN Care Manager/ Transition of Care [place]/ [facility] [phone]

## 2023-08-10 NOTE — Patient Instructions (Signed)
 Visit Information  Thank you for taking time to visit with me today. Please don't hesitate to contact me if I can be of assistance to you before our next scheduled telephone appointment.  Our next appointment is by telephone on 08/17/23 @ 1015 am  Following is a copy of your care plan:   Goals Addressed             This Visit's Progress    VBCI Transitions of Care (TOC) Care Plan       Problems:  Recent Hospitalization for treatment of NSTEMI/ s/p CABG x 3 No Hospital Follow Up Provider appointment collaborated with care guide, appointment scheduled for 08/12/23 @ 1215 pm , No Specialist appointment daughter calling today to schedule appointment, and pt unable to read RN Care Manager unable to complete medication reconciliation due to patient does not have anyone there at home to assist with reading labels on bottles, patient states hopefully next week will be able to review medications Patient received dialysis at hospital and will now follow up with ongoing dialysis 3 x week [name], Sat 08/10/23- spoke with pt who reports he is doing good  no new concerns reported, unable to complete medication reconciliation, pt states his roommate cannot read very well either and will not be able to reconcile medications today, pt states his daughter is a Scientist, clinical (histocompatibility and immunogenetics) and comes by his home once per weekly and prefills medication box, patient's daughter works and per pt not available to talk, pt states  he has all medications and his daughter provides good oversight, pt states he will take all medications to his appointment this week with primary care provider  Goal:  Over the next 30 days, the patient will not experience hospital readmission  Interventions:   CAD Interventions: Assessed understanding of CAD diagnosis Medications reviewed including medications utilized in CAD treatment plan Provided education on importance of blood pressure control in management of CAD Counseled on importance of  regular laboratory monitoring as prescribed Reviewed Importance of taking all medications as prescribed Reviewed Importance of attending all scheduled provider appointments Reinforced sternal precautions Reinforced signs/ symptoms infection Reinforced signs/ symptoms MI and actions to take Reviewed upcoming scheduled appointments and dialysis schedule  Patient Self Care Activities:  Attend all scheduled provider appointments Attend church or other social activities Call pharmacy for medication refills 3-7 days in advance of running out of medications Call provider office for new concerns or questions  Notify RN Care Manager of TOC call rescheduling needs Participate in Transition of Care Program/Attend TOC scheduled calls Take medications as prescribed   Follow up with primary care provider on 08/12/23 @ 1215 pm- take all medications with you  Plan:  Telephone follow up appointment with care management team member scheduled for:  08/17/23 @ 1015 am The patient has been provided with contact information for the care management team and has been advised to call with any health related questions or concerns.         The patient verbalized understanding of instructions, educational materials, and care plan provided today and DECLINED offer to receive copy of patient instructions, educational materials, and care plan.   Telephone follow up appointment with care management team member scheduled for: 08/17/23 @ 1015 am  Please call the care guide team at [phone] if you need to cancel or reschedule your appointment.   Please call the Suicide and Crisis Lifeline: 988 call the [place]  National Suicide Prevention Lifeline: 801-667-2141 or TTY: (727) 037-1041 TTY 254-704-4158) to talk  to a trained counselor call 1-800-273-TALK (toll free, 24 hour hotline) go to [facility] Urgent [facility] [address], [place] [phone]) call 911 if you are experiencing a Mental  Health or Behavioral Health Crisis or need someone to talk to.  Mliss Creed [facility], BSN RN Care Manager/ Transition of Care [place]/ [facility] [phone]

## 2023-08-11 DIAGNOSIS — N2581 Secondary hyperparathyroidism of renal origin: Secondary | ICD-10-CM | POA: Diagnosis not present

## 2023-08-11 DIAGNOSIS — D631 Anemia in chronic kidney disease: Secondary | ICD-10-CM | POA: Diagnosis not present

## 2023-08-11 DIAGNOSIS — D509 Iron deficiency anemia, unspecified: Secondary | ICD-10-CM | POA: Diagnosis not present

## 2023-08-11 DIAGNOSIS — E8779 Other fluid overload: Secondary | ICD-10-CM | POA: Diagnosis not present

## 2023-08-11 DIAGNOSIS — N186 End stage renal disease: Secondary | ICD-10-CM | POA: Diagnosis not present

## 2023-08-12 ENCOUNTER — Telehealth: Payer: Self-pay | Admitting: Family Medicine

## 2023-08-12 ENCOUNTER — Inpatient Hospital Stay: Admitting: Nurse Practitioner

## 2023-08-12 DIAGNOSIS — E113313 Type 2 diabetes mellitus with moderate nonproliferative diabetic retinopathy with macular edema, bilateral: Secondary | ICD-10-CM | POA: Diagnosis not present

## 2023-08-12 NOTE — Telephone Encounter (Signed)
 Pt called stating that he would need to r/s his HFU appt that is scheduled with his PCP on 08/19/23 because he has another appt scheduled for the same day, to see a specialist. I explained to patient that we have to see him within a 2 week period of the date he was discharged from the hospital and his PCP does not have anymore openings so the only other options would be for him to see if he can move his appt with the specialist and if not, we would have to r/s his appt with us  with another provider and he wouldn't be able to see his PCP. Patient said he would call the specialist office and see if he can r/s with them first and if not, he will call back to move his appt with us .

## 2023-08-13 DIAGNOSIS — E8779 Other fluid overload: Secondary | ICD-10-CM | POA: Diagnosis not present

## 2023-08-13 DIAGNOSIS — N186 End stage renal disease: Secondary | ICD-10-CM | POA: Diagnosis not present

## 2023-08-13 DIAGNOSIS — D631 Anemia in chronic kidney disease: Secondary | ICD-10-CM | POA: Diagnosis not present

## 2023-08-13 DIAGNOSIS — N2581 Secondary hyperparathyroidism of renal origin: Secondary | ICD-10-CM | POA: Diagnosis not present

## 2023-08-13 DIAGNOSIS — D509 Iron deficiency anemia, unspecified: Secondary | ICD-10-CM | POA: Diagnosis not present

## 2023-08-15 DIAGNOSIS — N186 End stage renal disease: Secondary | ICD-10-CM | POA: Diagnosis not present

## 2023-08-15 DIAGNOSIS — D631 Anemia in chronic kidney disease: Secondary | ICD-10-CM | POA: Diagnosis not present

## 2023-08-15 DIAGNOSIS — N2581 Secondary hyperparathyroidism of renal origin: Secondary | ICD-10-CM | POA: Diagnosis not present

## 2023-08-15 DIAGNOSIS — E8779 Other fluid overload: Secondary | ICD-10-CM | POA: Diagnosis not present

## 2023-08-15 DIAGNOSIS — D509 Iron deficiency anemia, unspecified: Secondary | ICD-10-CM | POA: Diagnosis not present

## 2023-08-17 ENCOUNTER — Encounter: Payer: Self-pay | Admitting: *Deleted

## 2023-08-17 ENCOUNTER — Telehealth: Payer: Self-pay | Admitting: *Deleted

## 2023-08-17 DIAGNOSIS — I214 Non-ST elevation (NSTEMI) myocardial infarction: Secondary | ICD-10-CM | POA: Diagnosis not present

## 2023-08-18 ENCOUNTER — Telehealth: Payer: Self-pay | Admitting: *Deleted

## 2023-08-18 ENCOUNTER — Other Ambulatory Visit: Payer: Self-pay | Admitting: Family Medicine

## 2023-08-18 DIAGNOSIS — E8779 Other fluid overload: Secondary | ICD-10-CM | POA: Diagnosis not present

## 2023-08-18 DIAGNOSIS — N2581 Secondary hyperparathyroidism of renal origin: Secondary | ICD-10-CM | POA: Diagnosis not present

## 2023-08-18 DIAGNOSIS — N184 Chronic kidney disease, stage 4 (severe): Secondary | ICD-10-CM

## 2023-08-18 DIAGNOSIS — D631 Anemia in chronic kidney disease: Secondary | ICD-10-CM | POA: Diagnosis not present

## 2023-08-18 DIAGNOSIS — N186 End stage renal disease: Secondary | ICD-10-CM | POA: Diagnosis not present

## 2023-08-18 DIAGNOSIS — D509 Iron deficiency anemia, unspecified: Secondary | ICD-10-CM | POA: Diagnosis not present

## 2023-08-18 NOTE — Patient Instructions (Signed)
 Visit Information  Thank you for taking time to visit with me today. Please don't hesitate to contact me if I can be of assistance to you before our next scheduled telephone appointment.  Following are the goals we discussed today:   Goals Addressed             This Visit's Progress    VBCI Transitions of Care (TOC) Care Plan       Problems:  Recent Hospitalization for treatment of NSTEMI/ s/p CABG x 3 No Hospital Follow Up Provider appointment collaborated with care guide, appointment scheduled for 08/12/23 @ 1215 pm , No Specialist appointment daughter calling today to schedule appointment, and pt unable to read RN Care Manager unable to complete medication reconciliation due to patient does not have anyone there at home to assist with reading labels on bottles, patient states hopefully next week will be able to review medications Patient received dialysis at hospital and will now follow up with ongoing dialysis 3 x week [name], Sat 08/10/23- spoke with pt who reports he is doing good  no new concerns reported, unable to complete medication reconciliation, pt states his roommate cannot read very well either and will not be able to reconcile medications today, pt states his daughter is a Scientist, clinical (histocompatibility and immunogenetics) and comes by his home once per weekly and prefills medication box, patient's daughter works and per pt not available to talk, pt states  he has all medications and his daughter provides good oversight, pt states he will take all medications to his appointment this week with primary care provider 08/18/23- spoke with pt who reports he is at dialysis, states going pretty good, Fasting CBG today 184, pt to see primary care provider 08/19/23, no new concerns reported  Goal:  Over the next 30 days, the patient will not experience hospital readmission  Interventions:   CAD Interventions: Assessed understanding of CAD diagnosis Medications reviewed including medications utilized in CAD treatment  plan Provided education on importance of blood pressure control in management of CAD Counseled on importance of regular laboratory monitoring as prescribed Reviewed Importance of taking all medications as prescribed Reviewed Importance of attending all scheduled provider appointments Reinforced sternal precautions Reviewed signs/ symptoms infection Reviewed signs/ symptoms MI and actions to take Reviewed upcoming scheduled appointments and dialysis schedule  Patient Self Care Activities:  Attend all scheduled provider appointments Attend church or other social activities Call pharmacy for medication refills 3-7 days in advance of running out of medications Call provider office for new concerns or questions  Notify RN Care Manager of TOC call rescheduling needs Participate in Transition of Care Program/Attend TOC scheduled calls Take medications as prescribed   Follow up with primary care provider on 08/19/23- take all medications with you  Plan:  Telephone follow up appointment with care management team member scheduled for:  08/26/23 @ 115 pm The patient has been provided with contact information for the care management team and has been advised to call with any health related questions or concerns.          Our next appointment is by telephone on 08/26/23 at 115 pm  Please call the care guide team at [phone] if you need to cancel or reschedule your appointment.   If you are experiencing a Mental Health or Behavioral Health Crisis or need someone to talk to, please call the Suicide and Crisis Lifeline: 988 call the [place]  National Suicide Prevention Lifeline: (727) 697-1299 or TTY: 331-483-6938 TTY (626) 096-5686) to talk to a  trained counselor call 1-800-273-TALK (toll free, 24 hour hotline) go to [facility] Urgent Care [address] [place] [phone]) call the [facility] Line: 209-107-5914 call 911   The patient verbalized  understanding of instructions, educational materials, and care plan provided today and DECLINED offer to receive copy of patient instructions, educational materials, and care plan.   Telephone follow up appointment with care management team member scheduled for: 08/26/23 @ 115 pm  Mliss Creed [facility], BSN RN Care Manager/ Transition of Care [place]/ [facility] [phone]

## 2023-08-18 NOTE — Patient Outreach (Signed)
  Transition of Care week 3  Visit Note  08/18/2023  Name: Anthony French MRN: [id number]          DOB: 31-May-1955  Situation: Patient enrolled in [facility] 30-day program. Visit completed with patient by telephone.   Background:     Past Medical History:  Diagnosis Date   Diabetes (HCC)    Diabetic neuropathy (HCC)    Essential hypertension, benign 06/01/2019   Hyperlipidemia     Assessment: Patient Reported Symptoms: Cognitive Cognitive Status: No symptoms reported, Able to follow simple commands, Alert and oriented to person, place, and time      Neurological Neurological Review of Symptoms: No symptoms reported    HEENT HEENT Symptoms Reported: No symptoms reported      Cardiovascular Cardiovascular Symptoms Reported: No symptoms reported    Respiratory Respiratory Symptoms Reported: No symptoms reported    Endocrine Endocrine Symptoms Reported: No symptoms reported Is patient diabetic?: Yes Is patient checking blood sugars at home?: Yes List most recent blood sugar readings, include date and time of day: FBS today 184 Endocrine Self-Management Outcome: 4 (good)  Gastrointestinal Gastrointestinal Symptoms Reported: No symptoms reported      Genitourinary Genitourinary Symptoms Reported: No symptoms reported Additional Genitourinary Details: ESRD Genitourinary Management Strategies: Hemodialysis Hemodialysis Schedule: Tues, Thurs, Sat Genitourinary Self-Management Outcome: 3 (uncertain)  Integumentary Integumentary Symptoms Reported: No symptoms reported    Musculoskeletal Musculoskelatal Symptoms Reviewed: No symptoms reported        Psychosocial    No symptoms reported       There were no vitals filed for this visit.  Medications Reviewed Today   Medications were not reviewed in this encounter   Unable to review medications  Recommendation:   PCP Follow-up  Follow Up Plan:   Telephone follow-up 08/26/23 @ 115 pm  Mliss Creed [facility], BSN RN Care Manager/  Transition of Care [place]/ [facility] [phone]

## 2023-08-19 ENCOUNTER — Ambulatory Visit

## 2023-08-19 ENCOUNTER — Encounter: Payer: Self-pay | Admitting: Family Medicine

## 2023-08-19 VITALS — BP 158/74 | HR 69 | Temp 98.4°F | Ht 69.0 in | Wt 182.2 lb

## 2023-08-19 DIAGNOSIS — I152 Hypertension secondary to endocrine disorders: Secondary | ICD-10-CM

## 2023-08-19 DIAGNOSIS — I480 Paroxysmal atrial fibrillation: Secondary | ICD-10-CM

## 2023-08-19 DIAGNOSIS — Z09 Encounter for follow-up examination after completed treatment for conditions other than malignant neoplasm: Secondary | ICD-10-CM

## 2023-08-19 DIAGNOSIS — E1159 Type 2 diabetes mellitus with other circulatory complications: Secondary | ICD-10-CM | POA: Diagnosis not present

## 2023-08-19 DIAGNOSIS — N186 End stage renal disease: Secondary | ICD-10-CM | POA: Diagnosis not present

## 2023-08-19 DIAGNOSIS — L89892 Pressure ulcer of other site, stage 2: Secondary | ICD-10-CM

## 2023-08-19 DIAGNOSIS — Z992 Dependence on renal dialysis: Secondary | ICD-10-CM | POA: Diagnosis not present

## 2023-08-19 DIAGNOSIS — Z951 Presence of aortocoronary bypass graft: Secondary | ICD-10-CM | POA: Diagnosis not present

## 2023-08-19 DIAGNOSIS — I252 Old myocardial infarction: Secondary | ICD-10-CM

## 2023-08-19 NOTE — Progress Notes (Signed)
 Subjective: CC: Hospital discharge follow-up for NSTEMI PCP: Anthony Norene HERO, DO YEP:Anthony French is a 68 y.o. male presenting to clinic today for:  1.  Hospital discharge follow-up for NSTEMI Patient was admitted to [facility] and found to have multivessel disease.  He had CABG x 3.  In the setting of anemia of chronic disease due to ESRD not yet on dialysis and postprocedure bleeding, patient required transfusion.  Hgb at discharge was 8.3.  Labs collected yesterday showed down trend to 7.8.    He later developed post op atrial fibrillation and was treated with amiodarone, which he was ultimately discharged with.  He cardioverted with medication and remained in normal sinus rhythm until discharge.  During hospitalization he became anuretic and subsequently was started on hemodialysis in hospital.  After 21-day hospital stay, patient was discharged.  Plans for hemodialysis in [place].  Continues to see Dr. Rachele for management of ESRD now on HD.  He saw cardiology outpatient on 08/17/2023 but detailed note is not available in EMR.  He has an appoint with cardiothoracic surgery today.  He is accompanied today by his daughter Anthony French.  He notes that he is having hemodialysis in [place] on Tuesdays, Thursdays and Saturdays.  So far things seem to be going well but he had some questions about his hydralazine.  He was told not to utilize that medicine prior to going and he was not sure how he should use it going forward.  He notes blood pressures at home have ranged anywhere between 150s and 180s systolic and 70s to 90s diastolic since discharge from the hospital.  He has been monitoring weight very closely and has remained around 180.1 pounds.  Not sure what his dry weight is but he will ask.  He is establishing with a new nephrologist since Dr. Rachele does not go out to [place] and he is not quite sure what that name is but he will get it to me ASAP.  He has some questions about some lesions on his toes.  He  notes that he had compression hose on during the hospitalization that were only removed twice.  The right foot has 2 spots and he is worried about them.  He has been utilizing some Neosporin.  Denies any drainage or fevers.   ROS: Per HPI  Allergies  Allergen Reactions   Other Hives    Pt states that he is allergic to an ABT but he can not remember what it is   Sulfa  Antibiotics Hives   Past Medical History:  Diagnosis Date   Diabetes (HCC)    Diabetic neuropathy (HCC)    Essential hypertension, benign 06/01/2019   Hyperlipidemia     Current Outpatient Medications:    amiodarone (PACERONE) 200 MG tablet, Take 200 mg by mouth daily., Disp: , Rfl:    aspirin EC 325 MG tablet, Take 325 mg by mouth daily., Disp: , Rfl:    atorvastatin  (LIPITOR) 80 MG tablet, Take 80 mg by mouth at bedtime., Disp: , Rfl:    blood glucose meter kit and supplies, Dispense based on patient and insurance preference. Use up to four times daily as directed. (FOR ICD-10 E10.9, E11.9). Check blood sugar twice a day as directed., Disp: 1 each, Rfl: 0   chlorthalidone (HYGROTON) 25 MG tablet, Take 12.5 mg by mouth daily., Disp: , Rfl:    Continuous Glucose Receiver (FREESTYLE LIBRE 3 READER) DEVI, 1 each by Does not apply route in the morning, at noon, in  the evening, and at bedtime. Use to check BGs E11.9, Disp: 1 each, Rfl: 0   Continuous Glucose Sensor (FREESTYLE LIBRE 3 SENSOR) MISC, PLACE 1 SENSOR ON THE SKIN EVERY 14 DAYS. USE TO CHECK GLUCOSE CONTINUOUSLY E11.9, Disp: 6 each, Rfl: 3   famotidine (PEPCID) 20 MG tablet, Take 20 mg by mouth daily., Disp: , Rfl:    glucose blood (ACCU-CHEK GUIDE) test strip, Test blood sugar 4 times per day, before meals and at bedtime., Disp: 500 strip, Rfl: 3   hydrALAZINE (APRESOLINE) 25 MG tablet, Take 75 mg by mouth in the morning and at bedtime., Disp: , Rfl:    insulin  NPH-regular Human (70-30) 100 UNIT/ML injection, Inject 15-20 Units into the skin See admin instructions.  Inject 20 units before breakfast and 15 units before supper  06/26/23 per pt inj 10 before breakfast and 5 units before supper per pt, Disp: , Rfl:    Insulin  Pen Needle (PEN NEEDLES) 31G X 8 MM MISC, 1 each by Does not apply route in the morning and at bedtime., Disp: 100 each, Rfl: 11   Insulin  Syringe-Needle U-100 (BD INSULIN  SYRINGE U/F) 31G X 5/16 0.3 ML MISC, USE WITH INSULIN  TWICE  DAILY DX E11.22, Disp: 200 each, Rfl: 3   levothyroxine  (SYNTHROID ) 50 MCG tablet, Take 1 tablet (50 mcg total) by mouth daily before breakfast., Disp: 100 tablet, Rfl: 0   metoprolol tartrate (LOPRESSOR) 25 MG tablet, Take 12.5 mg by mouth 2 (two) times daily., Disp: , Rfl:    Multiple Vitamin (MULTIVITAMIN WITH MINERALS) TABS tablet, Take 1 tablet by mouth daily., Disp: , Rfl:    oxyCODONE -acetaminophen  (PERCOCET) 5-325 MG tablet, Take 1 tablet by mouth every 6 (six) hours as needed for severe pain (pain score 7-10)., Disp: 10 tablet, Rfl: 0   rizatriptan  (MAXALT -MLT) 10 MG disintegrating tablet, TAKE 1 TABLET BY MOUTH AS NEEDED FOR MIGRAINE. MAY REPEAT IN 2 HOURS IF NEEDED, Disp: 10 tablet, Rfl: 2   sodium bicarbonate 650 MG tablet, Take 650 mg by mouth 3 (three) times daily., Disp: , Rfl:    sodium zirconium cyclosilicate (LOKELMA) 10 g PACK packet, Take 10 g by mouth daily., Disp: , Rfl:  Social History   Socioeconomic History   Marital status: Single    Spouse name: Not on file   Number of children: 3   Years of education: Not on file   Highest education level: Not on file  Occupational History   Occupation: retired  Tobacco Use   Smoking status: Never   Smokeless tobacco: Never  Vaping Use   Vaping status: Never Used  Substance and Sexual Activity   Alcohol use: Not Currently   Drug use: Never   Sexual activity: Not Currently  Other Topics Concern   Not on file  Social History Narrative   Not on file   Social Drivers of Health   Financial Resource Strain: Low Risk  (06/26/2023)   Overall  Financial Resource Strain (CARDIA)    Difficulty of Paying Living Expenses: Not hard at all  Food Insecurity: No Food Insecurity (08/03/2023)   Hunger Vital Sign    Worried About Running Out of Food in the Last Year: Never true    Ran Out of Food in the Last Year: Never true  Transportation Needs: No Transportation Needs (08/03/2023)   PRAPARE - Administrator, Civil Service (Medical): No    Lack of Transportation (Non-Medical): No  Physical Activity: Insufficiently Active (06/26/2023)   Exercise Vital Sign  Days of Exercise per Week: 3 days    Minutes of Exercise per Session: 10 min  Stress: No Stress Concern Present (07/10/2023)   Received from [facility] of Occupational Health - Occupational Stress Questionnaire    Feeling of Stress : Only a little  Social Connections: Moderately Integrated (06/26/2023)   Social Connection and Isolation Panel    Frequency of Communication with Friends and Family: Twice a week    Frequency of Social Gatherings with Friends and Family: Twice a week    Attends Religious Services: More than 4 times per year    Active Member of [employer] or Organizations: Yes    Attends Banker Meetings: More than 4 times per year    Marital Status: Widowed  Intimate Partner Violence: Not At Risk (08/03/2023)   Humiliation, Afraid, Rape, and Kick questionnaire    Fear of Current or Ex-Partner: No    Emotionally Abused: No    Physically Abused: No    Sexually Abused: No   Family History  Problem Relation Age of Onset   Stroke Mother    Diabetes Mother    Hypertension Mother    Hyperlipidemia Mother    Kidney disease Mother    COPD Father    Diabetes Sister    Stroke Brother    Diabetes Brother    Arthritis Maternal Grandmother     Objective: Office vital signs reviewed. BP (!) 158/74   Pulse 69   Temp 98.4 F (36.9 C) (Temporal)   Ht 5' 9 (1.753 m)   Wt 182 lb 3.2 oz (82.6 kg)   SpO2 96%   BMI 26.91 kg/m    Physical Examination:  General: Awake, alert, nontoxic elderly male, No acute distress HEENT: sclera white, MMM Cardio: regular rate and rhythm, S1S2 heard, no murmurs appreciated Pulm: clear to auscultation bilaterally, no wheezes, rhonchi or rales; normal work of breathing on room air Skin: Pressure sore, stage II noted on the medial aspect of the right great toe and on the lateral aspect of the right pinky toe.  No exudates.  No erythema or induration.  No fluctuance.  No involvement of joint.  Nontender to touch  Assessment/ Plan: 68 y.o. male   History of non-ST elevation myocardial infarction (NSTEMI)  Hospital discharge follow-up  Hypertension associated with diabetes (HCC)  ESRD (end stage renal disease) on dialysis (HCC)  Paroxysmal atrial fibrillation (HCC) - Plan: TSH + free T4  Pressure injury of toe of right foot, stage 2 (HCC)  Is on appropriate medications after NSTEMI.  Sees CT surgery for CABG follow-up today.  Discussed need for close following of thyroid  levels given use of amiodarone.  Future orders have been placed by anticipate his endocrinologist will likely collect this with his regular checkups  Blood pressure is not at goal, particular in the setting of ESRD on HD.  I did advise him to hold his hydralazine prior to hemodialysis but to resume it assuming that he has normal blood pressures at discharge from hemodialysis in the afternoons.  We discussed that he may need some titration of this medication going forward, particularly since he is not at goal for blood pressure but for now we will hold off on any adjustments and defer to nephrology since he is newly on hemodialysis  His toes show some pressure injury that likely from use of those TED hose.  They do not appear complicated.  I gave him some Xeroform bandages to aid in healing.  We discussed red flag signs and symptoms which would warrant further evaluation.  All questions were answered today and I  encouraged both he and his daughter to reach out to me should they need any additional assistance going forward.  Otherwise, we will plan to see him back in a couple of months for his regular checkup  Orders Placed This Encounter  Procedures   TSH + free T4    Standing Status:   Future    Expiration Date:   08/18/2024   No orders of the defined types were placed in this encounter.   Norene CHRISTELLA Fielding, DO [facility] [phone]

## 2023-08-20 DIAGNOSIS — D631 Anemia in chronic kidney disease: Secondary | ICD-10-CM | POA: Diagnosis not present

## 2023-08-20 DIAGNOSIS — E8779 Other fluid overload: Secondary | ICD-10-CM | POA: Diagnosis not present

## 2023-08-20 DIAGNOSIS — N2581 Secondary hyperparathyroidism of renal origin: Secondary | ICD-10-CM | POA: Diagnosis not present

## 2023-08-20 DIAGNOSIS — D509 Iron deficiency anemia, unspecified: Secondary | ICD-10-CM | POA: Diagnosis not present

## 2023-08-20 DIAGNOSIS — N186 End stage renal disease: Secondary | ICD-10-CM | POA: Diagnosis not present

## 2023-08-22 DIAGNOSIS — N186 End stage renal disease: Secondary | ICD-10-CM | POA: Diagnosis not present

## 2023-08-22 DIAGNOSIS — E8779 Other fluid overload: Secondary | ICD-10-CM | POA: Diagnosis not present

## 2023-08-22 DIAGNOSIS — D509 Iron deficiency anemia, unspecified: Secondary | ICD-10-CM | POA: Diagnosis not present

## 2023-08-22 DIAGNOSIS — D631 Anemia in chronic kidney disease: Secondary | ICD-10-CM | POA: Diagnosis not present

## 2023-08-22 DIAGNOSIS — N2581 Secondary hyperparathyroidism of renal origin: Secondary | ICD-10-CM | POA: Diagnosis not present

## 2023-08-25 DIAGNOSIS — D509 Iron deficiency anemia, unspecified: Secondary | ICD-10-CM | POA: Diagnosis not present

## 2023-08-25 DIAGNOSIS — N186 End stage renal disease: Secondary | ICD-10-CM | POA: Diagnosis not present

## 2023-08-25 DIAGNOSIS — D631 Anemia in chronic kidney disease: Secondary | ICD-10-CM | POA: Diagnosis not present

## 2023-08-25 DIAGNOSIS — N2581 Secondary hyperparathyroidism of renal origin: Secondary | ICD-10-CM | POA: Diagnosis not present

## 2023-08-25 DIAGNOSIS — E8779 Other fluid overload: Secondary | ICD-10-CM | POA: Diagnosis not present

## 2023-08-26 ENCOUNTER — Telehealth: Payer: Self-pay | Admitting: *Deleted

## 2023-08-26 ENCOUNTER — Encounter: Payer: Self-pay | Admitting: *Deleted

## 2023-08-27 ENCOUNTER — Telehealth: Payer: Self-pay | Admitting: *Deleted

## 2023-08-27 DIAGNOSIS — N2581 Secondary hyperparathyroidism of renal origin: Secondary | ICD-10-CM | POA: Diagnosis not present

## 2023-08-27 DIAGNOSIS — E8779 Other fluid overload: Secondary | ICD-10-CM | POA: Diagnosis not present

## 2023-08-27 DIAGNOSIS — D509 Iron deficiency anemia, unspecified: Secondary | ICD-10-CM | POA: Diagnosis not present

## 2023-08-27 DIAGNOSIS — D631 Anemia in chronic kidney disease: Secondary | ICD-10-CM | POA: Diagnosis not present

## 2023-08-27 DIAGNOSIS — N186 End stage renal disease: Secondary | ICD-10-CM | POA: Diagnosis not present

## 2023-08-27 NOTE — Patient Instructions (Signed)
 Visit Information  Thank you for taking time to visit with me today. Please don't hesitate to contact me if I can be of assistance to you before our next scheduled telephone appointment.  Following are the goals we discussed today:  (Copy and paste patient goals from clinical care plan here)  Our next appointment is no further scheduled appointments.   Please call the care guide team at [phone] if you need to cancel or reschedule your appointment.   If you are experiencing a Mental Health or Behavioral Health Crisis or need someone to talk to, please call the Suicide and Crisis Lifeline: 988 call the [place]  National Suicide Prevention Lifeline: 709-617-0301 or TTY: (630) 817-1255 TTY 606-245-6989) to talk to a trained counselor call 1-800-273-TALK (toll free, 24 hour hotline) go to [facility] [address] [place] [phone]) call 911   The patient verbalized understanding of instructions, educational materials, and care plan provided today and DECLINED offer to receive copy of patient instructions, educational materials, and care plan.   No further follow up required: case closure  Mliss Creed [facility], BSN RN Care Manager/ Transition of Care [place]/ [facility] [phone]

## 2023-08-27 NOTE — Patient Outreach (Signed)
  Transition of Care week 4  Visit Note  08/27/2023  Name: Anthony French MRN: [id number]          DOB: [birth year]  Situation: Patient enrolled in [facility] 30-day program. Visit completed with patient by telephone.   Background:    Past Medical History:  Diagnosis Date   Diabetes (HCC)    Diabetic neuropathy (HCC)    Essential hypertension, benign 06/01/2019   Hyperlipidemia     Assessment: Patient Reported Symptoms: Cognitive Cognitive Status: No symptoms reported, Able to follow simple commands, Alert and oriented to person, place, and time, Normal speech and language skills      Neurological Neurological Review of Symptoms: No symptoms reported    HEENT HEENT Symptoms Reported: No symptoms reported      Cardiovascular Cardiovascular Symptoms Reported: No symptoms reported Does patient have uncontrolled Hypertension?: No Weight: 182 lb (82.6 kg) Cardiovascular Self-Management Outcome: 4 (good) Cardiovascular Comment: pt saw cardiothoracic surgeon on 08/19/23, has been released, states  every thing looks good  Respiratory Respiratory Symptoms Reported: No symptoms reported    Endocrine Endocrine Symptoms Reported: No symptoms reported Is patient diabetic?: Yes Is patient checking blood sugars at home?: Yes List most recent blood sugar readings, include date and time of day: FBS today 139 Endocrine Self-Management Outcome: 4 (good) Endocrine Comment: AIC 5.8  Gastrointestinal Gastrointestinal Symptoms Reported: No symptoms reported      Genitourinary Genitourinary Symptoms Reported: No symptoms reported Additional Genitourinary Details: ESRD Genitourinary Management Strategies: Hemodialysis Hemodialysis Schedule: [name], Sat Hemodialysis Last Treatment: 08/25/23 Genitourinary Self-Management Outcome: 4 (good)  Integumentary Integumentary Symptoms Reported: No symptoms reported    Musculoskeletal Musculoskelatal Symptoms Reviewed: No symptoms reported         Psychosocial Psychosocial Symptoms Reported: No symptoms reported         There were no vitals filed for this visit.  Medications Reviewed Today   Medications were not reviewed in this encounter     Recommendation:   PCP Follow-up  Follow Up Plan:   Closing From:  Transitions of Care Program  Mliss Creed [facility], BSN RN Care Manager/ Transition of Care [place]/ [facility] [phone]

## 2023-08-29 DIAGNOSIS — N186 End stage renal disease: Secondary | ICD-10-CM | POA: Diagnosis not present

## 2023-08-29 DIAGNOSIS — N2581 Secondary hyperparathyroidism of renal origin: Secondary | ICD-10-CM | POA: Diagnosis not present

## 2023-08-29 DIAGNOSIS — D509 Iron deficiency anemia, unspecified: Secondary | ICD-10-CM | POA: Diagnosis not present

## 2023-08-29 DIAGNOSIS — E8779 Other fluid overload: Secondary | ICD-10-CM | POA: Diagnosis not present

## 2023-08-29 DIAGNOSIS — D631 Anemia in chronic kidney disease: Secondary | ICD-10-CM | POA: Diagnosis not present

## 2023-09-02 DIAGNOSIS — N2581 Secondary hyperparathyroidism of renal origin: Secondary | ICD-10-CM | POA: Diagnosis not present

## 2023-09-02 DIAGNOSIS — D509 Iron deficiency anemia, unspecified: Secondary | ICD-10-CM | POA: Diagnosis not present

## 2023-09-02 DIAGNOSIS — E8779 Other fluid overload: Secondary | ICD-10-CM | POA: Diagnosis not present

## 2023-09-02 DIAGNOSIS — D631 Anemia in chronic kidney disease: Secondary | ICD-10-CM | POA: Diagnosis not present

## 2023-09-02 DIAGNOSIS — N186 End stage renal disease: Secondary | ICD-10-CM | POA: Diagnosis not present

## 2023-09-03 DIAGNOSIS — E8779 Other fluid overload: Secondary | ICD-10-CM | POA: Diagnosis not present

## 2023-09-03 DIAGNOSIS — D631 Anemia in chronic kidney disease: Secondary | ICD-10-CM | POA: Diagnosis not present

## 2023-09-03 DIAGNOSIS — D509 Iron deficiency anemia, unspecified: Secondary | ICD-10-CM | POA: Diagnosis not present

## 2023-09-03 DIAGNOSIS — N186 End stage renal disease: Secondary | ICD-10-CM | POA: Diagnosis not present

## 2023-09-03 DIAGNOSIS — N2581 Secondary hyperparathyroidism of renal origin: Secondary | ICD-10-CM | POA: Diagnosis not present

## 2023-09-05 DIAGNOSIS — D631 Anemia in chronic kidney disease: Secondary | ICD-10-CM | POA: Diagnosis not present

## 2023-09-05 DIAGNOSIS — D509 Iron deficiency anemia, unspecified: Secondary | ICD-10-CM | POA: Diagnosis not present

## 2023-09-05 DIAGNOSIS — N186 End stage renal disease: Secondary | ICD-10-CM | POA: Diagnosis not present

## 2023-09-05 DIAGNOSIS — E8779 Other fluid overload: Secondary | ICD-10-CM | POA: Diagnosis not present

## 2023-09-05 DIAGNOSIS — N2581 Secondary hyperparathyroidism of renal origin: Secondary | ICD-10-CM | POA: Diagnosis not present

## 2023-09-08 DIAGNOSIS — N186 End stage renal disease: Secondary | ICD-10-CM | POA: Diagnosis not present

## 2023-09-08 DIAGNOSIS — N2581 Secondary hyperparathyroidism of renal origin: Secondary | ICD-10-CM | POA: Diagnosis not present

## 2023-09-08 DIAGNOSIS — E8779 Other fluid overload: Secondary | ICD-10-CM | POA: Diagnosis not present

## 2023-09-08 DIAGNOSIS — D509 Iron deficiency anemia, unspecified: Secondary | ICD-10-CM | POA: Diagnosis not present

## 2023-09-08 DIAGNOSIS — D631 Anemia in chronic kidney disease: Secondary | ICD-10-CM | POA: Diagnosis not present

## 2023-09-09 DIAGNOSIS — E113313 Type 2 diabetes mellitus with moderate nonproliferative diabetic retinopathy with macular edema, bilateral: Secondary | ICD-10-CM | POA: Diagnosis not present

## 2023-09-10 DIAGNOSIS — N186 End stage renal disease: Secondary | ICD-10-CM | POA: Diagnosis not present

## 2023-09-10 DIAGNOSIS — Z992 Dependence on renal dialysis: Secondary | ICD-10-CM | POA: Diagnosis not present

## 2023-09-10 DIAGNOSIS — E8779 Other fluid overload: Secondary | ICD-10-CM | POA: Diagnosis not present

## 2023-09-10 DIAGNOSIS — D509 Iron deficiency anemia, unspecified: Secondary | ICD-10-CM | POA: Diagnosis not present

## 2023-09-10 DIAGNOSIS — N2581 Secondary hyperparathyroidism of renal origin: Secondary | ICD-10-CM | POA: Diagnosis not present

## 2023-09-10 DIAGNOSIS — D631 Anemia in chronic kidney disease: Secondary | ICD-10-CM | POA: Diagnosis not present

## 2023-09-12 DIAGNOSIS — N2581 Secondary hyperparathyroidism of renal origin: Secondary | ICD-10-CM | POA: Diagnosis not present

## 2023-09-12 DIAGNOSIS — D509 Iron deficiency anemia, unspecified: Secondary | ICD-10-CM | POA: Diagnosis not present

## 2023-09-12 DIAGNOSIS — D631 Anemia in chronic kidney disease: Secondary | ICD-10-CM | POA: Diagnosis not present

## 2023-09-12 DIAGNOSIS — N186 End stage renal disease: Secondary | ICD-10-CM | POA: Diagnosis not present

## 2023-09-14 DIAGNOSIS — Z961 Presence of intraocular lens: Secondary | ICD-10-CM | POA: Diagnosis not present

## 2023-09-14 DIAGNOSIS — H25812 Combined forms of age-related cataract, left eye: Secondary | ICD-10-CM | POA: Diagnosis not present

## 2023-09-14 DIAGNOSIS — E113213 Type 2 diabetes mellitus with mild nonproliferative diabetic retinopathy with macular edema, bilateral: Secondary | ICD-10-CM | POA: Diagnosis not present

## 2023-09-14 DIAGNOSIS — H527 Unspecified disorder of refraction: Secondary | ICD-10-CM | POA: Diagnosis not present

## 2023-09-14 DIAGNOSIS — H43821 Vitreomacular adhesion, right eye: Secondary | ICD-10-CM | POA: Diagnosis not present

## 2023-09-15 DIAGNOSIS — D509 Iron deficiency anemia, unspecified: Secondary | ICD-10-CM | POA: Diagnosis not present

## 2023-09-15 DIAGNOSIS — D631 Anemia in chronic kidney disease: Secondary | ICD-10-CM | POA: Diagnosis not present

## 2023-09-15 DIAGNOSIS — N186 End stage renal disease: Secondary | ICD-10-CM | POA: Diagnosis not present

## 2023-09-15 DIAGNOSIS — N2581 Secondary hyperparathyroidism of renal origin: Secondary | ICD-10-CM | POA: Diagnosis not present

## 2023-09-17 DIAGNOSIS — N186 End stage renal disease: Secondary | ICD-10-CM | POA: Diagnosis not present

## 2023-09-17 DIAGNOSIS — D509 Iron deficiency anemia, unspecified: Secondary | ICD-10-CM | POA: Diagnosis not present

## 2023-09-17 DIAGNOSIS — N2581 Secondary hyperparathyroidism of renal origin: Secondary | ICD-10-CM | POA: Diagnosis not present

## 2023-09-17 DIAGNOSIS — D631 Anemia in chronic kidney disease: Secondary | ICD-10-CM | POA: Diagnosis not present

## 2023-09-19 DIAGNOSIS — N2581 Secondary hyperparathyroidism of renal origin: Secondary | ICD-10-CM | POA: Diagnosis not present

## 2023-09-19 DIAGNOSIS — N186 End stage renal disease: Secondary | ICD-10-CM | POA: Diagnosis not present

## 2023-09-19 DIAGNOSIS — D631 Anemia in chronic kidney disease: Secondary | ICD-10-CM | POA: Diagnosis not present

## 2023-09-19 DIAGNOSIS — D509 Iron deficiency anemia, unspecified: Secondary | ICD-10-CM | POA: Diagnosis not present

## 2023-09-21 ENCOUNTER — Ambulatory Visit: Admitting: Nurse Practitioner

## 2023-09-21 DIAGNOSIS — E559 Vitamin D deficiency, unspecified: Secondary | ICD-10-CM

## 2023-09-21 DIAGNOSIS — E039 Hypothyroidism, unspecified: Secondary | ICD-10-CM

## 2023-09-21 DIAGNOSIS — E782 Mixed hyperlipidemia: Secondary | ICD-10-CM

## 2023-09-21 DIAGNOSIS — Z01818 Encounter for other preprocedural examination: Secondary | ICD-10-CM | POA: Diagnosis not present

## 2023-09-21 DIAGNOSIS — I10 Essential (primary) hypertension: Secondary | ICD-10-CM

## 2023-09-21 DIAGNOSIS — N184 Chronic kidney disease, stage 4 (severe): Secondary | ICD-10-CM

## 2023-09-21 DIAGNOSIS — Z794 Long term (current) use of insulin: Secondary | ICD-10-CM

## 2023-09-21 NOTE — H&P (Signed)
 Patient Name: Anthony French Age: 68 y.o.   DOB: 01/30/1956 MRN: [id number]   [facility] Surgery H & P - Dialysis Access Procedure    Date of Encounter:   09/21/2023   Subjective      Chief Complaint:  Poor functioning of AV fistula.     HPI:  Returns for follow-up of vascular procedures/problems:   12/09/2022 underwent creation of left brachiocephalic AV fistula by Dr. Beola surgery at [facility] in [place], done for hemodialysis access. 07/10/2023 admitted to [facility] with acute MI, and CHF 07/10/2023 note comorbidities include CAD status post MI, CHF, dyslipidemia, diabetes, CKD 5 now progressed to ESRD (Dr. Harbaugh/nephrology). 07/20/2023 underwent CABG by Dr. Pearline surgery. 07/27/2023 seen in consultation by Dr. Fredrick specialist for AV fistulogram, because of poor functioning of AV fistula. 07/28/2023 underwent PTA to the left cephalic vein at the mid biceps, and at the axillary vein confluence with a Mustang balloon, done for mild to moderate stenosis at these 2 sites. 07/28/2023 AV fistulogram also showed multiple sidebranch tributary veins, that may be draining significant blood off the main trunk of the cephalic vein (5 of them total).  Discussed with his nephrologist Dr. Porfirio that if this AV fistula did not function well after this, I would recommend we ligate these multiple sidebranches, which would require an OR procedure. 07/31/2023 note dialysis nurses at [facility] were successful using this left brachiocephalic AV fistula twice, so seems to be functioning adequately.      This 68 year old male patient is referred back to our practice because apparently the dialysis nurses are still having trouble using his AV fistula for hemodialysis access.  His nephrologist Dr. Porfirio has requested that we proceed with ligation of these multiple sidebranch tributary veins, that we have discussed  earlier.          Prior to Admission medications  Medication Sig Start Date End Date Taking? Authorizing Provider  aspirin buffered (BUFFERIN,TRI-BUFFERED ASPIRIN) 325 MG TABS Take one tablet (325 mg dose) by mouth daily. 08/01/23   Harlene LOISE Marc, PA-C  atorvastatin  (LIPITOR) 80 mg tablet Take one tablet (80 mg dose) by mouth at bedtime. 08/01/23   Harlene LOISE Marc, PA-C  BD INSULIN  SYRINGE U/F 31G X 5/16 0.3 ML MISC USE WITH INSULIN  TWICE A DAY 11/04/17   Werner Capri, PA  Continuous Glucose Sensor (FREESTYLE LIBRE 3 PLUS SENSOR) MISC  08/18/23   Historical Provider, MD  famotidine (PEPCID) 20 mg tablet Take one tablet (20 mg dose) by mouth daily. 08/01/23   Harlene LOISE Marc, PA-C  hydrALAzine HCl (APRESOLINE) 100 mg tablet Take one tablet (100 mg dose) by mouth 3 (three) times a day. Patient not taking: Reported on 09/16/2023 09/14/23   Historical Provider, MD  hydrALAzine HCl (APRESOLINE) 25 mg tablet Take three tablets (75 mg dose) by mouth 2 (two) times daily. 08/01/23   Harlene LOISE Marc, PA-C  insulin  NPH-insulin  regular (NOVOLIN 70/30) (70-30) 100 UNIT/ML injection Inject ten Units into the skin with breakfast. 07/31/23   Harlene LOISE Marc, PA-C  insulin  NPH-insulin  regular (NOVOLIN 70/30) (70-30) 100 UNIT/ML injection Inject five Units into the skin every evening. 07/31/23   Harlene LOISE Marc, PA-C  levothyroxine  sodium (SYNTHROID ,LEVOTHROID,LOVOXYL) 50 mcg tablet Take one tablet (50 mcg dose) by mouth daily at 6 (six) am. 08/01/23   Harlene LOISE Marc, PA-C  metoprolol tartrate (LOPRESSOR) 25 mg tablet Take one tablet (25 mg dose) by mouth 2 (two) times daily. 08/19/23   Elspeth  T Shearer, PA  NIFEdipine (ADALAT CC) 90 mg 24 hr tablet Take one tablet (90 mg dose) by mouth daily. 09/14/23   Historical Provider, MD  rizatriptan  (MAXALT -MLT) 10 MG disintegrating tablet Take one tablet (10 mg dose) by mouth as needed for Migraine. 11/26/22   Historical Provider, MD     Current Medications[1]  Infusions:   Allergies[2]  Past Medical History:  Diagnosis Date  . CHF (congestive heart failure) (*)   . Coronary artery disease   . Diabetes mellitus (*)   . ESRD (end stage renal disease) on dialysis (*)   . ESRD (end stage renal disease) on dialysis (*)    t-th-s  . Hypertension   . NSTEMI (non-ST elevated myocardial infarction) (*) 07/10/2023  . S/P CABG (coronary artery bypass graft) 07/20/2023    Past Surgical History:  Procedure Laterality Date  . Appendectomy    . Av fistula placement Left   . Coronary angioplasty    . Coronary artery bypass graft  07/20/2023   3 vessel     No family history on file.  Social History[3]   Review of Systems: Constitutional: Negative for sweats, fevers, chills. Multiple organ system review as noted on the medical record. All other systems reviewed, and are reported negative, except as noted in HPI.   Objective   There were no vitals taken for this visit.  Last Weight  09/16/23 183 lb (83 kg)     Physical Exam:  GENERAL:  NAD.  Weight is 183 pounds, 83 kg.  BMI is 27. HEENT: Clear CHEST:  Clear breath sounds bilaterally. No wheezing.  HEART:  No S3.  ABDOMEN:  Nontender. EXTREMITIES: Scars in the left upper extremity.  Has a palpable thrill over the cephalic vein in the left upper arm.  No drainage or infection NEUROLOGIC:  Intact.   Labs:   No results found for this or any previous visit (from the past 24 hours).   Assessment/Plan   Assessment: ESRD Has a left brachiocephalic AV fistula, but it is not functioning well.  As discussed above, seems one of the problems is the presence of multiple sidebranch tributary veins off the main trunk of the cephalic vein. Multiple other medical problems as noted above.    Plan: Will proceed with ligation of the multiple accessory sidebranch veins off the main trunk of the left cephalic vein. Then AVFgram with possible  PTA.     Electronically signed: Lamar Adine Otter III MD 09/21/2023  /  6:07 AM            [1]  Current Outpatient Medications:  .  aspirin buffered (BUFFERIN,TRI-BUFFERED ASPIRIN) 325 MG TABS, Take one tablet (325 mg dose) by mouth daily., Disp: 360 tablet, Rfl: 0 .  atorvastatin  (LIPITOR) 80 mg tablet, Take one tablet (80 mg dose) by mouth at bedtime., Disp: 30 tablet, Rfl: 1 .  BD INSULIN  SYRINGE U/F 31G X 5/16 0.3 ML MISC, USE WITH INSULIN  TWICE A DAY, Disp: 100 each, Rfl: 4 .  Continuous Glucose Sensor (FREESTYLE LIBRE 3 PLUS SENSOR) MISC, , Disp: , Rfl:  .  famotidine (PEPCID) 20 mg tablet, Take one tablet (20 mg dose) by mouth daily., Disp: 60 tablet, Rfl: 0 .  hydrALAzine HCl (APRESOLINE) 100 mg tablet, Take one tablet (100 mg dose) by mouth 3 (three) times a day. (Patient not taking: Reported on 09/16/2023), Disp: , Rfl:  .  hydrALAzine HCl (APRESOLINE) 25 mg tablet, Take three tablets (75 mg dose) by mouth 2 (  two) times daily., Disp: 120 tablet, Rfl: 1 .  insulin  NPH-insulin  regular (NOVOLIN 70/30) (70-30) 100 UNIT/ML injection, Inject ten Units into the skin with breakfast., Disp: , Rfl:  .  insulin  NPH-insulin  regular (NOVOLIN 70/30) (70-30) 100 UNIT/ML injection, Inject five Units into the skin every evening., Disp: , Rfl:  .  levothyroxine  sodium (SYNTHROID ,LEVOTHROID,LOVOXYL) 50 mcg tablet, Take one tablet (50 mcg dose) by mouth daily at 6 (six) am., Disp: 30 tablet, Rfl: 1 .  metoprolol tartrate (LOPRESSOR) 25 mg tablet, Take one tablet (25 mg dose) by mouth 2 (two) times daily., Disp: 60 tablet, Rfl: 1 .  NIFEdipine (ADALAT CC) 90 mg 24 hr tablet, Take one tablet (90 mg dose) by mouth daily., Disp: , Rfl:  .  rizatriptan  (MAXALT -MLT) 10 MG disintegrating tablet, Take one tablet (10 mg dose) by mouth as needed for Migraine., Disp: , Rfl:  [2] Allergies Allergen Reactions  . Sulfa  Antibiotics Hives  . Unknown [Other] Hives    Pt states that he is allergic to  an ABT but he can not remember what it is  [3] Social History Socioeconomic History  . Marital status: Widowed  Tobacco Use  . Smoking status: Never  . Smokeless tobacco: Never  Vaping Use  . Vaping status: Never Used  Substance and Sexual Activity  . Alcohol use: No  . Drug use: No

## 2023-09-22 DIAGNOSIS — N186 End stage renal disease: Secondary | ICD-10-CM | POA: Diagnosis not present

## 2023-09-22 DIAGNOSIS — D509 Iron deficiency anemia, unspecified: Secondary | ICD-10-CM | POA: Diagnosis not present

## 2023-09-22 DIAGNOSIS — D631 Anemia in chronic kidney disease: Secondary | ICD-10-CM | POA: Diagnosis not present

## 2023-09-22 DIAGNOSIS — N2581 Secondary hyperparathyroidism of renal origin: Secondary | ICD-10-CM | POA: Diagnosis not present

## 2023-09-24 DIAGNOSIS — N2581 Secondary hyperparathyroidism of renal origin: Secondary | ICD-10-CM | POA: Diagnosis not present

## 2023-09-24 DIAGNOSIS — D631 Anemia in chronic kidney disease: Secondary | ICD-10-CM | POA: Diagnosis not present

## 2023-09-24 DIAGNOSIS — N186 End stage renal disease: Secondary | ICD-10-CM | POA: Diagnosis not present

## 2023-09-24 DIAGNOSIS — D509 Iron deficiency anemia, unspecified: Secondary | ICD-10-CM | POA: Diagnosis not present

## 2023-09-25 ENCOUNTER — Ambulatory Visit (INDEPENDENT_AMBULATORY_CARE_PROVIDER_SITE_OTHER): Admitting: Podiatry

## 2023-09-25 DIAGNOSIS — I252 Old myocardial infarction: Secondary | ICD-10-CM | POA: Diagnosis not present

## 2023-09-25 DIAGNOSIS — Z91199 Patient's noncompliance with other medical treatment and regimen due to unspecified reason: Secondary | ICD-10-CM

## 2023-09-25 DIAGNOSIS — E1122 Type 2 diabetes mellitus with diabetic chronic kidney disease: Secondary | ICD-10-CM | POA: Diagnosis not present

## 2023-09-25 DIAGNOSIS — T82898A Other specified complication of vascular prosthetic devices, implants and grafts, initial encounter: Secondary | ICD-10-CM | POA: Diagnosis not present

## 2023-09-25 DIAGNOSIS — Z452 Encounter for adjustment and management of vascular access device: Secondary | ICD-10-CM | POA: Diagnosis not present

## 2023-09-25 DIAGNOSIS — E1142 Type 2 diabetes mellitus with diabetic polyneuropathy: Secondary | ICD-10-CM | POA: Diagnosis not present

## 2023-09-25 DIAGNOSIS — N186 End stage renal disease: Secondary | ICD-10-CM | POA: Diagnosis not present

## 2023-09-25 DIAGNOSIS — I509 Heart failure, unspecified: Secondary | ICD-10-CM | POA: Diagnosis not present

## 2023-09-25 DIAGNOSIS — Z951 Presence of aortocoronary bypass graft: Secondary | ICD-10-CM | POA: Diagnosis not present

## 2023-09-25 DIAGNOSIS — D649 Anemia, unspecified: Secondary | ICD-10-CM | POA: Diagnosis not present

## 2023-09-25 DIAGNOSIS — Z794 Long term (current) use of insulin: Secondary | ICD-10-CM | POA: Diagnosis not present

## 2023-09-25 DIAGNOSIS — I2510 Atherosclerotic heart disease of native coronary artery without angina pectoris: Secondary | ICD-10-CM | POA: Diagnosis not present

## 2023-09-25 DIAGNOSIS — I132 Hypertensive heart and chronic kidney disease with heart failure and with stage 5 chronic kidney disease, or end stage renal disease: Secondary | ICD-10-CM | POA: Diagnosis not present

## 2023-09-25 NOTE — Progress Notes (Signed)
 No show

## 2023-09-26 DIAGNOSIS — N186 End stage renal disease: Secondary | ICD-10-CM | POA: Diagnosis not present

## 2023-09-26 DIAGNOSIS — N2581 Secondary hyperparathyroidism of renal origin: Secondary | ICD-10-CM | POA: Diagnosis not present

## 2023-09-26 DIAGNOSIS — D631 Anemia in chronic kidney disease: Secondary | ICD-10-CM | POA: Diagnosis not present

## 2023-09-26 DIAGNOSIS — D509 Iron deficiency anemia, unspecified: Secondary | ICD-10-CM | POA: Diagnosis not present

## 2023-09-27 DIAGNOSIS — J9601 Acute respiratory failure with hypoxia: Secondary | ICD-10-CM | POA: Diagnosis not present

## 2023-09-27 DIAGNOSIS — N186 End stage renal disease: Secondary | ICD-10-CM | POA: Diagnosis not present

## 2023-09-27 DIAGNOSIS — I169 Hypertensive crisis, unspecified: Secondary | ICD-10-CM | POA: Diagnosis not present

## 2023-09-27 DIAGNOSIS — J810 Acute pulmonary edema: Secondary | ICD-10-CM | POA: Diagnosis not present

## 2023-09-27 DIAGNOSIS — E8770 Fluid overload, unspecified: Secondary | ICD-10-CM | POA: Diagnosis not present

## 2023-09-27 DIAGNOSIS — Z881 Allergy status to other antibiotic agents status: Secondary | ICD-10-CM | POA: Diagnosis not present

## 2023-09-27 DIAGNOSIS — E1165 Type 2 diabetes mellitus with hyperglycemia: Secondary | ICD-10-CM | POA: Diagnosis not present

## 2023-09-27 DIAGNOSIS — R0602 Shortness of breath: Secondary | ICD-10-CM | POA: Diagnosis not present

## 2023-09-27 DIAGNOSIS — I132 Hypertensive heart and chronic kidney disease with heart failure and with stage 5 chronic kidney disease, or end stage renal disease: Secondary | ICD-10-CM | POA: Diagnosis not present

## 2023-09-27 DIAGNOSIS — R0682 Tachypnea, not elsewhere classified: Secondary | ICD-10-CM | POA: Diagnosis not present

## 2023-09-27 DIAGNOSIS — E039 Hypothyroidism, unspecified: Secondary | ICD-10-CM | POA: Diagnosis not present

## 2023-09-27 DIAGNOSIS — D631 Anemia in chronic kidney disease: Secondary | ICD-10-CM | POA: Diagnosis not present

## 2023-09-27 DIAGNOSIS — Z992 Dependence on renal dialysis: Secondary | ICD-10-CM | POA: Diagnosis not present

## 2023-09-27 DIAGNOSIS — E1122 Type 2 diabetes mellitus with diabetic chronic kidney disease: Secondary | ICD-10-CM | POA: Diagnosis not present

## 2023-09-27 DIAGNOSIS — Z882 Allergy status to sulfonamides status: Secondary | ICD-10-CM | POA: Diagnosis not present

## 2023-09-27 DIAGNOSIS — Z794 Long term (current) use of insulin: Secondary | ICD-10-CM | POA: Diagnosis not present

## 2023-09-27 DIAGNOSIS — Z79899 Other long term (current) drug therapy: Secondary | ICD-10-CM | POA: Diagnosis not present

## 2023-09-27 DIAGNOSIS — J9811 Atelectasis: Secondary | ICD-10-CM | POA: Diagnosis not present

## 2023-09-27 DIAGNOSIS — Z951 Presence of aortocoronary bypass graft: Secondary | ICD-10-CM | POA: Diagnosis not present

## 2023-09-27 DIAGNOSIS — J90 Pleural effusion, not elsewhere classified: Secondary | ICD-10-CM | POA: Diagnosis not present

## 2023-09-27 DIAGNOSIS — R0781 Pleurodynia: Secondary | ICD-10-CM | POA: Diagnosis not present

## 2023-09-27 DIAGNOSIS — I2510 Atherosclerotic heart disease of native coronary artery without angina pectoris: Secondary | ICD-10-CM | POA: Diagnosis not present

## 2023-09-27 DIAGNOSIS — R7989 Other specified abnormal findings of blood chemistry: Secondary | ICD-10-CM | POA: Diagnosis not present

## 2023-09-27 DIAGNOSIS — Z7982 Long term (current) use of aspirin: Secondary | ICD-10-CM | POA: Diagnosis not present

## 2023-09-27 DIAGNOSIS — E785 Hyperlipidemia, unspecified: Secondary | ICD-10-CM | POA: Diagnosis not present

## 2023-09-27 DIAGNOSIS — I509 Heart failure, unspecified: Secondary | ICD-10-CM | POA: Diagnosis not present

## 2023-09-28 DIAGNOSIS — J810 Acute pulmonary edema: Secondary | ICD-10-CM | POA: Diagnosis not present

## 2023-09-28 DIAGNOSIS — Z992 Dependence on renal dialysis: Secondary | ICD-10-CM | POA: Diagnosis not present

## 2023-09-28 DIAGNOSIS — I169 Hypertensive crisis, unspecified: Secondary | ICD-10-CM | POA: Diagnosis not present

## 2023-09-28 DIAGNOSIS — N186 End stage renal disease: Secondary | ICD-10-CM | POA: Diagnosis not present

## 2023-09-28 DIAGNOSIS — J9601 Acute respiratory failure with hypoxia: Secondary | ICD-10-CM | POA: Diagnosis not present

## 2023-09-29 DIAGNOSIS — D509 Iron deficiency anemia, unspecified: Secondary | ICD-10-CM | POA: Diagnosis not present

## 2023-09-29 DIAGNOSIS — N2581 Secondary hyperparathyroidism of renal origin: Secondary | ICD-10-CM | POA: Diagnosis not present

## 2023-09-29 DIAGNOSIS — D631 Anemia in chronic kidney disease: Secondary | ICD-10-CM | POA: Diagnosis not present

## 2023-09-29 DIAGNOSIS — N186 End stage renal disease: Secondary | ICD-10-CM | POA: Diagnosis not present

## 2023-10-01 DIAGNOSIS — N186 End stage renal disease: Secondary | ICD-10-CM | POA: Diagnosis not present

## 2023-10-01 DIAGNOSIS — D509 Iron deficiency anemia, unspecified: Secondary | ICD-10-CM | POA: Diagnosis not present

## 2023-10-01 DIAGNOSIS — N2581 Secondary hyperparathyroidism of renal origin: Secondary | ICD-10-CM | POA: Diagnosis not present

## 2023-10-01 DIAGNOSIS — D631 Anemia in chronic kidney disease: Secondary | ICD-10-CM | POA: Diagnosis not present

## 2023-10-03 DIAGNOSIS — N2581 Secondary hyperparathyroidism of renal origin: Secondary | ICD-10-CM | POA: Diagnosis not present

## 2023-10-03 DIAGNOSIS — D509 Iron deficiency anemia, unspecified: Secondary | ICD-10-CM | POA: Diagnosis not present

## 2023-10-03 DIAGNOSIS — N186 End stage renal disease: Secondary | ICD-10-CM | POA: Diagnosis not present

## 2023-10-03 DIAGNOSIS — D631 Anemia in chronic kidney disease: Secondary | ICD-10-CM | POA: Diagnosis not present

## 2023-10-06 DIAGNOSIS — N186 End stage renal disease: Secondary | ICD-10-CM | POA: Diagnosis not present

## 2023-10-06 DIAGNOSIS — D631 Anemia in chronic kidney disease: Secondary | ICD-10-CM | POA: Diagnosis not present

## 2023-10-06 DIAGNOSIS — N2581 Secondary hyperparathyroidism of renal origin: Secondary | ICD-10-CM | POA: Diagnosis not present

## 2023-10-06 DIAGNOSIS — D509 Iron deficiency anemia, unspecified: Secondary | ICD-10-CM | POA: Diagnosis not present

## 2023-10-07 DIAGNOSIS — H43823 Vitreomacular adhesion, bilateral: Secondary | ICD-10-CM | POA: Diagnosis not present

## 2023-10-07 DIAGNOSIS — H2512 Age-related nuclear cataract, left eye: Secondary | ICD-10-CM | POA: Diagnosis not present

## 2023-10-07 DIAGNOSIS — E113313 Type 2 diabetes mellitus with moderate nonproliferative diabetic retinopathy with macular edema, bilateral: Secondary | ICD-10-CM | POA: Diagnosis not present

## 2023-10-07 DIAGNOSIS — H35033 Hypertensive retinopathy, bilateral: Secondary | ICD-10-CM | POA: Diagnosis not present

## 2023-10-07 DIAGNOSIS — Z961 Presence of intraocular lens: Secondary | ICD-10-CM | POA: Diagnosis not present

## 2023-10-07 LAB — HM DIABETES EYE EXAM

## 2023-10-08 ENCOUNTER — Ambulatory Visit: Payer: Self-pay | Admitting: Family Medicine

## 2023-10-08 ENCOUNTER — Encounter: Payer: Self-pay | Admitting: Family Medicine

## 2023-10-08 DIAGNOSIS — N2581 Secondary hyperparathyroidism of renal origin: Secondary | ICD-10-CM | POA: Diagnosis not present

## 2023-10-08 DIAGNOSIS — D509 Iron deficiency anemia, unspecified: Secondary | ICD-10-CM | POA: Diagnosis not present

## 2023-10-08 DIAGNOSIS — D631 Anemia in chronic kidney disease: Secondary | ICD-10-CM | POA: Diagnosis not present

## 2023-10-08 DIAGNOSIS — N186 End stage renal disease: Secondary | ICD-10-CM | POA: Diagnosis not present

## 2023-10-10 DIAGNOSIS — D509 Iron deficiency anemia, unspecified: Secondary | ICD-10-CM | POA: Diagnosis not present

## 2023-10-10 DIAGNOSIS — N186 End stage renal disease: Secondary | ICD-10-CM | POA: Diagnosis not present

## 2023-10-10 DIAGNOSIS — D631 Anemia in chronic kidney disease: Secondary | ICD-10-CM | POA: Diagnosis not present

## 2023-10-10 DIAGNOSIS — N2581 Secondary hyperparathyroidism of renal origin: Secondary | ICD-10-CM | POA: Diagnosis not present

## 2023-10-11 DIAGNOSIS — N186 End stage renal disease: Secondary | ICD-10-CM | POA: Diagnosis not present

## 2023-10-11 DIAGNOSIS — Z992 Dependence on renal dialysis: Secondary | ICD-10-CM | POA: Diagnosis not present

## 2023-10-13 DIAGNOSIS — D509 Iron deficiency anemia, unspecified: Secondary | ICD-10-CM | POA: Diagnosis not present

## 2023-10-13 DIAGNOSIS — D631 Anemia in chronic kidney disease: Secondary | ICD-10-CM | POA: Diagnosis not present

## 2023-10-13 DIAGNOSIS — N186 End stage renal disease: Secondary | ICD-10-CM | POA: Diagnosis not present

## 2023-10-13 DIAGNOSIS — N2581 Secondary hyperparathyroidism of renal origin: Secondary | ICD-10-CM | POA: Diagnosis not present

## 2023-10-13 NOTE — Progress Notes (Signed)
3rd attempt to contact patient. No answer. 

## 2023-10-14 DIAGNOSIS — H25812 Combined forms of age-related cataract, left eye: Secondary | ICD-10-CM | POA: Diagnosis not present

## 2023-10-14 HISTORY — PX: CATARACT EXTRACTION: SUR2

## 2023-10-15 DIAGNOSIS — N186 End stage renal disease: Secondary | ICD-10-CM | POA: Diagnosis not present

## 2023-10-15 DIAGNOSIS — D509 Iron deficiency anemia, unspecified: Secondary | ICD-10-CM | POA: Diagnosis not present

## 2023-10-15 DIAGNOSIS — N2581 Secondary hyperparathyroidism of renal origin: Secondary | ICD-10-CM | POA: Diagnosis not present

## 2023-10-15 DIAGNOSIS — D631 Anemia in chronic kidney disease: Secondary | ICD-10-CM | POA: Diagnosis not present

## 2023-10-17 DIAGNOSIS — D631 Anemia in chronic kidney disease: Secondary | ICD-10-CM | POA: Diagnosis not present

## 2023-10-17 DIAGNOSIS — D509 Iron deficiency anemia, unspecified: Secondary | ICD-10-CM | POA: Diagnosis not present

## 2023-10-17 DIAGNOSIS — N2581 Secondary hyperparathyroidism of renal origin: Secondary | ICD-10-CM | POA: Diagnosis not present

## 2023-10-17 DIAGNOSIS — N186 End stage renal disease: Secondary | ICD-10-CM | POA: Diagnosis not present

## 2023-10-19 ENCOUNTER — Encounter: Payer: Self-pay | Admitting: Family Medicine

## 2023-10-19 ENCOUNTER — Ambulatory Visit: Payer: Medicare Other | Admitting: Family Medicine

## 2023-10-19 VITALS — BP 143/71 | HR 91 | Temp 97.6°F | Ht 69.0 in | Wt 179.1 lb

## 2023-10-19 DIAGNOSIS — I152 Hypertension secondary to endocrine disorders: Secondary | ICD-10-CM

## 2023-10-19 DIAGNOSIS — I252 Old myocardial infarction: Secondary | ICD-10-CM

## 2023-10-19 DIAGNOSIS — Z23 Encounter for immunization: Secondary | ICD-10-CM

## 2023-10-19 DIAGNOSIS — E1122 Type 2 diabetes mellitus with diabetic chronic kidney disease: Secondary | ICD-10-CM

## 2023-10-19 DIAGNOSIS — Z992 Dependence on renal dialysis: Secondary | ICD-10-CM | POA: Diagnosis not present

## 2023-10-19 DIAGNOSIS — E785 Hyperlipidemia, unspecified: Secondary | ICD-10-CM | POA: Diagnosis not present

## 2023-10-19 DIAGNOSIS — Z794 Long term (current) use of insulin: Secondary | ICD-10-CM | POA: Diagnosis not present

## 2023-10-19 DIAGNOSIS — E1159 Type 2 diabetes mellitus with other circulatory complications: Secondary | ICD-10-CM | POA: Diagnosis not present

## 2023-10-19 DIAGNOSIS — Z125 Encounter for screening for malignant neoplasm of prostate: Secondary | ICD-10-CM

## 2023-10-19 DIAGNOSIS — E1142 Type 2 diabetes mellitus with diabetic polyneuropathy: Secondary | ICD-10-CM

## 2023-10-19 DIAGNOSIS — E1169 Type 2 diabetes mellitus with other specified complication: Secondary | ICD-10-CM | POA: Diagnosis not present

## 2023-10-19 DIAGNOSIS — E113293 Type 2 diabetes mellitus with mild nonproliferative diabetic retinopathy without macular edema, bilateral: Secondary | ICD-10-CM | POA: Diagnosis not present

## 2023-10-19 DIAGNOSIS — K219 Gastro-esophageal reflux disease without esophagitis: Secondary | ICD-10-CM | POA: Diagnosis not present

## 2023-10-19 DIAGNOSIS — N186 End stage renal disease: Secondary | ICD-10-CM | POA: Diagnosis not present

## 2023-10-19 DIAGNOSIS — D692 Other nonthrombocytopenic purpura: Secondary | ICD-10-CM | POA: Insufficient documentation

## 2023-10-19 LAB — BAYER DCA HB A1C WAIVED: HB A1C (BAYER DCA - WAIVED): 5.2 % (ref 4.8–5.6)

## 2023-10-19 MED ORDER — FAMOTIDINE 10 MG PO TABS: 10.0000 mg | ORAL_TABLET | ORAL | 3 refills | Status: AC

## 2023-10-19 MED ORDER — METOPROLOL TARTRATE 25 MG PO TABS: 12.5000 mg | ORAL_TABLET | Freq: Two times a day (BID) | ORAL | 3 refills | Status: AC

## 2023-10-19 NOTE — Patient Instructions (Signed)
 Preventive Care 73 Years and Older, Male Preventive care refers to lifestyle choices and visits with your health care provider that can promote health and wellness. Preventive care visits are also called wellness exams. What can I expect for my preventive care visit? Counseling During your preventive care visit, your health care provider may ask about your: Medical history, including: Past medical problems. Family medical history. History of falls. Current health, including: Emotional well-being. Home life and relationship well-being. Sexual activity. Memory and ability to understand (cognition). Lifestyle, including: Alcohol, nicotine or tobacco, and drug use. Access to firearms. Diet, exercise, and sleep habits. Work and work Astronomer. Sunscreen use. Safety issues such as seatbelt and bike helmet use. Physical exam Your health care provider will check your: Height and weight. These may be used to calculate your BMI (body mass index). BMI is a measurement that tells if you are at a healthy weight. Waist circumference. This measures the distance around your waistline. This measurement also tells if you are at a healthy weight and may help predict your risk of certain diseases, such as type 2 diabetes and high blood pressure. Heart rate and blood pressure. Body temperature. Skin for abnormal spots. What immunizations do I need?  Vaccines are usually given at various ages, according to a schedule. Your health care provider will recommend vaccines for you based on your age, medical history, and lifestyle or other factors, such as travel or where you work. What tests do I need? Screening Your health care provider may recommend screening tests for certain conditions. This may include: Lipid and cholesterol levels. Diabetes screening. This is done by checking your blood sugar (glucose) after you have not eaten for a while (fasting). Hepatitis C test. Hepatitis B test. HIV (human  immunodeficiency virus) test. STI (sexually transmitted infection) testing, if you are at risk. Lung cancer screening. Colorectal cancer screening. Prostate cancer screening. Abdominal aortic aneurysm (AAA) screening. You may need this if you are a current or former smoker. Talk with your health care provider about your test results, treatment options, and if necessary, the need for more tests. Follow these instructions at home: Eating and drinking  Eat a diet that includes fresh fruits and vegetables, whole grains, lean protein, and low-fat dairy products. Limit your intake of foods with high amounts of sugar, saturated fats, and salt. Take vitamin and mineral supplements as recommended by your health care provider. Do not drink alcohol if your health care provider tells you not to drink. If you drink alcohol: Limit how much you have to 0-2 drinks a day. Know how much alcohol is in your drink. In the [place] one drink equals one 12 oz bottle of beer (355 mL), one 5 oz glass of wine (148 mL), or one 1 oz glass of hard liquor (44 mL). Lifestyle Brush your teeth every morning and night with fluoride toothpaste. Floss one time each day. Exercise for at least 30 minutes 5 or more days each week. Do not use any products that contain nicotine or tobacco. These products include cigarettes, chewing tobacco, and vaping devices, such as e-cigarettes. If you need help quitting, ask your health care provider. Do not use drugs. If you are sexually active, practice safe sex. Use a condom or other form of protection to prevent STIs. Take aspirin only as told by your health care provider. Make sure that you understand how much to take and what form to take. Work with your health care provider to find out whether it is safe  and beneficial for you to take aspirin daily. Ask your health care provider if you need to take a cholesterol-lowering medicine (statin). Find healthy ways to manage stress, such  as: Meditation, yoga, or listening to music. Journaling. Talking to a trusted person. Spending time with friends and family. Safety Always wear your seat belt while driving or riding in a vehicle. Do not drive: If you have been drinking alcohol. Do not ride with someone who has been drinking. When you are tired or distracted. While texting. If you have been using any mind-altering substances or drugs. Wear a helmet and other protective equipment during sports activities. If you have firearms in your house, make sure you follow all gun safety procedures. Minimize exposure to UV radiation to reduce your risk of skin cancer. What's next? Visit your health care provider once a year for an annual wellness visit. Ask your health care provider how often you should have your eyes and teeth checked. Stay up to date on all vaccines. This information is not intended to replace advice given to you by your health care provider. Make sure you discuss any questions you have with your health care provider. Document Revised: 07/25/2020 Document Reviewed: 07/25/2020 Elsevier Patient Education  2024 [employer].

## 2023-10-19 NOTE — Progress Notes (Signed)
 Anthony French is a 68 y.o. male presents to office today for annual physical exam examination.  Anthony and daughter joins for today's visit.  Discussed the use of AI scribe software for clinical note transcription with the patient, who gave verbal consent to proceed.  History of Present Illness   Anthony French is a 68 year old male with hypertension and end-stage renal disease on dialysis who presents for follow-up of blood pressure management and dialysis care.  He is compliant with all meds. Was seen in ER for hypertensive emergency where he developed fluid on his lungs and subsequently underwent dialysis. He reports high blood pressure readings at home but thinks his meter may not be accurate, as his blood pressure is monitored during dialysis sessions and are normal typically. His daughter was contacted once regarding high blood pressure during dialysis. He is currently on metoprolol  and famotidine .  Needs referral to cardiology in [place].  He underwent cataract surgery last week and reports improved vision, though he still requires glasses for certain tasks. He is scheduled for a follow-up with his eye doctor.  He continues to produce urine, urinating three to four times a day, but requires dialysis for waste filtration. No blood in stool or scrotal concerns. He has a bruise on his skin that has been present for a couple of months, unrelated to dialysis needle sites.  He experiences occasional low blood sugar episodes, particularly after dialysis sessions, with readings as low as 55 mg/dL. He attributes this to not eating enough during dialysis.  No recent issues with hearing or swallowing, although his daughter disagrees about his hearing. He is awaiting an audiology appointment, which has not yet been scheduled.  He missed two podiatry appointments due to hospitalization for a heart attack and a surgical revision, and has not yet rescheduled. No new skin lesions or changes in family  medical history since his last visit. He is no smoking, drinking, or using drugs.      Occupation: retired, Marital status: significant other Anthony French, Substance use: none There are no preventive care reminders to display for this patient. Refills needed today: Pepcid / Metoprolol   Immunization History  Administered Date(s) Administered   Fluad Quad(high Dose 65+) 11/02/2019, 12/27/2020   Fluad Trivalent(High Dose 65+) 11/19/2022   INFLUENZA, HIGH DOSE SEASONAL PF 10/19/2023   Influenza Inj Mdck Quad With Preservative 11/26/2015   Influenza,inj,Quad PF,6+ Mos 12/25/2016   Pneumococcal Conjugate-13 12/27/2018   Pneumococcal Polysaccharide-23 04/16/2017   Tdap 08/19/2017   Zoster Recombinant(Shingrix) 07/28/2019, 09/07/2020   Past Medical History:  Diagnosis Date   Diabetes (HCC)    Diabetic neuropathy (HCC)    Essential hypertension, benign 06/01/2019   Hyperlipidemia    Social History   Socioeconomic History   Marital status: Single    Spouse name: Not on file   Number of children: 3   Years of education: Not on file   Highest education level: Not on file  Occupational History   Occupation: retired  Tobacco Use   Smoking status: Never   Smokeless tobacco: Never  Vaping Use   Vaping status: Never Used  Substance and Sexual Activity   Alcohol use: Not Currently   Drug use: Never   Sexual activity: Not Currently  Other Topics Concern   Not on file  Social History Narrative   Not on file   Social Drivers of Health   Financial Resource Strain: Low Risk  (08/19/2023)   Received from [facility]   Overall Financial Resource Strain (  CARDIA)    Difficulty of Paying Living Expenses: Not very hard  Food Insecurity: No Food Insecurity (09/25/2023)   [facility]   Hunger Vital Sign    Within the past 12 months, you worried that your food would run out before you got the money to buy more.: Never true    Within the past 12 months, the food you bought  just didn't last and you didn't have money to get more.: Never true  Transportation Needs: No Transportation Needs (09/25/2023)   Received from [facility] - Transportation    In the past 12 months, has lack of transportation kept you from medical appointments or from getting medications?: No    In the past 12 months, has lack of transportation kept you from meetings, work, or from getting things needed for daily living?: No  Physical Activity: Insufficiently Active (06/26/2023)   Exercise Vital Sign    Days of Exercise per Week: 3 days    Minutes of Exercise per Session: 10 min  Stress: No Stress Concern Present (09/25/2023)   Received from [facility] - Occupational Stress Questionnaire    Do you feel stress - tense, restless, nervous, or anxious, or unable to sleep at night because your mind is troubled all the time - these days?: Not at all  Social Connections: Moderately Integrated (06/26/2023)   Social Connection and Isolation Panel    Frequency of Communication with Friends and Family: Twice a week    Frequency of Social Gatherings with Friends and Family: Twice a week    Attends Religious Services: More than 4 times per year    Active Member of [employer] or Organizations: Yes    Attends Banker Meetings: More than 4 times per year    Marital Status: Widowed  Intimate Partner Violence: Not At Risk (09/27/2023)   Received from [facility]   HITS    Over the last 12 months how often did your partner physically hurt you?: Never    Over the last 12 months how often did your partner insult you or talk down to you?: Never    Over the last 12 months how often did your partner threaten you with physical harm?: Never    Over the last 12 months how often did your partner scream or curse at you?: Never   Past Surgical History:  Procedure Laterality Date   APPENDECTOMY  1980   AV FISTULA PLACEMENT Left 12/09/2022   Procedure:  LEFT ARM BRACHIOCEPHALIC ARTERIOVENOUS (AV) FISTULA CREATION;  Surgeon: Lanis Fonda BRAVO, MD;  Location: [facility] OR;  Service: Vascular;  Laterality: Left;   CATARACT EXTRACTION Left 10/14/2023   CYST REMOVAL TRUNK     Family History  Problem Relation Age of Onset   Stroke Mother    Diabetes Mother    Hypertension Mother    Hyperlipidemia Mother    Kidney disease Mother    COPD Father    Diabetes Sister    Stroke Brother    Diabetes Brother    Arthritis Maternal Grandmother     Current Outpatient Medications:    amiodarone (PACERONE) 200 MG tablet, Take 200 mg by mouth daily., Disp: , Rfl:    aspirin EC 325 MG tablet, Take 325 mg by mouth daily., Disp: , Rfl:    atorvastatin  (LIPITOR) 80 MG tablet, Take 80 mg by mouth at bedtime., Disp: , Rfl:    blood glucose meter kit and  supplies, Dispense based on patient and insurance preference. Use up to four times daily as directed. (FOR ICD-10 E10.9, E11.9). Check blood sugar twice a day as directed., Disp: 1 each, Rfl: 0   chlorthalidone (HYGROTON) 25 MG tablet, Take 12.5 mg by mouth daily., Disp: , Rfl:    Continuous Glucose Receiver (FREESTYLE LIBRE 3 READER) DEVI, 1 each by Does not apply route in the morning, at noon, in the evening, and at bedtime. Use to check BGs E11.9, Disp: 1 each, Rfl: 0   Continuous Glucose Sensor (FREESTYLE LIBRE 3 SENSOR) MISC, PLACE 1 SENSOR ON THE SKIN EVERY 14 DAYS. USE TO CHECK GLUCOSE CONTINUOUSLY E11.9, Disp: 6 each, Rfl: 3   famotidine  (PEPCID  AC) 10 MG tablet, Take 1 tablet (10 mg total) by mouth every other day. **dose change for kidneys, Disp: 45 tablet, Rfl: 3   glucose blood (ACCU-CHEK GUIDE) test strip, Test blood sugar 4 times per day, before meals and at bedtime., Disp: 500 strip, Rfl: 3   hydrALAZINE (APRESOLINE) 25 MG tablet, Take 75 mg by mouth in the morning and at bedtime., Disp: , Rfl:    insulin  NPH-regular Human (70-30) 100 UNIT/ML injection, Inject 15-20 Units into the skin See admin  instructions. Inject 20 units before breakfast and 15 units before supper  06/26/23 per pt inj 10 before breakfast and 5 units before supper per pt, Disp: , Rfl:    Insulin  Pen Needle (PEN NEEDLES) 31G X 8 MM MISC, 1 each by Does not apply route in the morning and at bedtime., Disp: 100 each, Rfl: 11   Insulin  Syringe-Needle U-100 (BD INSULIN  SYRINGE U/F) 31G X 5/16 0.3 ML MISC, USE WITH INSULIN  TWICE  DAILY DX E11.22, Disp: 200 each, Rfl: 3   levothyroxine  (SYNTHROID ) 50 MCG tablet, Take 1 tablet (50 mcg total) by mouth daily before breakfast., Disp: 100 tablet, Rfl: 0   Multiple Vitamin (MULTIVITAMIN WITH MINERALS) TABS tablet, Take 1 tablet by mouth daily., Disp: , Rfl:    oxyCODONE -acetaminophen  (PERCOCET) 5-325 MG tablet, Take 1 tablet by mouth every 6 (six) hours as needed for severe pain (pain score 7-10)., Disp: 10 tablet, Rfl: 0   rizatriptan  (MAXALT -MLT) 10 MG disintegrating tablet, TAKE 1 TABLET BY MOUTH AS NEEDED FOR MIGRAINE. MAY REPEAT IN 2 HOURS IF NEEDED, Disp: 10 tablet, Rfl: 2   sodium bicarbonate 650 MG tablet, Take 650 mg by mouth 3 (three) times daily., Disp: , Rfl:    sodium zirconium cyclosilicate (LOKELMA) 10 g PACK packet, Take 10 g by mouth daily., Disp: , Rfl:    metoprolol  tartrate (LOPRESSOR ) 25 MG tablet, Take 0.5 tablets (12.5 mg total) by mouth 2 (two) times daily., Disp: 45 tablet, Rfl: 3  Allergies  Allergen Reactions   Other Hives    Pt states that he is allergic to an ABT but he can not remember what it is   Sulfa  Antibiotics Hives     ROS: Review of Systems Pertinent items noted in HPI and remainder of comprehensive ROS otherwise negative.    Physical exam BP (!) 143/71   Pulse 91   Temp 97.6 F (36.4 C)   Ht 5' 9 (1.753 m)   Wt 179 lb 2 oz (81.3 kg)   SpO2 94%   BMI 26.45 kg/m  General appearance: alert, cooperative, appears stated age, and no distress Head: Normocephalic, without obvious abnormality, atraumatic Eyes: negative findings: lids  and lashes normal, conjunctivae and sclerae normal, corneas clear, and pupils equal, round, reactive to light  and accomodation Ears: normal TM's and external ear canals both ears Nose: Nares normal. Septum midline. Mucosa normal. No drainage or sinus tenderness. Throat: absent teeth, oropharynx without exudates/ lesions Neck: no adenopathy, no carotid bruit, supple, symmetrical, trachea midline, and thyroid  not enlarged, symmetric, no tenderness/mass/nodules Back: symmetric, no curvature. ROM normal. No CVA tenderness. Lungs: clear to auscultation bilaterally Chest wall: no tenderness Heart: regular rate and rhythm, S1, S2 normal, no murmur, click, rub or gallop Abdomen: soft, non-tender; bowel sounds normal; no masses,  no organomegaly Extremities: Left upper extremity with fistula present with palpable thrill. Pulses: 2+ and symmetric Skin: Has senile purpura along the forearms bilaterally Lymph nodes: Cervical, supraclavicular, and axillary nodes normal. Neurologic: Grossly normal      10/19/2023   10:51 AM 08/03/2023    2:15 PM 06/26/2023    9:12 AM  Depression screen PHQ 2/9  Decreased Interest 0 0 0  Down, Depressed, Hopeless 0 0 0  PHQ - 2 Score 0 0 0  Altered sleeping 0  0  Tired, decreased energy 0  0  Change in appetite 0  1  Feeling bad or failure about yourself  0  0  Trouble concentrating 0  0  Moving slowly or fidgety/restless 0  0  Suicidal thoughts 0  0  PHQ-9 Score 0  1  Difficult doing work/chores Not difficult at all  Not difficult at all      10/19/2023   10:52 AM 08/19/2023    9:14 AM 08/03/2023    2:15 PM 02/25/2023   11:09 AM  GAD 7 : Generalized Anxiety Score  Nervous, Anxious, on Edge 0 0 0 0  Control/stop worrying 0 0 0 0  Worry too much - different things 0 0 0 0  Trouble relaxing 0 0 0 0  Restless 0 0 0 0  Easily annoyed or irritable 0 0 0 0  Afraid - awful might happen 0 0 0 0  Total GAD 7 Score 0 0 0 0  Anxiety Difficulty Not difficult at all Not  difficult at all  Not difficult at all     Assessment/ Plan: [name] here for annual physical exam.   Type 2 diabetes mellitus with chronic kidney disease on chronic dialysis, with long-term current use of insulin  (HCC) - Plan: Bayer DCA Hb A1c Waived, CMP14+EGFR  ESRD (end stage renal disease) on dialysis (HCC) - Plan: CMP14+EGFR, PSA  Hyperlipidemia associated with type 2 diabetes mellitus (HCC) - Plan: CMP14+EGFR, TSH + free T4, Lipid Panel  Hypertension associated with diabetes (HCC) - Plan: CMP14+EGFR, metoprolol  tartrate (LOPRESSOR ) 25 MG tablet, Ambulatory referral to Cardiology  Diabetic polyneuropathy associated with type 2 diabetes mellitus (HCC) - Plan: CMP14+EGFR  Mild nonproliferative diabetic retinopathy of both eyes associated with type 2 diabetes mellitus, macular edema presence unspecified (HCC) - Plan: CMP14+EGFR  Senile purpura (HCC)  History of non-ST elevation myocardial infarction (NSTEMI) - Plan: metoprolol  tartrate (LOPRESSOR ) 25 MG tablet, Ambulatory referral to Cardiology  Gastroesophageal reflux disease without esophagitis - Plan: famotidine  (PEPCID  AC) 10 MG tablet  Screening for malignant neoplasm of prostate - Plan: PSA  Encounter for immunization - Plan: Flu vaccine HIGH DOSE PF(Fluzone Trivalent)  Assessment and Plan    Type 2 diabetes mellitus with hypertension, hyperlipidemia, retinopathy, neuropathy, and circulatory complications and with End stage renal disease on hemodialysis On hemodialysis for renal disease. Fluid overload issue resolved. Awaiting kidney transplant evaluation at [facility]. Produces urine but requires dialysis. - [facility] evaluation. - Monitor fluid status and dry weight. - reduce pepcid  to 10mg  QOD, renally dosed. - Encourage taking snacks or juice during dialysis to prevent hypoglycemia. - Refill metoprolol  prescription and [facility]. - Bring home blood  pressure machine to next appointment for accuracy check.  History of NSTEMI History of myocardial infarction. Advised cardiology follow-up not yet completed. - Arrange cardiology referral through [facility] system in [place].  Hearing loss Hearing loss noted. Audiology appointment pending scheduling. - Check on audiology appointment and ensure correct contact information is provided.     GERD - Pepcid  renally dosed. Discussed change with patient  Senile purpura - Reassurance  Counseled on healthy lifestyle choices, including diet (rich in fruits, vegetables and lean meats and low in salt and simple carbohydrates) and exercise (at least 30 minutes of moderate physical activity daily).  Patient to follow up 20m for DM and general check up  Edel Rivero M. Jolinda, DO

## 2023-10-20 ENCOUNTER — Ambulatory Visit: Payer: Self-pay | Admitting: Family Medicine

## 2023-10-20 DIAGNOSIS — D631 Anemia in chronic kidney disease: Secondary | ICD-10-CM | POA: Diagnosis not present

## 2023-10-20 DIAGNOSIS — D509 Iron deficiency anemia, unspecified: Secondary | ICD-10-CM | POA: Diagnosis not present

## 2023-10-20 DIAGNOSIS — N186 End stage renal disease: Secondary | ICD-10-CM | POA: Diagnosis not present

## 2023-10-20 DIAGNOSIS — N2581 Secondary hyperparathyroidism of renal origin: Secondary | ICD-10-CM | POA: Diagnosis not present

## 2023-10-20 LAB — CMP14+EGFR
ALT: 7 IU/L (ref 0–44)
AST: 15 IU/L (ref 0–40)
Albumin: 4.2 g/dL (ref 3.9–4.9)
Alkaline Phosphatase: 114 IU/L (ref 44–121)
BUN/Creatinine Ratio: 7 — ABNORMAL LOW (ref 10–24)
BUN: 30 mg/dL — ABNORMAL HIGH (ref 8–27)
Bilirubin Total: 0.4 mg/dL (ref 0.0–1.2)
CO2: 21 mmol/L (ref 20–29)
Calcium: 9.2 mg/dL (ref 8.6–10.2)
Chloride: 102 mmol/L (ref 96–106)
Creatinine, Ser: 4.28 mg/dL (ref 0.76–1.27)
Globulin, Total: 1.9 g/dL (ref 1.5–4.5)
Glucose: 206 mg/dL — ABNORMAL HIGH (ref 70–99)
Potassium: 4.6 mmol/L (ref 3.5–5.2)
Sodium: 141 mmol/L (ref 134–144)
Total Protein: 6.1 g/dL (ref 6.0–8.5)
eGFR: 14 mL/min/1.73 — ABNORMAL LOW (ref >59)

## 2023-10-20 LAB — PSA: Prostate Specific Ag, Serum: 3.6 ng/mL (ref 0.0–4.0)

## 2023-10-20 LAB — TSH: TSH: 2.58 u[IU]/mL (ref 0.450–4.500)

## 2023-10-20 LAB — LIPID PANEL
Chol/HDL Ratio: 4.5 ratio (ref 0.0–5.0)
Cholesterol, Total: 172 mg/dL (ref 100–199)
HDL: 38 mg/dL — ABNORMAL LOW (ref >39)
LDL Chol Calc (NIH): 102 mg/dL — ABNORMAL HIGH (ref 0–99)
Triglycerides: 182 mg/dL — ABNORMAL HIGH (ref 0–149)
VLDL Cholesterol Cal: 32 mg/dL (ref 5–40)

## 2023-10-20 LAB — TSH+FREE T4
Free T4: 1.54 ng/dL (ref 0.82–1.77)
TSH: 2.61 u[IU]/mL (ref 0.450–4.500)

## 2023-10-20 LAB — T4, FREE: Free T4: 1.65 ng/dL (ref 0.82–1.77)

## 2023-10-22 DIAGNOSIS — N2581 Secondary hyperparathyroidism of renal origin: Secondary | ICD-10-CM | POA: Diagnosis not present

## 2023-10-22 DIAGNOSIS — N186 End stage renal disease: Secondary | ICD-10-CM | POA: Diagnosis not present

## 2023-10-22 DIAGNOSIS — D631 Anemia in chronic kidney disease: Secondary | ICD-10-CM | POA: Diagnosis not present

## 2023-10-22 DIAGNOSIS — D509 Iron deficiency anemia, unspecified: Secondary | ICD-10-CM | POA: Diagnosis not present

## 2023-10-24 DIAGNOSIS — N186 End stage renal disease: Secondary | ICD-10-CM | POA: Diagnosis not present

## 2023-10-24 DIAGNOSIS — D631 Anemia in chronic kidney disease: Secondary | ICD-10-CM | POA: Diagnosis not present

## 2023-10-24 DIAGNOSIS — D509 Iron deficiency anemia, unspecified: Secondary | ICD-10-CM | POA: Diagnosis not present

## 2023-10-24 DIAGNOSIS — N2581 Secondary hyperparathyroidism of renal origin: Secondary | ICD-10-CM | POA: Diagnosis not present

## 2023-10-27 DIAGNOSIS — Z992 Dependence on renal dialysis: Secondary | ICD-10-CM | POA: Diagnosis not present

## 2023-10-27 DIAGNOSIS — N2581 Secondary hyperparathyroidism of renal origin: Secondary | ICD-10-CM | POA: Diagnosis not present

## 2023-10-27 DIAGNOSIS — D689 Coagulation defect, unspecified: Secondary | ICD-10-CM | POA: Diagnosis not present

## 2023-10-27 DIAGNOSIS — N186 End stage renal disease: Secondary | ICD-10-CM | POA: Diagnosis not present

## 2023-10-31 DIAGNOSIS — N186 End stage renal disease: Secondary | ICD-10-CM | POA: Diagnosis not present

## 2023-10-31 DIAGNOSIS — D509 Iron deficiency anemia, unspecified: Secondary | ICD-10-CM | POA: Diagnosis not present

## 2023-10-31 DIAGNOSIS — D631 Anemia in chronic kidney disease: Secondary | ICD-10-CM | POA: Diagnosis not present

## 2023-10-31 DIAGNOSIS — N2581 Secondary hyperparathyroidism of renal origin: Secondary | ICD-10-CM | POA: Diagnosis not present

## 2023-11-02 DIAGNOSIS — I10 Essential (primary) hypertension: Secondary | ICD-10-CM | POA: Diagnosis not present

## 2023-11-02 DIAGNOSIS — I2510 Atherosclerotic heart disease of native coronary artery without angina pectoris: Secondary | ICD-10-CM | POA: Diagnosis not present

## 2023-11-02 DIAGNOSIS — E785 Hyperlipidemia, unspecified: Secondary | ICD-10-CM | POA: Diagnosis not present

## 2023-11-02 DIAGNOSIS — Z992 Dependence on renal dialysis: Secondary | ICD-10-CM | POA: Diagnosis not present

## 2023-11-02 DIAGNOSIS — N186 End stage renal disease: Secondary | ICD-10-CM | POA: Diagnosis not present

## 2023-11-02 DIAGNOSIS — Z951 Presence of aortocoronary bypass graft: Secondary | ICD-10-CM | POA: Diagnosis not present

## 2023-11-03 DIAGNOSIS — D631 Anemia in chronic kidney disease: Secondary | ICD-10-CM | POA: Diagnosis not present

## 2023-11-03 DIAGNOSIS — D509 Iron deficiency anemia, unspecified: Secondary | ICD-10-CM | POA: Diagnosis not present

## 2023-11-03 DIAGNOSIS — N2581 Secondary hyperparathyroidism of renal origin: Secondary | ICD-10-CM | POA: Diagnosis not present

## 2023-11-03 DIAGNOSIS — N186 End stage renal disease: Secondary | ICD-10-CM | POA: Diagnosis not present

## 2023-11-05 DIAGNOSIS — N2581 Secondary hyperparathyroidism of renal origin: Secondary | ICD-10-CM | POA: Diagnosis not present

## 2023-11-05 DIAGNOSIS — D509 Iron deficiency anemia, unspecified: Secondary | ICD-10-CM | POA: Diagnosis not present

## 2023-11-05 DIAGNOSIS — D631 Anemia in chronic kidney disease: Secondary | ICD-10-CM | POA: Diagnosis not present

## 2023-11-05 DIAGNOSIS — N186 End stage renal disease: Secondary | ICD-10-CM | POA: Diagnosis not present

## 2023-11-07 DIAGNOSIS — D631 Anemia in chronic kidney disease: Secondary | ICD-10-CM | POA: Diagnosis not present

## 2023-11-07 DIAGNOSIS — N186 End stage renal disease: Secondary | ICD-10-CM | POA: Diagnosis not present

## 2023-11-07 DIAGNOSIS — N2581 Secondary hyperparathyroidism of renal origin: Secondary | ICD-10-CM | POA: Diagnosis not present

## 2023-11-07 DIAGNOSIS — D509 Iron deficiency anemia, unspecified: Secondary | ICD-10-CM | POA: Diagnosis not present

## 2023-11-09 DIAGNOSIS — D509 Iron deficiency anemia, unspecified: Secondary | ICD-10-CM | POA: Diagnosis not present

## 2023-11-09 DIAGNOSIS — N2581 Secondary hyperparathyroidism of renal origin: Secondary | ICD-10-CM | POA: Diagnosis not present

## 2023-11-09 DIAGNOSIS — D631 Anemia in chronic kidney disease: Secondary | ICD-10-CM | POA: Diagnosis not present

## 2023-11-09 DIAGNOSIS — N186 End stage renal disease: Secondary | ICD-10-CM | POA: Diagnosis not present

## 2023-11-10 DIAGNOSIS — Z992 Dependence on renal dialysis: Secondary | ICD-10-CM | POA: Diagnosis not present

## 2023-11-10 DIAGNOSIS — N186 End stage renal disease: Secondary | ICD-10-CM | POA: Diagnosis not present

## 2023-11-11 ENCOUNTER — Other Ambulatory Visit: Payer: Self-pay | Admitting: *Deleted

## 2023-11-11 DIAGNOSIS — E1122 Type 2 diabetes mellitus with diabetic chronic kidney disease: Secondary | ICD-10-CM

## 2023-11-11 DIAGNOSIS — Z794 Long term (current) use of insulin: Secondary | ICD-10-CM

## 2023-11-11 MED ORDER — "INSULIN SYRINGE-NEEDLE U-100 31G X 5/16"" 0.3 ML MISC": 3 refills | Status: DC

## 2023-11-26 DIAGNOSIS — E113313 Type 2 diabetes mellitus with moderate nonproliferative diabetic retinopathy with macular edema, bilateral: Secondary | ICD-10-CM | POA: Diagnosis not present

## 2023-11-26 DIAGNOSIS — H43823 Vitreomacular adhesion, bilateral: Secondary | ICD-10-CM | POA: Diagnosis not present

## 2023-11-26 DIAGNOSIS — Z961 Presence of intraocular lens: Secondary | ICD-10-CM | POA: Diagnosis not present

## 2023-11-26 DIAGNOSIS — H35033 Hypertensive retinopathy, bilateral: Secondary | ICD-10-CM | POA: Diagnosis not present

## 2023-12-09 ENCOUNTER — Ambulatory Visit: Admitting: Nurse Practitioner

## 2023-12-25 ENCOUNTER — Telehealth: Payer: Self-pay | Admitting: Family Medicine

## 2023-12-25 DIAGNOSIS — E039 Hypothyroidism, unspecified: Secondary | ICD-10-CM

## 2023-12-25 NOTE — Telephone Encounter (Signed)
 We do not prescribe this medication will route to provider who sent in

## 2023-12-25 NOTE — Telephone Encounter (Signed)
 Copied from CRM #[id number]. Topic: Clinical - Medication Refill >> Dec 25, 2023  3:14 PM Harlene ORN wrote: Medication: levothyroxine  (SYNTHROID ) 50 MCG tablet  Has the patient contacted their pharmacy? Yes (Agent: If no, request that the patient contact the pharmacy for the refill. If patient does not wish to contact the pharmacy document the reason why and proceed with request.) (Agent: If yes, when and what did the pharmacy advise?)  This is the patient's preferred pharmacy:   [phone] [place] [place] - [address] [place] [place] [address] Phone: [phone] Fax: [phone]  Is this the correct pharmacy for this prescription? Yes If no, delete pharmacy and type the correct one.   Has the prescription been filled recently? No  Is the patient out of the medication? Yes  Has the patient been seen for an appointment in the last year OR does the patient have an upcoming appointment? Yes  Can we respond through MyChart? No  Agent: Please be advised that Rx refills may take up to 3 business days. We ask that you follow-up with your pharmacy.

## 2023-12-28 MED ORDER — LEVOTHYROXINE SODIUM 50 MCG PO TABS: 50.0000 ug | ORAL_TABLET | Freq: Every day | ORAL | 3 refills | Status: AC

## 2023-12-28 NOTE — Telephone Encounter (Signed)
 Thanks for routing to me, I will take care of it.

## 2024-01-13 ENCOUNTER — Telehealth: Payer: Self-pay | Admitting: Pharmacist

## 2024-01-13 DIAGNOSIS — E1159 Type 2 diabetes mellitus with other circulatory complications: Secondary | ICD-10-CM

## 2024-01-13 DIAGNOSIS — Z794 Long term (current) use of insulin: Secondary | ICD-10-CM

## 2024-01-28 ENCOUNTER — Other Ambulatory Visit: Payer: Self-pay | Admitting: *Deleted

## 2024-01-28 ENCOUNTER — Telehealth: Payer: Self-pay | Admitting: Nurse Practitioner

## 2024-01-28 DIAGNOSIS — Z794 Long term (current) use of insulin: Secondary | ICD-10-CM

## 2024-01-28 DIAGNOSIS — E782 Mixed hyperlipidemia: Secondary | ICD-10-CM

## 2024-01-28 DIAGNOSIS — E039 Hypothyroidism, unspecified: Secondary | ICD-10-CM

## 2024-01-28 DIAGNOSIS — I10 Essential (primary) hypertension: Secondary | ICD-10-CM

## 2024-01-28 DIAGNOSIS — E1122 Type 2 diabetes mellitus with diabetic chronic kidney disease: Secondary | ICD-10-CM

## 2024-01-28 DIAGNOSIS — E559 Vitamin D deficiency, unspecified: Secondary | ICD-10-CM

## 2024-01-28 NOTE — Telephone Encounter (Signed)
 This pt has an appt with [facility] on Tuesday, however he needs labs placed. Can you place the thyroid  labs

## 2024-01-28 NOTE — Telephone Encounter (Signed)
 Labs have been placed

## 2024-02-02 ENCOUNTER — Ambulatory Visit (INDEPENDENT_AMBULATORY_CARE_PROVIDER_SITE_OTHER): Admitting: Nurse Practitioner

## 2024-02-02 ENCOUNTER — Encounter: Payer: Self-pay | Admitting: Nurse Practitioner

## 2024-02-02 VITALS — BP 100/62 | HR 82 | Ht 69.0 in | Wt 172.6 lb

## 2024-02-02 DIAGNOSIS — E1122 Type 2 diabetes mellitus with diabetic chronic kidney disease: Secondary | ICD-10-CM

## 2024-02-02 DIAGNOSIS — E782 Mixed hyperlipidemia: Secondary | ICD-10-CM | POA: Diagnosis not present

## 2024-02-02 DIAGNOSIS — N184 Chronic kidney disease, stage 4 (severe): Secondary | ICD-10-CM | POA: Diagnosis not present

## 2024-02-02 DIAGNOSIS — E039 Hypothyroidism, unspecified: Secondary | ICD-10-CM | POA: Diagnosis not present

## 2024-02-02 DIAGNOSIS — E559 Vitamin D deficiency, unspecified: Secondary | ICD-10-CM | POA: Diagnosis not present

## 2024-02-02 DIAGNOSIS — I10 Essential (primary) hypertension: Secondary | ICD-10-CM | POA: Diagnosis not present

## 2024-02-02 DIAGNOSIS — Z794 Long term (current) use of insulin: Secondary | ICD-10-CM | POA: Diagnosis not present

## 2024-02-02 LAB — POCT GLYCOSYLATED HEMOGLOBIN (HGB A1C): Hemoglobin A1C: 7.7 % — AB (ref 4.0–5.6)

## 2024-02-02 NOTE — Progress Notes (Signed)
 "                          02/02/2024, 4:12 PM  Endocrinology follow-up note   Subjective:    Patient ID: Anthony French, male    DOB: [birth year].  Anthony French is being seen in follow-up after he was seen in consultation for management of currently uncontrolled symptomatic diabetes requested by  Anthony Norene HERO, DO.   Past Medical History:  Diagnosis Date   Diabetes (HCC)    Diabetic neuropathy (HCC)    Essential hypertension, benign 06/01/2019   Hyperlipidemia     Past Surgical History:  Procedure Laterality Date   APPENDECTOMY  1980   AV FISTULA PLACEMENT Left 12/09/2022   Procedure: LEFT ARM BRACHIOCEPHALIC ARTERIOVENOUS (AV) FISTULA CREATION;  Surgeon: Lanis Fonda BRAVO, MD;  Location: [facility] OR;  Service: Vascular;  Laterality: Left;   CATARACT EXTRACTION Left 10/14/2023   CYST REMOVAL TRUNK      Social History   Socioeconomic History   Marital status: Single    Spouse name: Not on file   Number of children: 3   Years of education: Not on file   Highest education level: Not on file  Occupational History   Occupation: retired  Tobacco Use   Smoking status: Never   Smokeless tobacco: Never  Vaping Use   Vaping status: Never Used  Substance and Sexual Activity   Alcohol use: Not Currently   Drug use: Never   Sexual activity: Not Currently  Other Topics Concern   Not on file  Social History Narrative   Not on file   Social Drivers of Health   Tobacco Use: Low Risk (02/02/2024)   Patient History    Smoking Tobacco Use: Never    Smokeless Tobacco Use: Never    Passive Exposure: Not on file  Financial Resource Strain: Low Risk (08/19/2023)   Received from [facility]   Overall Financial Resource Strain (CARDIA)    Difficulty of Paying Living Expenses: Not very hard  Food Insecurity: No Food Insecurity (09/25/2023)   Received from [facility]   Epic    Within the past 12 months, you worried that your food would run out before you got the money to buy  more.: Never true    Within the past 12 months, the food you bought just didn't last and you didn't have money to get more.: Never true  Transportation Needs: No Transportation Needs (09/25/2023)   Received from [facility]    In the past 12 months, has lack of transportation kept you from medical appointments or from getting medications?: No    In the past 12 months, has lack of transportation kept you from meetings, work, or from getting things needed for daily living?: No  Physical Activity: Insufficiently Active (06/26/2023)   Exercise Vital Sign    Days of Exercise per Week: 3 days    Minutes of Exercise per Session: 10 min  Stress: No Stress Concern Present (12/18/2023)   Received from [facility] - Occupational Stress Questionnaire    Do you feel stress - tense, restless, nervous, or anxious, or unable to sleep at night because your mind is troubled all the time - these days?: Not at all  Social Connections: Moderately Integrated (06/26/2023)   Social Connection and Isolation Panel    Frequency of Communication with Friends and Family: Twice a week    Frequency  of Social Gatherings with Friends and Family: Twice a week    Attends Religious Services: More than 4 times per year    Active Member of [employer] or Organizations: Yes    Attends Banker Meetings: More than 4 times per year    Marital Status: Widowed  Depression (PHQ2-9): Low Risk (10/19/2023)   Depression (PHQ2-9)    PHQ-2 Score: 0  Alcohol Screen: Low Risk (06/26/2023)   Alcohol Screen    Last Alcohol Screening Score (AUDIT): 0  Housing: Low Risk (09/25/2023)   Received from [facility]    In the last 12 months, was there a time when you were not able to pay the mortgage or rent on time?: No    In the past 12 months, how many times have you moved where you were living?: 0    At any time in the past 12 months, were you homeless or living in a shelter  (including now)?: No  Utilities: Not At Risk (09/25/2023)   Received from [facility]    In the past 12 months has the electric, gas, oil, or water company threatened to shut off services in your home?: No  Health Literacy: Adequate Health Literacy (06/26/2023)   B1300 Health Literacy    Frequency of need for help with medical instructions: Never    Family History  Problem Relation Age of Onset   Stroke Mother    Diabetes Mother    Hypertension Mother    Hyperlipidemia Mother    Kidney disease Mother    COPD Father    Diabetes Sister    Stroke Brother    Diabetes Brother    Arthritis Maternal Grandmother     Outpatient Encounter Medications as of 02/02/2024  Medication Sig   aspirin EC 325 MG tablet Take 325 mg by mouth daily.   atorvastatin  (LIPITOR) 80 MG tablet Take 80 mg by mouth at bedtime.   blood glucose meter kit and supplies Dispense based on patient and insurance preference. Use up to four times daily as directed. (FOR ICD-10 E10.9, E11.9). Check blood sugar twice a day as directed.   Continuous Glucose Receiver (FREESTYLE LIBRE 3 READER) DEVI 1 each by Does not apply route in the morning, at noon, in the evening, and at bedtime. Use to check BGs E11.9   Continuous Glucose Sensor (FREESTYLE LIBRE 3 SENSOR) MISC PLACE 1 SENSOR ON THE SKIN EVERY 14 DAYS. USE TO CHECK GLUCOSE CONTINUOUSLY E11.9   famotidine  (PEPCID  AC) 10 MG tablet Take 1 tablet (10 mg total) by mouth every other day. **dose change for kidneys   glucose blood (ACCU-CHEK GUIDE) test strip Test blood sugar 4 times per day, before meals and at bedtime.   hydrALAZINE  (APRESOLINE ) 25 MG tablet Take 75 mg by mouth in the morning and at bedtime.   insulin  NPH-regular Human (70-30) 100 UNIT/ML injection Inject 15-20 Units into the skin See admin instructions. Inject 20 units before breakfast and 15 units before supper  06/26/23 per pt inj 10 before breakfast and 5 units before supper per pt (Patient taking  differently: Inject 5-10 Units into the skin See admin instructions. Inject 20 units before breakfast and 15 units before supper  06/26/23 per pt inj 10 before breakfast and 5 units before supper per pt)   Insulin  Pen Needle (PEN NEEDLES) 31G X 8 MM MISC 1 each by Does not apply route in the morning and at bedtime.   Insulin  Syringe-Needle U-100 (  BD INSULIN  SYRINGE U/F) 31G X 5/16 0.3 ML MISC USE WITH INSULIN  TWICE  DAILY DX E11.22   levothyroxine  (SYNTHROID ) 50 MCG tablet Take 1 tablet (50 mcg total) by mouth daily before breakfast.   metoprolol  tartrate (LOPRESSOR ) 25 MG tablet Take 0.5 tablets (12.5 mg total) by mouth 2 (two) times daily.   NIFEdipine (ADALAT CC) 90 MG 24 hr tablet Take 90 mg by mouth every morning.   [DISCONTINUED] amiodarone (PACERONE) 200 MG tablet Take 200 mg by mouth daily. (Patient not taking: Reported on 02/02/2024)   [DISCONTINUED] chlorthalidone (HYGROTON) 25 MG tablet Take 12.5 mg by mouth daily. (Patient not taking: Reported on 02/02/2024)   [DISCONTINUED] Multiple Vitamin (MULTIVITAMIN WITH MINERALS) TABS tablet Take 1 tablet by mouth daily. (Patient not taking: Reported on 02/02/2024)   [DISCONTINUED] rizatriptan  (MAXALT -MLT) 10 MG disintegrating tablet TAKE 1 TABLET BY MOUTH AS NEEDED FOR MIGRAINE. MAY REPEAT IN 2 HOURS IF NEEDED (Patient not taking: Reported on 02/02/2024)   [DISCONTINUED] sodium bicarbonate 650 MG tablet Take 650 mg by mouth 3 (three) times daily. (Patient not taking: Reported on 02/02/2024)   [DISCONTINUED] sodium zirconium cyclosilicate (LOKELMA) 10 g PACK packet Take 10 g by mouth daily. (Patient not taking: Reported on 02/02/2024)   No facility-administered encounter medications on file as of 02/02/2024.    ALLERGIES: Allergies  Allergen Reactions   Other Hives    Pt states that he is allergic to an ABT but he can not remember what it is   Sulfa  Antibiotics Hives    VACCINATION STATUS: Immunization History  Administered Date(s)  Administered   Fluad Quad(high Dose 65+) 11/02/2019, 12/27/2020   Fluad Trivalent(High Dose 65+) 11/19/2022   INFLUENZA, HIGH DOSE SEASONAL PF 10/19/2023   Influenza Inj Mdck Quad With Preservative 11/26/2015   Influenza,inj,Quad PF,6+ Mos 12/25/2016   Pneumococcal Conjugate-13 12/27/2018   Pneumococcal Polysaccharide-23 04/16/2017   Tdap 08/19/2017   Zoster Recombinant(Shingrix) 07/28/2019, 09/07/2020    Diabetes He presents for his follow-up diabetic visit. He has type 2 diabetes mellitus. Onset time: He was diagnosed at approximate age of 48 years. His disease course has been stable. There are no hypoglycemic associated symptoms. Pertinent negatives for hypoglycemia include no nervousness/anxiousness, pallor or tremors. Pertinent negatives for diabetes include no fatigue, no polydipsia, no polyphagia, no polyuria and no weight loss. There are no hypoglycemic complications. Symptoms are stable. Diabetic complications include nephropathy and peripheral neuropathy. Risk factors for coronary artery disease include dyslipidemia, diabetes mellitus, hypertension, male sex, sedentary lifestyle and family history. Current diabetic treatment includes insulin  injections. He is compliant with treatment most of the time. His weight is fluctuating minimally. He is following a generally healthy diet. When asked about meal planning, he reported none. He has had a previous visit with a dietitian. He never participates in exercise. His home blood glucose trend is fluctuating minimally. His overall blood glucose range is 140-180 mg/dl. (He presents today with no readings, accidentally forgot his meter at home.  He notes his glucose in the morning is running around 130-160 and evening glucose ranges between 80-140 typically (lower on dialysis days).  His POCT A1c today is 7.7%, increasing from last visit of 5.2%.  He had a heart attack since last visit, has had several revisions of his fistula.  He denies any  hypoglycemia.) An ACE inhibitor/angiotensin II receptor blocker is being taken. He does not see a podiatrist.Eye exam is current.  Thyroid  Problem Presents for follow-up (Abnormality noted with recent thyroid  studies.  No prior work-up done,  no previous labs to compare to.) visit. Patient reports no anxiety, cold intolerance, constipation, diarrhea, fatigue, heat intolerance, tremors, weight gain or weight loss. The symptoms have been stable.    Review of systems  Constitutional: + steadily decreasing body weight,  current Body mass index is 25.49 kg/m. , no fatigue, no subjective hyperthermia, no subjective hypothermia Eyes: no blurry vision, no xerophthalmia ENT: no sore throat, no nodules palpated in throat, no dysphagia/odynophagia, no hoarseness Cardiovascular: no chest pain, no shortness of breath, no palpitations, no leg swelling Respiratory: no cough, no shortness of breath Gastrointestinal: no nausea/vomiting/diarrhea Musculoskeletal: no muscle/joint aches Skin: no rashes, no hyperemia Neurological: no tremors, no numbness, no tingling, no dizziness Psychiatric: no depression, no anxiety   Objective:    BP 100/62 (BP Location: Right Arm, Patient Position: Sitting, Cuff Size: Large)   Pulse 82   Ht 5' 9 (1.753 m)   Wt 172 lb 9.6 oz (78.3 kg)   BMI 25.49 kg/m   Wt Readings from Last 3 Encounters:  02/02/24 172 lb 9.6 oz (78.3 kg)  10/19/23 179 lb 2 oz (81.3 kg)  08/27/23 182 lb (82.6 kg)     BP Readings from Last 3 Encounters:  02/02/24 100/62  10/19/23 (!) 143/71  08/19/23 (!) 158/74     Physical Exam- Limited  Constitutional:  Body mass index is 25.49 kg/m. , not in acute distress, normal state of mind Eyes:  EOMI, no exophthalmos Musculoskeletal: no gross deformities, strength intact in all four extremities, no gross restriction of joint movements Skin:  no rashes, no hyperemia Neurological: no tremor with outstretched hands    Diabetic Foot Exam -  Simple   No data filed     CMP ( most recent) CMP     Component Value Date/Time   NA 141 10/19/2023 1144   K 4.6 10/19/2023 1144   CL 102 10/19/2023 1144   CO2 21 10/19/2023 1144   GLUCOSE 206 (H) 10/19/2023 1144   GLUCOSE 134 (H) 12/09/2022 0640   BUN 30 (H) 10/19/2023 1144   CREATININE 4.28 (HH) 10/19/2023 1144   CALCIUM  9.2 10/19/2023 1144   PROT 6.1 10/19/2023 1144   ALBUMIN 4.2 10/19/2023 1144   AST 15 10/19/2023 1144   ALT 7 10/19/2023 1144   ALKPHOS 114 10/19/2023 1144   BILITOT 0.4 10/19/2023 1144   GFRNONAA 18 04/23/2022 1536   GFRAA 47 (L) 09/13/2019 1420    Diabetic Labs (most recent): Lab Results  Component Value Date   HGBA1C 7.7 (A) 02/02/2024   HGBA1C 5.2 10/19/2023   HGBA1C 6.2 (H) 06/29/2023   MICROALBUR 150 08/20/2020   MICROALBUR 237.3 09/13/2019     Lipid Panel ( most recent) Lipid Panel     Component Value Date/Time   CHOL 172 10/19/2023 1144   TRIG 182 (H) 10/19/2023 1144   HDL 38 (L) 10/19/2023 1144   CHOLHDL 4.5 10/19/2023 1144   LDLCALC 102 (H) 10/19/2023 1144   LABVLDL 32 10/19/2023 1144     Assessment & Plan:   1) Type 2 diabetes mellitus with stage 3b chronic kidney disease, with long-term current use of insulin  (HCC)  - Anthony French has currently uncontrolled symptomatic type 2 DM since  68 years of age.   Recent labs reviewed.  He presents today with no readings, accidentally forgot his meter at home.  He notes his glucose in the morning is running around 130-160 and evening glucose ranges between 80-140 typically (lower on dialysis days).  His POCT A1c today  is 7.7%, increasing from last visit of 5.2%.  He had a heart attack since last visit, has had several revisions of his fistula.  He denies any hypoglycemia.     - I had a long discussion with him about the progressive nature of diabetes and the pathology behind its complications. -his diabetes is complicated by CKD, peripheral neuropathy and he remains at a high risk for  more acute and chronic complications which include CAD, CVA, CKD, retinopathy, and neuropathy. These are all discussed in detail with him.  - Nutritional counseling repeated/built upon at each appointment.  - The patient admits there is a room for improvement in their diet and drink choices. -  Suggestion is made for the patient to avoid simple carbohydrates from their diet including Cakes, Sweet Desserts / Pastries, Ice Cream, Soda (diet and regular), Sweet Tea, Candies, Chips, Cookies, Sweet Pastries, Store Bought Juices, Alcohol in Excess of 1-2 drinks a day, Artificial Sweeteners, Coffee Creamer, and Sugar-free Products. This will help patient to have stable blood glucose profile and potentially avoid unintended weight gain.   - I encouraged the patient to switch to unprocessed or minimally processed complex starch and increased protein intake (animal or plant source), fruits, and vegetables.   - Patient is advised to stick to a routine mealtimes to eat 3 meals a day and avoid unnecessary snacks (to snack only to correct hypoglycemia).  - I have approached him with the following individualized plan to manage  his diabetes and patient agrees:   - he will continue to benefit from simplicity of premixed insulin .   -He is advised to continue his Novolin 70/30 10 units with breakfast and 5 units with supper if glucose is above 90 and he is eating.     -He is encouraged to continue using his CGM to monitor blood glucose 4 times per day, before meals and before bed, and call the clinic if he has readings less than 70 or greater than 300 for 3 tests in a row.  - Specific targets for  A1c;  LDL, HDL,  and Triglycerides were discussed with the patient.  2) Blood Pressure /Hypertension:  His blood pressure is controlled to target.  He is advised to continue meds as prescribed by nephrology/PCP.  3) Lipids/Hyperlipidemia:  His most recent lipid panel from 10/19/23 shows uncontrolled LDL of 102 and  slightly elevated triglycerides of 182.  He is advised to continue Atorvastatin  40 mg po daily at bedtime.  Side effects and precautions discussed with him.    4)  Weight/Diet:  His Body mass index is 25.49 kg/m.     he is a candidate for some weight loss. I discussed with him the fact that loss of 5 - 10% of his  current body weight will have the most impact on his diabetes management.  Exercise, and detailed carbohydrates information provided  -  detailed on discharge instructions.  5)Hypothyroidism-acquired: There are no recent TFTs to review.  He is advised to continue his Levothyroxine  50 mcg po daily before breakfast.  Will recheck TFTs prior to next visit and adjust dose accordingly.   - The correct intake of thyroid  hormone (Levothyroxine , Synthroid ), is on empty stomach first thing in the morning, with water, separated by at least 30 minutes from breakfast and other medications,  and separated by more than 4 hours from calcium , iron, multivitamins, acid reflux medications (PPIs).  - This medication is a life-long medication and will be needed to correct thyroid  hormone imbalances  for the rest of your life.  The dose may change from time to time, based on thyroid  blood work.  - It is extremely important to be consistent taking this medication, near the same time each morning.  -AVOID TAKING PRODUCTS CONTAINING BIOTIN (commonly found in Hair, Skin, Nails vitamins) AS IT INTERFERES WITH THE VALIDITY OF THYROID  FUNCTION BLOOD TESTS.  6) Chronic Care/Health Maintenance: -he is not on ARB (per nephrology) and is on Statin medications and is encouraged to initiate and continue to follow up with Ophthalmology, Dentist,  Podiatrist at least yearly or according to recommendations, and advised to stay away from smoking. I have recommended yearly flu vaccine and pneumonia vaccine at least every 5 years; moderate intensity exercise for up to 150 minutes weekly; and  sleep for at least 7 hours a  day.  - he is advised to maintain close follow up with Anthony Norene HERO, DO for primary care needs, as well as his other providers for optimal and coordinated care       I spent  44  minutes in the care of the patient today including review of labs from CMP, Lipids, Thyroid  Function, Hematology (current and previous including abstractions from other facilities); face-to-face time discussing  his blood glucose readings/logs, discussing hypoglycemia and hyperglycemia episodes and symptoms, medications doses, his options of short and long term treatment based on the latest standards of care / guidelines;  discussion about incorporating lifestyle medicine;  and documenting the encounter. Risk reduction counseling performed per USPSTF guidelines to reduce obesity and cardiovascular risk factors.     Please refer to Patient Instructions for Blood Glucose Monitoring and Insulin /Medications Dosing Guide  in media tab for additional information. Please  also refer to  Patient Self Inventory in the Media  tab for reviewed elements of pertinent patient history.  [name] participated in the discussions, expressed understanding, and voiced agreement with the above plans.  All questions were answered to his satisfaction. he is encouraged to contact clinic should he have any questions or concerns prior to his return visit.   Follow up plan: - Return in about 4 months (around 06/02/2024) for Diabetes F/U with A1c in office, Thyroid  follow up, Previsit labs, Bring meter and logs.  Benton Rio, [facility] [address] [place], [place] [address] Phone: [phone] Fax: [phone]   02/02/2024, 4:12 PM   "

## 2024-02-02 NOTE — Patient Instructions (Signed)

## 2024-02-03 ENCOUNTER — Telehealth: Payer: Self-pay

## 2024-02-03 NOTE — Progress Notes (Signed)
 Complex Care Management Note  Care Guide Note 02/03/2024 Name: Anthony French MRN: [id number] DOB: 1955/11/19  Anthony French is a 68 y.o. year old male who sees [name], DO for primary care. I reached out to [name] by phone today to offer complex care management services.  Anthony French was given information about Complex Care Management services today including:   The Complex Care Management services include support from the care team which includes your Nurse Care Manager, Clinical Social Worker, or Pharmacist.  The Complex Care Management team is here to help remove barriers to the health concerns and goals most important to you. Complex Care Management services are voluntary, and the patient may decline or stop services at any time by request to their care team member.   Complex Care Management Consent Status: Patient agreed to services and verbal consent obtained.   Follow up plan:  Telephone appointment with complex care management team member scheduled for:  03/03/2024  Encounter Outcome:  Patient Scheduled  Jeoffrey Buffalo , RMA     [place]  [facility], [facility] Guide  Direct Dial : [phone]  Website: [email or web address]

## 2024-02-03 NOTE — Progress Notes (Signed)
 Care Guide Pharmacy Note  02/03/2024 Name: Arlind Klingerman MRN: [id number] DOB: Jan 07, 1956  Referred By: [name], DO Reason for referral: Complex Care Management (Outreach to schedule with Pharm d and LCSW )   Keldan Eplin is a 68 y.o. year old male who is a primary care patient of [name], DO.  Stacey Maura was referred to the pharmacist for assistance related to: DMII  Successful contact was made with the patient to discuss pharmacy services including being ready for the pharmacist to call at least 5 minutes before the scheduled appointment time and to have medication bottles and any blood pressure readings ready for review. The patient agreed to meet with the pharmacist via telephone visit on (date/time).02/25/2024  Jeoffrey Buffalo , RMA     [place]  [facility], [facility] Guide  Direct Dial : [phone]  Website: [email or web address]

## 2024-02-23 NOTE — Progress Notes (Signed)
 Nephrology - ESRD Return Visit  Trigo Winterbottom DOB: 18-Nov-1955 Visit Date: 02/23/2024   HISTORY OF PRESENT ILLNESS: Anthony French is a 69 y.o. male with past medical history of ESRD due to longstanding CKD stage 5 from hypertension and diabetes, previously followed by Dr. Rachele in [place], who was admitted 07/10/23 for NSTEMI and hypertensive urgency, ultimately undergoing 3-vessel CABG on 6/9 following LHC showing multivessel disease. Post-op course was notable for conversion of Afib with RVR back to sinus rhythm on amiodarone, and resolution of a probable LLL pneumonia after antibiotics. He became essentially anuric post-CABG and initiated intermittent hemodialysis (HD) via a left upper extremity AVF (created 11/2022) with transition to TTS schedule; he tolerated HD well with good hemodynamics. Fistulagram on 6/17 showed multiple side branches without central stenosis, and vascular surgery may consider ligation if cannulation remains difficult. Anemia of CKD is being managed with IV iron and ESA during HD. Patient and wife have voiced concerns about home modalities; outpatient HD was arranged in [place], [place] starting 08/04/23..   The patient returns today for nephrology follow-up for ESRD.  The patient is accompanied by his family member.   Dialyzing at  king TTS, recently had increase in EDW after being hypotensive and no signs of fluid overload. He reports eating better with weight gain. His BP has been low in HD, even at start of treatments. He is holding hydralazine  prior to treatments and continues metoprolol  25mg  BID. This afternoon in office feels well but BP 104/58. I have asked him to stop the hydralazine   Interdialytic weight gains minimal,  we reviewed his monthly labs. He just received his phosphorus binder, so this should improve.  He reports no dyspnea, chest pain or orthopnea  He has good appetite and energy levels.  [facility].     ALLERGIES: Allergies[1]    MEDICATIONS:  Current Home Medications  Medication Sig  aspirin (ECOTRIN LOW DOSE) EC tablet Take one tablet (81 mg dose) by mouth daily.  aspirin (ECOTRIN) 325 mg EC tablet Take one tablet (325 mg dose) by mouth daily.  atorvastatin  (LIPITOR) 80 mg tablet Take one tablet (80 mg dose) by mouth at bedtime.  BD INSULIN  SYRINGE U/F 31G X 5/16 0.3 ML MISC USE WITH INSULIN  TWICE A DAY  calcitRIOL (ROCALTROL) 0.25 mcg capsule Take one capsule (0.25 mcg dose) by mouth 3 (three) times a week. MONDAY-WEDNESDAY-FRIDAY  Continuous Glucose Sensor (FREESTYLE LIBRE 3 PLUS SENSOR) MISC   famotidine  (PEPCID ) 20 mg tablet Take one tablet (20 mg dose) by mouth daily.  insulin  NPH-insulin  regular (NOVOLIN 70/30) (70-30) 100 UNIT/ML injection Inject ten Units into the skin with breakfast.  insulin  NPH-insulin  regular (NOVOLIN 70/30) (70-30) 100 UNIT/ML injection Inject five Units into the skin every evening.  levothyroxine  sodium (SYNTHROID ,LEVOTHROID,LOVOXYL) 50 mcg tablet Take one tablet (50 mcg dose) by mouth daily at 6 (six) am.  metoprolol  tartrate (LOPRESSOR ) 25 mg tablet Take one tablet (25 mg dose) by mouth 2 (two) times daily.  rizatriptan  (MAXALT -MLT) 10 MG disintegrating tablet Take one tablet (10 mg dose) by mouth as needed for Migraine.     PHYSICAL EXAMINATION:  Blood pressure (!) 106/58, height 5' 9 (1.753 m), weight 180 lb (81.6 kg). Body mass index is 26.58 kg/m. GENERAL - Well appearing elderly male, in no acute distress. HEENT - Nose and oropharynx are clear without erythema or exudate. Mucous membranes are moist. EYES - Conjunctivae pink, sclerae anicteric.   NECK - Supple,  without JVD.   PULMONARY - Clear to auscultation bilaterally. No wheezes/rhonchi/rales.   CARDIOVASCULAR - Regular rate and rhythm, normal S1/S2 heart sounds.  No murmurs or rubs.  No pitting pretibial edema bilaterally.  GASTROINTESTINAL / BACK - The abdomen is soft,  non-tender, non-distended.  Bowel sounds are normal.    INTEGUMENTARY - No jaundice.  No rash on exposed areas. MUSCULOSKELETAL - No joint deformities or synovitis. NEUROLOGIC - There are no focal motor deficits.  Cranial nerves are grossly intact.   PSYCHIATRIC - Normal eye contact and language, affect is appropriate. ACCESS: Left upper extremity AV fistula with bruit and thrill   LABS: Lab Results  Component Value Date/Time   Sodium External 137 02/17/2024 12:00 AM   Lab Results  Component Value Date/Time   Potassium Blood External 5.1 02/17/2024 12:00 AM   Lab Results  Component Value Date/Time   Chloride Blood External 99 02/17/2024 12:00 AM   Lab Results  Component Value Date/Time   CO2 External 24 02/17/2024 12:00 AM   Lab Results  Component Value Date/Time   Glucose Blood Venous External 192 02/17/2024 12:00 AM   Lab Results  Component Value Date/Time   Bun Blood External 59 02/17/2024 12:00 AM   Lab Results  Component Value Date/Time   Creatinine External 8.71 02/17/2024 12:00 AM   Lab Results  Component Value Date/Time   eGFR 6 (L) 12/18/2023 07:45 AM   Lab Results  Component Value Date/Time   T Bili 0.5 09/27/2023 03:51 AM   Lab Results  Component Value Date/Time   Alkaline Phos External 94 02/17/2024 12:00 AM   Lab Results  Component Value Date/Time   Sgot (Ast) External 11 02/17/2024 12:00 AM   Lab Results  Component Value Date/Time   ALT External 9 02/17/2024 12:00 AM   Lab Results  Component Value Date/Time   Albumin External 4.1 02/17/2024 12:00 AM   Lab Results  Component Value Date/Time   Hemoglobin A1c 5.8 (H) 07/10/2023 04:34 PM   Lab Results  Component Value Date/Time   Calcium  External 8.0 02/17/2024 12:00 AM   Lab Results  Component Value Date/Time   Phos 3.3 07/21/2023 01:54 PM   No results found for: PARATHYHORM No results found for: VITD25 Lab Results  Component Value Date/Time   WBC 4.7 12/18/2023 07:45 AM    Lab Results  Component Value Date/Time   Hemoglobin Ext 11.1 02/17/2024 12:00 AM   Lab Results  Component Value Date/Time   Platelets Ext 148 02/17/2024 12:00 AM   Lab Results  Component Value Date/Time   Ferritin 673.0 (H) 07/14/2023 01:09 AM   No results found for: SAT Lab Results  Component Value Date/Time   A/C RATIO UR, POC  05/07/2016 05:03 PM     Comment:     not calculated by instrument   No results found for: CREATPROT    Assessment & Plan:  # ESRD - The patient's ESRD is felt most likely secondary to hypertension/diabetes   - the patient is on hemodialysis TTS at [facility] dialysis center  - dialysis adequacy: Patient's Kt/V of 1.22 is currently at target. Continue current dialysis prescription. - dialysis access: Left upper AV fistula Functioning well, no evidence of infection. Continue regular monitoring at the outpatient dialysis center.  We will cont to use heparin  per protocol to avoid clotting. Had fistulogram 07/2023 due to infiltration/difficult access, no central stenosis, large collaterals which could be ligated by vascular if there are ongoing access concerns.  - transplant candidacy: referral  made 08/2023 and pending evaluation  - nutrition: Albumin >3.5. Continue to see dietician as needed at the dialysis unit - Bone and Mineral Metabolism: Secondary hyperparathyroidism and hyperphosphatemia.  This is being managed via the phosphate binder, vitamin D  analog, and calcimimetic protocols at outpatient dialysis.  I reviewed the importance of compliance with binders and the low phosphorus diet. - Anemia of CKD:  This is being managed via the ESA and iron protocols at outpatient dialysis. - Electrolytes/acid base: Acceptable - goals of care: Full Code - I reviewed the monthly dialysis labs with the patient today. I reviewed the patient's medication list for appropriate dosing based on HD/ESRD with changes as follows: N/A    # blood pressure / volume  management BP Readings from Last 3 Encounters:  02/23/24 (!) 106/58  12/18/23 137/63  11/02/23 124/62   - EDW: 83kg. Cont to monitor BP closely at the dialysis unit   # health maintenance   - immunizations to be given at hemodialysis unit  # Disposition:   Follow up in about 6 months (around 08/22/2024). or sooner PRN for symptoms or interim labs   Future Appointments  Date Time Provider Department Center  03/08/2024 11:00 AM Redell DELENA Montenegro, MD [facility] None  08/25/2024  2:15 PM Fonda LELON Minister, DO [facility]    Patient's Medications  New Prescriptions   No medications on file  Discontinued Medications   HYDRALAZINE  HCL (APRESOLINE ) 100 MG TABLET    Take one tablet (100 mg dose) by mouth 3 (three) times a day.   NIFEDIPINE (ADALAT CC) 90 MG 24 HR TABLET    Take one tablet (90 mg dose) by mouth daily.      I discussed the care plan as well as risks/benefits of the proposed therapies, and the patient/caregiver demonstrated understanding of the plan.  Please be aware that the above note was dictated using voice recognition software. Incorrect transcription may occur inadvertently and not be corrected on review.     No orders of the defined types were placed in this encounter.    Electronically signed by: FONDA LELON MINISTER, DO 02/23/2024 4:22 PM       [1] Allergies Allergen Reactions   Sulfa  Antibiotics Hives   Unknown [Other] Hives    Pt states that he is allergic to an ABT but he can not remember what it is

## 2024-02-25 ENCOUNTER — Other Ambulatory Visit

## 2024-02-25 DIAGNOSIS — Z992 Dependence on renal dialysis: Secondary | ICD-10-CM

## 2024-02-25 DIAGNOSIS — E119 Type 2 diabetes mellitus without complications: Secondary | ICD-10-CM

## 2024-02-25 DIAGNOSIS — N186 End stage renal disease: Secondary | ICD-10-CM

## 2024-02-25 DIAGNOSIS — Z794 Long term (current) use of insulin: Secondary | ICD-10-CM

## 2024-02-25 NOTE — Progress Notes (Signed)
 "  02/25/2024 Name: Anthony French MRN: [id number] DOB: Jun 14, 1955  Chief Complaint  Patient presents with   Diabetes    Anthony French is a 69 y.o. year old male who presented for a telephone visit.  I connected with  Anthony French on 02/25/24 by telephone and verified that I am speaking with the correct person using two identifiers. I discussed the limitations of evaluation and management by telemedicine. The patient expressed understanding and agreed to proceed.  Patient was located in her home and PharmD in PCP office during this visit.   They were referred to the pharmacist by their PCP for assistance in managing diabetes and complex medication management.    Subjective:  Patient ESRD on HD MWF now (previously TTS).  Currently seeing Endo for T2DM management, however A1c has increased.  He reports his is doing well overall.  He continues to use Libre 3 PLUS CGM to monitor blood sugars.  He follows with [facility] in [facility]  Care Team: Primary Care Provider: [name], DO ; Next Scheduled Visit: 04/18/24 (needs to reschedule, message sent to RN) Endocrinologist Anthony French; Next Scheduled Visit: 05/2024  Medication Access/Adherence  Current Pharmacy:  [facility] - [place], [place] - [address] [facility] [address] [place] [address] [place] [address] Phone: [phone] Fax: [phone]  [id number] - [address] [place], [place] - [address] [place] [address] Phone: [phone] Fax: [phone]  Patient reports affordability concerns with their medications: No  Patient reports access/transportation concerns to their pharmacy: No  Patient reports adherence concerns with their medications:  No     Diabetes:  Current medications: Novolog  mix 70/30 per endo (10 units with breakfast, 5 with supper) Medications tried in the past: glipizide , metformin , Novolog   Current glucose readings:   Libre 3 PLUS CGM with  reader Date of Download: 02/25/24 Average Glucose: 184 mg/dL for 7 days Glucose Management Indicator: n/a  Time in Goal:  - Time in range 70-180: 57% - Time above range: 43% - Time below range: 0% Observed patterns: some lows reported post HD  Patient denies hypoglycemic s/sx including dizziness, shakiness, sweating. Patient denies hyperglycemic symptoms including polyuria, polydipsia, polyphagia, nocturia, neuropathy, blurred vision.  Current meal patterns: 2 meals a day - Breakfast: eggs, sausage, cheese - Supper varies - Drinks water, sweet tea, 1 diet soda (zero) sprite  Current physical activity: limited   Current medication access support: [employer] medicare  Macrovascular and Microvascular Risk Reduction:  Statin? yes (atorvastatin  80mg  s/p NSTEMI); ACEi/ARB? no; therapy not indicated  Last urinary albumin/creatinine ratio:  Lab Results  Component Value Date   MICRALBCREAT 30-300 08/20/2020   MICRALBCREAT 149 (H) 09/13/2019   MICRALBCREAT 14.9 04/16/2017   Last eye exam:  Lab Results  Component Value Date   HMDIABEYEEXA Retinopathy (A) 10/07/2023   Last foot exam: 10/19/2023 Tobacco Use:  Tobacco Use: Low Risk (02/23/2024)   Received from [facility]   Patient History    Smoking Tobacco Use: Never    Smokeless Tobacco Use: Never    Passive Exposure: Never   Objective:  Lab Results  Component Value Date   HGBA1C 7.7 (A) 02/02/2024    Lab Results  Component Value Date   CREATININE 4.28 (HH) 10/19/2023   BUN 30 (H) 10/19/2023   NA 141 10/19/2023   K 4.6 10/19/2023   CL 102 10/19/2023   CO2 21 10/19/2023    Lab Results  Component Value  Date   CHOL 172 10/19/2023   HDL 38 (L) 10/19/2023   LDLCALC 102 (H) 10/19/2023   TRIG 182 (H) 10/19/2023   CHOLHDL 4.5 10/19/2023    Medications Reviewed Today     Reviewed by Anthony French, RPH-CPP (Pharmacist) on 02/25/24 at 1102  Med List Status: <None>   Medication Order Taking? Sig Documenting Provider  Last Dose Status Informant    Discontinued 02/25/24 1101 (Change in therapy)   aspirin EC 81 MG tablet [id number] Yes Take 81 mg by mouth daily. Swallow whole. [provider]  Active   atorvastatin  (LIPITOR) 80 MG tablet [id number] Yes Take 80 mg by mouth at bedtime. [provider]  Active   blood glucose meter kit and supplies [id number]  Dispense based on patient and insurance preference. Use up to four times daily as directed. (FOR ICD-10 E10.9, E11.9). Check blood sugar twice a day as directed. Anthony Norene HERO, DO  Active Self  Continuous Glucose Receiver (FREESTYLE LIBRE 3 READER) DEVI [id number] Yes 1 each by Does not apply route in the morning, at noon, in the evening, and at bedtime. Use to check BGs E11.9 Anthony Norene M, DO  Active Self  Continuous Glucose Sensor (FREESTYLE LIBRE 3 SENSOR) MISC [id number] Yes PLACE 1 SENSOR ON THE SKIN EVERY 14 DAYS. USE TO CHECK GLUCOSE CONTINUOUSLY E11.9 Anthony Norene M, DO  Active   famotidine  (PEPCID  AC) 10 MG tablet [id number] Yes Take 1 tablet (10 mg total) by mouth every other day. **dose change for kidneys Anthony Norene M, DO  Active   glucose blood (ACCU-CHEK GUIDE) test strip [id number]  Test blood sugar 4 times per day, before meals and at bedtime. Anthony Benton PARAS, NP  Active Self    Discontinued 02/25/24 1102 (Discontinued by provider)   insulin  NPH-regular Human (70-30) 100 UNIT/ML injection [id number]  Inject 15-20 Units into the skin See admin instructions. Inject 20 units before breakfast and 15 units before supper  06/26/23 per pt inj 10 before breakfast and 5 units before supper per pt  Patient taking differently: Inject 5-10 Units into the skin See admin instructions. Inject 20 units before breakfast and 15 units before supper  06/26/23 per pt inj 10 before breakfast and 5 units before supper per pt   [provider]  Active Self           Med Note Anthony French, ROCKEY A   Tue Dec 02, 2022  9:24 AM)     Insulin  Pen Needle (PEN NEEDLES) 31G X 8 MM MISC [id number]  1 each by Does not apply route in the morning and at bedtime. Anthony Norene HERO, DO  Active Self  Insulin  Syringe-Needle U-100 (BD INSULIN  SYRINGE U/F) 31G X 5/16 0.3 ML MISC [id number]  USE WITH INSULIN  TWICE  DAILY DX E11.22 Anthony Benton PARAS, NP  Active   levothyroxine  (SYNTHROID ) 50 MCG tablet [id number]  Take 1 tablet (50 mcg total) by mouth daily before breakfast. Anthony Benton PARAS, NP  Active   metoprolol  tartrate (LOPRESSOR ) 25 MG tablet [id number]  Take 0.5 tablets (12.5 mg total) by mouth 2 (two) times daily. Anthony Norene M, DO  Active     Discontinued 02/25/24 1051 (Discontinued by provider)              Assessment/Plan:   Diabetes: - Currently uncontrolled; goal A1c <7%. Cardiorenal risk reduction is opportunities for improvement.. Blood pressure is at goal <130/80. LDL is at goal.  - Reviewed long term cardiovascular and renal outcomes  of uncontrolled blood sugar. - Recommend to continue insulin  mix 70/30 as prescribed by endocrine  - having hypoglycemia on HD days due to dietary changes; encouraged patient to decrease AM insulin  by 1-2 units on HD days when intake AM is decreased  - consider low dose GLP1, will reach out to Endocrine for recommendation/thoughts - Nifedipine, hydralazine  discontinued by [facility] (updated med list) - Aspirin changed med list to 81mg  daily (was previously on 325mg  daily) - Consider Repatha at follow up in 1 month; goal LDL <55 due to MI in 2025 and T2DM; currently on max dose atorvastatin    Follow Up Plan: 1 month with PharmD, PCP in 04/2024  Mliss Tarry Griffin, PharmD, BCACP, CPP Clinical Pharmacist, [facility]   "

## 2024-02-26 ENCOUNTER — Telehealth: Payer: Self-pay

## 2024-02-26 NOTE — Telephone Encounter (Signed)
-----   Message from Mliss JONETTA Griffin sent at 02/25/2024 11:05 AM EST ----- Hi!  This patient mentioned he is on HD MWF now and needs to change his 04/2024 appt and Selena Peeden's (significant other) to a Tuesday or Thursday due to HD schedule.  He and Selena Peeden both need back to back appts rescheduled.  I'm happy to route to front if needed!  Thanks! Julie

## 2024-02-26 NOTE — Telephone Encounter (Signed)
 Reached out to reschedule appointment per patient request. No answer, left message for patient to call back to offer appointment on 05/10/2024.

## 2024-03-03 ENCOUNTER — Ambulatory Visit

## 2024-03-03 ENCOUNTER — Encounter: Payer: Self-pay | Admitting: Family Medicine

## 2024-03-03 ENCOUNTER — Telehealth: Payer: Self-pay | Admitting: Licensed Clinical Social Worker

## 2024-03-03 VITALS — BP 125/81 | HR 52 | Temp 98.1°F | Ht 69.0 in | Wt 179.8 lb

## 2024-03-03 DIAGNOSIS — H6691 Otitis media, unspecified, right ear: Secondary | ICD-10-CM

## 2024-03-03 DIAGNOSIS — E1122 Type 2 diabetes mellitus with diabetic chronic kidney disease: Secondary | ICD-10-CM | POA: Diagnosis not present

## 2024-03-03 DIAGNOSIS — Z794 Long term (current) use of insulin: Secondary | ICD-10-CM | POA: Diagnosis not present

## 2024-03-03 DIAGNOSIS — N186 End stage renal disease: Secondary | ICD-10-CM

## 2024-03-03 DIAGNOSIS — Z992 Dependence on renal dialysis: Secondary | ICD-10-CM

## 2024-03-03 MED ORDER — LEVOFLOXACIN 500 MG PO TABS: 500.0000 mg | ORAL_TABLET | Freq: Every day | ORAL | 0 refills | Status: AC

## 2024-03-03 MED ORDER — ATORVASTATIN CALCIUM 80 MG PO TABS: 80.0000 mg | ORAL_TABLET | Freq: Every day | ORAL | 0 refills | Status: AC

## 2024-03-03 NOTE — Patient Outreach (Signed)
 Complex Care Management   Visit Note  03/03/2024  Name:  Anthony French MRN: [id number] DOB: Feb 03, 1956  Situation: Referral received for Complex Care Management related to SDOH Barriers:  Financial Resource Strain I obtained verbal consent from Patient.  Visit completed with Patient  on the phone  Background:   Past Medical History:  Diagnosis Date   Diabetes (HCC)    Diabetic neuropathy (HCC)    Essential hypertension, benign 06/01/2019   Hyperlipidemia     Assessment: Patient Reported Symptoms:  Cognitive Cognitive Status: Able to follow simple commands, Alert and oriented to person, place, and time, Normal speech and language skills Cognitive/Intellectual Conditions Management [RPT]: None reported or documented in medical history or problem list   Health Maintenance Behaviors: Annual physical exam Healing Pattern: Average Health Facilitated by: Rest  Neurological      HEENT HEENT Symptoms Reported: Ear pain, Sore throat HEENT Management Strategies: Adequate rest, Coping strategies, Medication therapy HEENT Self-Management Outcome: 2 (bad) HEENT Comment: Going in for an office visit today    Cardiovascular Cardiovascular Symptoms Reported: No symptoms reported Does patient have uncontrolled Hypertension?: Yes Is patient checking Blood Pressure at home?: Yes Cardiovascular Management Strategies: Coping strategies, Routine screening  Respiratory Respiratory Symptoms Reported: Productive cough Respiratory Management Strategies: Adequate rest, Routine screening  Endocrine Endocrine Symptoms Reported: No symptoms reported Is patient diabetic?: Yes Is patient checking blood sugars at home?: Yes Endocrine Self-Management Outcome: 4 (good)  Gastrointestinal Gastrointestinal Symptoms Reported: No symptoms reported      Genitourinary Genitourinary Symptoms Reported: No symptoms reported    Integumentary Integumentary Symptoms Reported: Not assessed    Musculoskeletal  Musculoskelatal Symptoms Reviewed: No symptoms reported        Psychosocial Psychosocial Symptoms Reported: No symptoms reported Behavioral Management Strategies: Activity, Coping strategies Behavioral Health Self-Management Outcome: 4 (good) Major Change/Loss/Stressor/Fears (CP): Denies Quality of Family Relationships: helpful, involved, supportive Do you feel physically threatened by others?: No    03/03/2024    PHQ2-9 Depression Screening   Little interest or pleasure in doing things Not at all  Feeling down, depressed, or hopeless Not at all  PHQ-2 - Total Score 0  Trouble falling or staying asleep, or sleeping too much    Feeling tired or having little energy    Poor appetite or overeating     Feeling bad about yourself - or that you are a failure or have let yourself or your family down    Trouble concentrating on things, such as reading the newspaper or watching television    Moving or speaking so slowly that other people could have noticed.  Or the opposite - being so fidgety or restless that you have been moving around a lot more than usual    Thoughts that you would be better off dead, or hurting yourself in some way    PHQ2-9 Total Score    If you checked off any problems, how difficult have these problems made it for you to do your work, take care of things at home, or get along with other people    Depression Interventions/Treatment      There were no vitals filed for this visit.    Medications Reviewed Today     Reviewed by Merlynn Lyle CROME, LCSW (Social Worker) on 03/03/24 at 1523  Med List Status: <None>   Medication Order Taking? Sig Documenting Provider Last Dose Status Informant  aspirin EC 81 MG tablet [id number] Yes Take 81 mg by mouth daily. Swallow whole. [provider]  Active   atorvastatin  (LIPITOR) 80 MG tablet [id number]  Take 1 tablet (80 mg total) by mouth at bedtime. Joesph Annabella HERO, FNP  Active   blood glucose meter kit and supplies  [id number] Yes Dispense based on patient and insurance preference. Use up to four times daily as directed. (FOR ICD-10 E10.9, E11.9). Check blood sugar twice a day as directed. [name], DO  Active Self  Continuous Glucose Receiver (FREESTYLE LIBRE 3 READER) DEVI [id number] Yes 1 each by Does not apply route in the morning, at noon, in the evening, and at bedtime. Use to check BGs E11.9 Jolinda Norene M, DO  Active Self  Continuous Glucose Sensor (FREESTYLE LIBRE 3 SENSOR) MISC [id number] Yes PLACE 1 SENSOR ON THE SKIN EVERY 14 DAYS. USE TO CHECK GLUCOSE CONTINUOUSLY E11.9 Jolinda Norene M, DO  Active   famotidine  (PEPCID  AC) 10 MG tablet [id number] Yes Take 1 tablet (10 mg total) by mouth every other day. **dose change for kidneys Jolinda Norene M, DO  Active   glucose blood (ACCU-CHEK GUIDE) test strip [id number] Yes Test blood sugar 4 times per day, before meals and at bedtime. Therisa Benton PARAS, NP  Active Self  insulin  NPH-regular Human (70-30) 100 UNIT/ML injection [id number] Yes Inject 15-20 Units into the skin See admin instructions. Inject 20 units before breakfast and 15 units before supper  06/26/23 per pt inj 10 before breakfast and 5 units before supper per pt [provider]  Active Self           Med Note JERALYN, ROCKEY A   Tue Dec 02, 2022  9:24 AM)    Insulin  Pen Needle (PEN NEEDLES) 31G X 8 MM MISC [id number] Yes 1 each by Does not apply route in the morning and at bedtime. Jolinda Norene M, DO  Active Self  levofloxacin  (LEVAQUIN ) 500 MG tablet [id number]  Take 1 tablet (500 mg total) by mouth daily for 7 days. Joesph Annabella HERO, FNP  Active   levothyroxine  (SYNTHROID ) 50 MCG tablet [id number] Yes Take 1 tablet (50 mcg total) by mouth daily before breakfast. Therisa Benton PARAS, NP  Active   metoprolol  tartrate (LOPRESSOR ) 25 MG tablet [id number] Yes Take 0.5 tablets (12.5 mg total) by mouth 2 (two) times daily. [name], DO  Active              Recommendation:   PCP Follow-up Continue Current Plan of Care Keep in close contact with Dialysis SW Leigh at [facility]  Follow Up Plan:   Telephone follow-up in 1 month  Lyle Rung, BSW, MSW, LCSW Licensed Clinical Social Worker [employer]   [email or web address] Direct Dial : [phone]

## 2024-03-03 NOTE — Progress Notes (Signed)
 "  Acute Office Visit  Subjective:     Patient ID: Anthony French, male    DOB: 06-Aug-1955, 69 y.o.   MRN: [id number]  Chief Complaint  Patient presents with   Otalgia    HPI  History of Present Illness   Anthony French is a 69 year old male with a history of ear infections who presents with right ear pain for ten days.  Otalgia - Right ear pain for ten days - Pain is constant and has progressively worsened - Pain is severe enough to disturb sleep, especially when lying on the right side - No relief with over-the-counter medications such as Tylenol   Pharyngalgia - Throat pain described as internal - Pain sometimes resembles a toothache, despite absence of teeth in the affected area  Constitutional and respiratory symptoms - No fever - No cough - No congestion     He is requesting a refill on atorvastatin  today.   ROS As per HPI.     Objective:    BP 125/81   Pulse (!) 52   Temp 98.1 F (36.7 C) (Temporal)   Ht 5' 9 (1.753 m)   Wt 179 lb 12.8 oz (81.6 kg)   SpO2 99%   BMI 26.55 kg/m    Physical Exam Vitals and nursing note reviewed.  Constitutional:      General: He is not in acute distress.    Appearance: He is not ill-appearing, toxic-appearing or diaphoretic.  HENT:     Right Ear: Ear canal and external ear normal. Tympanic membrane is erythematous.     Left Ear: Ear canal and external ear normal.     Ears:     Comments: Unable to visualize full right TM due to hair.     Nose: No congestion.     Mouth/Throat:     Mouth: Mucous membranes are moist.     Pharynx: Oropharynx is clear. No oropharyngeal exudate or posterior oropharyngeal erythema.  Eyes:     General:        Right eye: No discharge.        Left eye: No discharge.  Pulmonary:     Effort: No respiratory distress.  Musculoskeletal:     Cervical back: Neck supple. No rigidity.  Skin:    General: Skin is warm and dry.  Neurological:     Mental Status: He is alert and oriented to person,  place, and time. Mental status is at baseline.  Psychiatric:        Mood and Affect: Mood normal.        Behavior: Behavior normal.     No results found for any visits on 03/03/24.      Assessment & Plan:   Genie was seen today for otalgia.  Diagnoses and all orders for this visit:  Acute right otitis media -     levofloxacin  (LEVAQUIN ) 500 MG tablet; Take 1 tablet (500 mg total) by mouth daily for 7 days.  Type 2 diabetes mellitus with chronic kidney disease on chronic dialysis, with long-term current use of insulin  (HCC) -     atorvastatin  (LIPITOR) 80 MG tablet; Take 1 tablet (80 mg total) by mouth at bedtime.  Assessment and Plan    Acute right otitis media Right ear infection with pain and tenderness. Allergic to penicillin and cephalosporins. - Prescribed levofloxacin  500 mg once daily for 7 days. - Advised pain should improve after a couple of doses of antibiotics.  Type 2 diabetes mellitus with ESRD - Refilled  atorvastatin  prescription.      Return to office for new or worsening symptoms, or if symptoms persist.   The patient indicates understanding of these issues and agrees with the plan.  Annabella CHRISTELLA Search, FNP   "

## 2024-03-03 NOTE — Patient Instructions (Signed)
 Visit Information  Thank you for taking time to visit with me today. Please don't hesitate to contact me if I can be of assistance to you before our next scheduled appointment.  Our next appointment is by telephone on 03/17/24 at 245 pm Please call the care guide team at [phone] if you need to cancel or reschedule your appointment.   Following is a copy of your care plan:   Goals Addressed             This Visit's Progress    LCSW VBCI Social Work Care Plan       Current barriers:   SDOH Barriers Need for Financial Assistance and connection to available dialysis resources  Clinical Goals: Patient will work with agencies/resources/PCP/[facility] SW to address needs related to gaining resources to help promote the safety and health of patient.  Clinical Interventions:  Inter-disciplinary care team collaboration (see longitudinal plan of care) Patient provided patient history.  Assessment of needs, progress, barriers completed. Advised patient to answer all calls from care providers and to keep phone nearby. Advised parents to contact 911 or 988 if a crisis occurs.  Clinical interventions provided:Solution-Focused Strategies, Active listening / Reflection utilized , Problem Solving /Task Center ,  Patient's main support network includes spouse and two daughters Voice message left for Leigh the SW at [facility]   Patient Goals/Self-Care Activities: Call provider office for new concerns or questions Consider bumping up self-care  Call 988 in case of an emergencies   Plan:   The care management team will reach out to the patient again over the next 30 days.        Please call the Suicide and Crisis Lifeline: 988 call the [place]  National Suicide Prevention Lifeline: (650)782-6790 or TTY: 743-220-9206 TTY 612-525-9105) to talk to a trained counselor call 1-800-273-TALK (toll free, 24 hour hotline) go to [facility] [address] [place] [phone]) call the [facility] Crisis Line: [phone] call 911 if you are experiencing a Mental Health or Behavioral Health Crisis or need someone to talk to.  Patient verbalized understanding of Care plan and visit instructions communicated this visit  Lyle Rung, BSW, MSW, LCSW Licensed Clinical Social Worker [employer]   [email or web address] Direct Dial : [phone]

## 2024-03-04 ENCOUNTER — Encounter: Payer: Self-pay | Admitting: Licensed Clinical Social Worker

## 2024-03-04 NOTE — Patient Instructions (Signed)
 Visit Information  Our next appointment is by telephone on 03/17/24 at 2:45 pm Please call the care guide team at [phone] if you need to cancel or reschedule your appointment.   Following is a copy of your care plan:   Goals Addressed             This Visit's Progress    LCSW VBCI Social Work Care Plan       Current barriers:   SDOH Barriers Need for Financial Assistance and connection to available dialysis resources  Clinical Goals: Patient will work with agencies/resources/PCP/[facility] SW to address needs related to gaining resources to help promote the safety and health of patient.  Clinical Interventions:  Inter-disciplinary care team collaboration (see longitudinal plan of care) Patient provided patient history.  Assessment of needs, progress, barriers completed. Advised patient to answer all calls from care providers and to keep phone nearby. Advised parents to contact 911 or 988 if a crisis occurs.  Clinical interventions provided:Solution-Focused Strategies, Active listening / Reflection utilized , Problem Solving /Task Center ,  Patient's main support network includes spouse and two daughters Voice message left for Anette the SW at [facility] , update - 03/04/24 VBCI LCSW received return message from [facility] stating that she checked with their financial department and patient is fully covered for dialysis treatment there through his new [facility]. Patient previously had [employer] and now has [employer] but both plans are medicare advantage and there are no issues with coverage per program.  Patient Goals/Self-Care Activities: Call provider office for new concerns or questions Consider bumping up self-care  Call 988 in case of an emergencies   Plan:   The care management team will reach out to the patient again over the next 30 days.        Please call the Suicide and Crisis Lifeline: 988 call the [place]  National  Suicide Prevention Lifeline: 2890498826 or TTY: 712-178-1019 TTY 3158030278) to talk to a trained counselor call 1-800-273-TALK (toll free, 24 hour hotline) go to [facility] [address] [place] [phone]) call the [facility] Crisis Line: [phone] call 911 if you are experiencing a Mental Health or Behavioral Health Crisis or need someone to talk to.  Patient verbalized understanding of Care plan and visit instructions communicated this visit  Lyle Rung, BSW, MSW, LCSW Licensed Clinical Social Worker [employer]   [email or web address] Direct Dial : [phone]

## 2024-03-17 ENCOUNTER — Telehealth: Payer: Self-pay

## 2024-03-17 ENCOUNTER — Telehealth: Admitting: Licensed Clinical Social Worker

## 2024-03-17 NOTE — Progress Notes (Unsigned)
 Complex Care Management Care Guide Note  03/17/2024 Name: Anthony French MRN: [id number] DOB: 03/08/1955  Anthony French is a 69 y.o. year old male who is a primary care patient of [name], DO and is actively engaged with the care management team. I reached out to [name] by phone today to assist with re-scheduling  with the Licensed Clinical Child Psychotherapist.  Follow up plan: Unsuccessful telephone outreach attempt made. A HIPAA compliant phone message was left for the patient providing contact information and requesting a return call.  Debbe Fuse [facility]  [facility], [facility] Guide  Direct Dial : [phone]  Fax [phone]

## 2024-03-29 ENCOUNTER — Other Ambulatory Visit

## 2024-04-18 ENCOUNTER — Ambulatory Visit: Payer: Self-pay | Admitting: Family Medicine

## 2024-04-19 ENCOUNTER — Ambulatory Visit: Admitting: Family Medicine

## 2024-06-02 ENCOUNTER — Ambulatory Visit: Admitting: Nurse Practitioner

## 2024-10-21 ENCOUNTER — Encounter: Payer: Self-pay | Admitting: Family Medicine
